# Patient Record
Sex: Female | Born: 1950 | Hispanic: No | Marital: Married | Smoking: Never smoker
Health system: Southern US, Community
[De-identification: ages and names within clinical notes are randomized; demographics above are authoritative.]

## PROBLEM LIST (undated history)

## (undated) DIAGNOSIS — M1990 Unspecified osteoarthritis, unspecified site: Secondary | ICD-10-CM

## (undated) DIAGNOSIS — K219 Gastro-esophageal reflux disease without esophagitis: Secondary | ICD-10-CM

## (undated) DIAGNOSIS — G8929 Other chronic pain: Secondary | ICD-10-CM

## (undated) DIAGNOSIS — C50911 Malignant neoplasm of unspecified site of right female breast: Secondary | ICD-10-CM

## (undated) DIAGNOSIS — R0602 Shortness of breath: Secondary | ICD-10-CM

## (undated) DIAGNOSIS — C50919 Malignant neoplasm of unspecified site of unspecified female breast: Secondary | ICD-10-CM

## (undated) DIAGNOSIS — R7303 Prediabetes: Secondary | ICD-10-CM

## (undated) DIAGNOSIS — M549 Dorsalgia, unspecified: Secondary | ICD-10-CM

## (undated) DIAGNOSIS — I10 Essential (primary) hypertension: Secondary | ICD-10-CM

## (undated) DIAGNOSIS — R011 Cardiac murmur, unspecified: Secondary | ICD-10-CM

## (undated) DIAGNOSIS — K635 Polyp of colon: Secondary | ICD-10-CM

## (undated) HISTORY — PX: TONSILLECTOMY: SUR1361

## (undated) HISTORY — DX: Unspecified osteoarthritis, unspecified site: M19.90

## (undated) HISTORY — PX: FOOT OSTEOTOMY: SHX957

## (undated) HISTORY — DX: Malignant neoplasm of unspecified site of right female breast: C50.911

## (undated) HISTORY — DX: Cardiac murmur, unspecified: R01.1

## (undated) HISTORY — DX: Essential (primary) hypertension: I10

## (undated) HISTORY — DX: Prediabetes: R73.03

## (undated) HISTORY — DX: Polyp of colon: K63.5

## (undated) HISTORY — PX: CHOLECYSTECTOMY: SHX55

## (undated) HISTORY — PX: COLONOSCOPY: SHX174

## (undated) HISTORY — DX: Shortness of breath: R06.02

---

## 1998-10-25 ENCOUNTER — Encounter: Admission: RE | Admit: 1998-10-25 | Discharge: 1998-11-11 | Payer: Self-pay | Admitting: Podiatry

## 2003-06-16 ENCOUNTER — Encounter: Admission: RE | Admit: 2003-06-16 | Discharge: 2003-06-16 | Payer: Self-pay | Admitting: Gastroenterology

## 2003-06-16 ENCOUNTER — Encounter: Payer: Self-pay | Admitting: Gastroenterology

## 2003-11-01 ENCOUNTER — Encounter: Admission: RE | Admit: 2003-11-01 | Discharge: 2003-11-30 | Payer: Self-pay | Admitting: Neurology

## 2006-04-08 ENCOUNTER — Ambulatory Visit (HOSPITAL_BASED_OUTPATIENT_CLINIC_OR_DEPARTMENT_OTHER): Admission: RE | Admit: 2006-04-08 | Discharge: 2006-04-08 | Payer: Self-pay | Admitting: Ophthalmology

## 2012-04-10 ENCOUNTER — Encounter: Payer: Self-pay | Admitting: Advanced Practice Midwife

## 2012-04-10 ENCOUNTER — Ambulatory Visit (INDEPENDENT_AMBULATORY_CARE_PROVIDER_SITE_OTHER): Payer: BC Managed Care – PPO | Admitting: Advanced Practice Midwife

## 2012-04-10 ENCOUNTER — Encounter: Payer: Self-pay | Admitting: Physician Assistant

## 2012-04-10 VITALS — BP 171/90 | HR 84 | Temp 97.7°F | Ht 60.0 in | Wt 143.2 lb

## 2012-04-10 DIAGNOSIS — M1990 Unspecified osteoarthritis, unspecified site: Secondary | ICD-10-CM

## 2012-04-10 DIAGNOSIS — I10 Essential (primary) hypertension: Secondary | ICD-10-CM

## 2012-04-10 DIAGNOSIS — Z01419 Encounter for gynecological examination (general) (routine) without abnormal findings: Secondary | ICD-10-CM

## 2012-04-10 DIAGNOSIS — M129 Arthropathy, unspecified: Secondary | ICD-10-CM

## 2012-04-10 NOTE — Patient Instructions (Signed)
Pap Test A Pap test is a sampling of cells from a woman's cervix. The cervix is the opening between the vagina (birth canal) and the uterus (the bottom part of the womb). The cells are scraped from the cervix during a pelvic exam. These cells are then looked at under a microscope to see if the cells are normal or to see if a cancer is developing or there are changes that suggest a cancer will develop. Cervical dysplasia is a condition in which a woman has abnormal changes in the top layer of cells of her cervix. These changes are an early sign that cervical cancer may develop. Pap tests also look for the human papilloma virus (HPV) because it has 4 types that are responsible for 70% of cervical cancer. Infections can also be found during a Pap test such as bacteria, fungus, protozoa and viruses.  Cervical cancer is harder to treat and less likely to have a good outcome if left untreated. Catching the disease at an early stage leads to a better outcome. Since the Pap test was introduced 60 years ago, deaths from cervical cancer have decreased by 70%. Every woman should keep up to date with Pap tests. RISK FACTORS FOR CERVICAL CANCER INCLUDE:   Becoming sexually active before age 18.   Being the daughter of a woman who took diethylstilbestrol (DES) during pregnancy.   Having a sexual partner who has or has had cancer of the penis.   Having a sexual partner whose past partner had cervical cancer or cervical dysplasia (early cell changes which suggest a cancer may develop).   Having a weakened immune system. An example would be HIV or other immunodeficiency disorder.   Having had a sexually transmitted infection such as chlamydia, gonorrhea or HPV.   Having had an abnormal Pap or cancer of the vagina or vulva.   Having had more than one sexual partner.   A history of cervical cancer in a woman's sister or mother.   Not using condoms with new sexual partners.   Smoking.  WHO SHOULD HAVE PAP  TESTS  A Pap test is done to screen for cervical cancer.   The first Pap test should be done at age 21.   Between ages 21 and 29, Pap tests are repeated every 2 years.   Beginning at age 30, you are advised to have a Pap test every 3 years as long as your past 3 Pap tests have been normal.   Some women have medical problems that increase the chance of getting cervical cancer. Talk to your caregiver about these problems. It is especially important to talk to your caregiver if a new problem develops soon after your last Pap test. In these cases, your caregiver may recommend more frequent screening and Pap tests.   The above recommendations are the same for women who have or have not gotten the vaccine for HPV (Human Papillomavirus).   If you had a hysterectomy for a problem that was not a cancer or a condition that could lead to cancer, then you no longer need Pap tests. However, even if you no longer need a Pap test, a regular exam is a good idea to make sure no other problems are starting.    If you are between ages 65 and 70, and you have had normal Pap tests going back 10 years, you no longer need Pap tests. However, even if you no longer need a Pap test, a regular exam is a good idea   to make sure no other problems are starting.    If you have had past treatment for cervical cancer or a condition that could lead to cancer, you need Pap tests and screening for cancer for at least 20 years after your treatment.   If Pap tests have been discontinued, risk factors (such as a new sexual partner) need to be re-assessed to determine if screening should be resumed.   Some women may need screenings more often if they are at high risk for cervical cancer.  PREPARATION FOR A PAP TEST A Pap test should be performed during the weeks before the start of menstruation. Women should not douche or have sexual intercourse for 24 hours before the test. No vaginal creams, diaphragms, or tampons should be  used for 24 hours before the test. To minimize discomfort, a woman should empty her bladder just before the exam. TAKING THE PAP TEST The caregiver will perform a pelvic exam. A metal or plastic instrument (speculum) is placed in the vagina. This is done before your caregiver does a bimanual exam of your internal female organs. This instrument allows your caregiver to see the inside of the vagina and look at the cervix. A small, sterile brush is used to take a sample of cells from the internal opening of the cervix. A small wooden spatula is used to scrape the outside of the cervix. Neither of these two methods to collect cells will cause you pain. These two scrapings are placed on a glass slide or in a small bottle filled with a special liquid. The cells are looked at later under a microscope in a lab. A specialist will look at these cells and determine if the cells are normal. RESULTS OF YOUR PAP TEST  A healthy Pap test shows no abnormal cells or evidence of inflammation.   The presence of abnormally growing cells on the surface of the cervix may be reported as an abnormal Pap test. Different categories of findings are used to describe your Pap test. Your caregiver will go over the importance of these findings with you. The caregiver will then determine what follow-up is needed or when you should have your next pap test.   If you have had two or more abnormal Pap tests:   You may be asked to have a colposcopy. This is a test in which the cervix is viewed with a special lighted microscope.   A cervical tissue sample (biopsy) may also be needed. This involves taking a small tissue sample from the cervix. The sample is looked at under a microscope to find the cause of the abnormal cells. Make sure you find out the results of the Pap test. If you have not received the results within two weeks, contact your caregiver's office for the results. Do not assume everything is normal if you have not heard from  your caregiver or medical facility. It is important to follow up on all of your test results.  Document Released: 01/05/2003 Document Revised: 10/04/2011 Document Reviewed: 10/09/2011 [employer] Patient Information 2012 [employer]. 

## 2012-04-11 ENCOUNTER — Encounter: Payer: Self-pay | Admitting: Advanced Practice Midwife

## 2012-04-11 NOTE — Progress Notes (Signed)
  Subjective:    Ashley Pratt is a 61 y.o. female who presents for an annual exam. The patient has no complaints today. The patient is not sexually active. GYN screening history: last pap: patient does not recall results of last pap. The patient wears seatbelts: yes. The patient participates in regular exercise: not asked. Has the patient ever been transfused or tattooed?: not asked. The patient reports that there is not domestic violence in her life.   Patient is followed by Medical doctor for her chronic health problems. She does not want a Mammogram "I had one a few years ago and don't want another".  Menstrual History: OB History    Grav Para Term Preterm Abortions TAB SAB Ect Mult Living   4 2 2  2  2   2        No LMP recorded. Patient is postmenopausal.    The following portions of the patient's history were reviewed and updated as appropriate: allergies, current medications, past family history, past medical history, past social history, past surgical history and problem list.  Review of Systems Pertinent items are noted in HPI.    Objective:    General appearance: alert, cooperative and no distress Throat: lips, mucosa, and tongue normal; teeth and gums normal Neck: no adenopathy, supple, symmetrical, trachea midline and thyroid not enlarged, symmetric, no tenderness/mass/nodules Lungs: clear to auscultation bilaterally Breasts: normal appearance, no masses or tenderness Heart: regular rate and rhythm, S1, S2 normal, no murmur, click, rub or gallop Abdomen: soft, non-tender; bowel sounds normal; no masses,  no organomegaly Pelvic: cervix normal in appearance, external genitalia normal, no adnexal masses or tenderness, no cervical motion tenderness, rectovaginal septum normal, uterus normal size, shape, and consistency and vagina normal without discharge.   Moderate atrophy noted Pap done  Assessment:    Healthy female exam.    Plan:     All questions  answered. Await pap smear results. Breast self exam technique reviewed and patient encouraged to perform self-exam monthly. Follow up in 1 year.  Declines Mammogram.

## 2012-04-21 ENCOUNTER — Encounter: Payer: Self-pay | Admitting: *Deleted

## 2012-10-29 HISTORY — PX: BREAST EXCISIONAL BIOPSY: SUR124

## 2013-01-06 ENCOUNTER — Other Ambulatory Visit: Payer: Self-pay | Admitting: Internal Medicine

## 2013-01-06 DIAGNOSIS — Z1231 Encounter for screening mammogram for malignant neoplasm of breast: Secondary | ICD-10-CM

## 2013-01-27 ENCOUNTER — Ambulatory Visit
Admission: RE | Admit: 2013-01-27 | Discharge: 2013-01-27 | Disposition: A | Discharge status: Home / Self Care | Payer: BC Managed Care – PPO | Source: Ambulatory Visit | Attending: Internal Medicine | Admitting: Internal Medicine

## 2013-01-27 ENCOUNTER — Other Ambulatory Visit: Payer: Self-pay | Admitting: Internal Medicine

## 2013-01-27 DIAGNOSIS — Z1231 Encounter for screening mammogram for malignant neoplasm of breast: Secondary | ICD-10-CM

## 2013-01-30 ENCOUNTER — Other Ambulatory Visit: Payer: Self-pay | Admitting: Internal Medicine

## 2013-01-30 DIAGNOSIS — R928 Other abnormal and inconclusive findings on diagnostic imaging of breast: Secondary | ICD-10-CM

## 2013-02-16 ENCOUNTER — Other Ambulatory Visit: Payer: BC Managed Care – PPO

## 2013-02-20 ENCOUNTER — Other Ambulatory Visit: Payer: Self-pay | Admitting: Internal Medicine

## 2013-02-20 ENCOUNTER — Ambulatory Visit
Admission: RE | Admit: 2013-02-20 | Discharge: 2013-02-20 | Disposition: A | Discharge status: Home / Self Care | Payer: BC Managed Care – PPO | Source: Ambulatory Visit | Attending: Internal Medicine | Admitting: Internal Medicine

## 2013-02-20 DIAGNOSIS — R928 Other abnormal and inconclusive findings on diagnostic imaging of breast: Secondary | ICD-10-CM

## 2013-02-26 ENCOUNTER — Ambulatory Visit
Admission: RE | Admit: 2013-02-26 | Discharge: 2013-02-26 | Disposition: A | Discharge status: Home / Self Care | Payer: BC Managed Care – PPO | Source: Ambulatory Visit | Attending: Internal Medicine | Admitting: Internal Medicine

## 2013-02-26 ENCOUNTER — Other Ambulatory Visit: Payer: Self-pay | Admitting: Internal Medicine

## 2013-02-26 DIAGNOSIS — R928 Other abnormal and inconclusive findings on diagnostic imaging of breast: Secondary | ICD-10-CM

## 2013-02-27 ENCOUNTER — Other Ambulatory Visit: Payer: Self-pay | Admitting: Internal Medicine

## 2013-02-27 DIAGNOSIS — R928 Other abnormal and inconclusive findings on diagnostic imaging of breast: Secondary | ICD-10-CM

## 2013-03-10 ENCOUNTER — Ambulatory Visit
Admission: RE | Admit: 2013-03-10 | Discharge: 2013-03-10 | Disposition: A | Discharge status: Home / Self Care | Payer: BC Managed Care – PPO | Source: Ambulatory Visit | Attending: Internal Medicine | Admitting: Internal Medicine

## 2013-03-10 ENCOUNTER — Other Ambulatory Visit: Payer: Self-pay | Admitting: Internal Medicine

## 2013-03-10 DIAGNOSIS — R928 Other abnormal and inconclusive findings on diagnostic imaging of breast: Secondary | ICD-10-CM

## 2013-03-16 ENCOUNTER — Telehealth (INDEPENDENT_AMBULATORY_CARE_PROVIDER_SITE_OTHER): Payer: Self-pay

## 2013-03-16 ENCOUNTER — Ambulatory Visit (INDEPENDENT_AMBULATORY_CARE_PROVIDER_SITE_OTHER): Payer: Self-pay | Admitting: General Surgery

## 2013-03-16 NOTE — Telephone Encounter (Signed)
LM on husband's VM.  Pt to call back and reschedule 03/16/13 no show.

## 2013-03-30 ENCOUNTER — Ambulatory Visit (INDEPENDENT_AMBULATORY_CARE_PROVIDER_SITE_OTHER): Payer: Self-pay | Admitting: General Surgery

## 2013-04-27 ENCOUNTER — Encounter (INDEPENDENT_AMBULATORY_CARE_PROVIDER_SITE_OTHER): Payer: Self-pay | Admitting: General Surgery

## 2013-04-27 ENCOUNTER — Ambulatory Visit (INDEPENDENT_AMBULATORY_CARE_PROVIDER_SITE_OTHER): Payer: BC Managed Care – PPO | Admitting: General Surgery

## 2013-04-27 VITALS — BP 128/84 | HR 61 | Temp 97.4°F | Resp 16 | Ht 60.0 in | Wt 143.0 lb

## 2013-04-27 DIAGNOSIS — D0500 Lobular carcinoma in situ of unspecified breast: Secondary | ICD-10-CM | POA: Insufficient documentation

## 2013-04-27 DIAGNOSIS — C50919 Malignant neoplasm of unspecified site of unspecified female breast: Secondary | ICD-10-CM

## 2013-04-27 DIAGNOSIS — D0501 Lobular carcinoma in situ of right breast: Secondary | ICD-10-CM

## 2013-04-27 NOTE — Progress Notes (Signed)
Chief complaint:  Right breast LCIS.  HISTORY: Pt is a 62 yo female who had abnormal mammogram.  Core Needle biopsy was performed and demonstrated LCIS with extensive necrosis and calcifications.  She has never needed a breast biopsy before.  She has had 2 children, the first at age 26.  She had menopause around age 82.  She denies family or personal history of cancer.  She denies nipple discharge or retraction.  She has not had breast pain other than with biopsy.  She is not on any hormones.    Past Medical History  Diagnosis Date  . Hypertension   . Heart murmur     Past Surgical History  Procedure Laterality Date  . Cholecystectomy      Current Outpatient Prescriptions  Medication Sig Dispense Refill  . amoxicillin (AMOXIL) 500 MG capsule       . chlorthalidone (HYGROTON) 25 MG tablet Take 25 mg by mouth daily.      Marland Kitchen losartan (COZAAR) 25 MG tablet Take 25 mg by mouth daily.       No current facility-administered medications for this visit.     Allergies  Allergen Reactions  . Aspirin      No family history on file.   History   Social History  . Marital Status: Married    Spouse Name: N/A    Number of Children: N/A  . Years of Education: N/A   Social History Main Topics  . Smoking status: Never Smoker   . Smokeless tobacco: Never Used  . Alcohol Use: No  . Drug Use: No  . Sexually Active: None   REVIEW OF SYSTEMS - PERTINENT POSITIVES ONLY: 12 point review of systems negative other than HPI and PMH  EXAM: Filed Vitals:   04/27/13 1121  BP: 128/84  Pulse: 61  Temp: 97.4 F (36.3 C)  Resp: 16   Filed Weights   04/27/13 1121  Weight: 143 lb (64.864 kg)    Gen:  No acute distress.  Well nourished and well groomed.   Neurological: Alert and oriented to person, place, and time. Coordination normal.  Head: Normocephalic and atraumatic.  Eyes: Conjunctivae are normal. Pupils are equal, round, and reactive to light. No scleral icterus.  Neck: Normal  range of motion. Neck supple. No tracheal deviation or thyromegaly present.  Cardiovascular: Normal rate, regular rhythm, normal heart sounds and intact distal pulses.  Exam reveals no gallop and no friction rub.  No murmur heard. Breasts:  No palpable masses or tenderness.  No nipple discharge or retraction.  Biopsy site seen at 9 oclock.   Respiratory: Effort normal.  No respiratory distress. No chest wall tenderness. Breath sounds normal.  No wheezes, rales or rhonchi.  GI: Soft. Bowel sounds are normal. The abdomen is soft and nontender.  There is no rebound and no guarding.  Musculoskeletal: Normal range of motion. Extremities are nontender.  Lymphadenopathy: No cervical, preauricular, postauricular or axillary adenopathy is present Skin: Skin is warm and dry. No rash noted. No diaphoresis. No erythema. No pallor. No clubbing, cyanosis, or edema.   Psychiatric: Normal mood and affect. Behavior is normal. Judgment and thought content normal.    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  Images and reports are reviewed. Diagnostic mammogram.  Magnification views of the outer portion of the left  breast  demonstrate coarse calcifications felt to be benign dystrophic  calcifications. There are several partially circumscribed  partially obscured round nodules in the left lower outer quadrant.  Mammographic images were processed with CAD.  On physical exam, no mass is palpated in either breast.  Ultrasound is performed, showing a cyst at 1 o'clock 3 cm from the  left nipple measuring 8 x 10 x 5 mm. There are two cysts in the  left lower outer quadrant, one at 4 o'clock 5 cm from the left  nipple measuring 7 x 7 x 5 mm and one at 6 o'clock 6 cm from the  left nipple measuring 6 x 4 x  4 mm.  On the right at 11 o'clock, 4 cm from the right nipple, there is an  ill-defined hypoechoic area in a ductal orientation that may  contain calcifications. This measures 2.1 x 0.7 x 2.2 cm. At the  distal end of this hypoechoic area, there is another hypoechoic  area that may contain calcifications, 6 cm from the right nipple at  11 o'clock measuring 9 x 4 x 6 mm. Findings are concerning for  possible carcinoma.  I would suggest ultrasound-guided core needle biopsy of the  hypoechoic area at 11 o'clock 4 cm from the right nipple. This  could be followed by stereotactic core needle biopsy of the  calcifications. These procedures were discussed with the patient  and her husband. They could not stay today for these procedures.  This has been scheduled for 02/26/2013 at 2:00 p.m.  IMPRESSION:  Suspicious hypoechoic mass at 11 o'clock, 4 cm from the right  nipple with suspicious calcifications in the outer portion of the  right breast.  RECOMMENDATION:  Suggest ultrasound-guided core needle biopsy of the hypoechoic area  and stereotactic core needle biopsy of the calcifications. This  has been scheduled for 02/26/2013 at 2:00 p.m.  I have discussed the findings and recommendations with the patient.  Results were also provided in writing at the conclusion of the  visit. If applicable, a reminder letter will be sent to the  patient regarding the next appointment.  BI-RADS CATEGORY 4: Suspicious abnormality - biopsy should be  considered.    ASSESSMENT AND PLAN: Breast neoplasm, Tis (LCIS) Given extensive necrosis and calcifications associated with LCIS, will plan needle localization.  Reviewed surgery, risks and benefits with patient.    Discussed bleeding, infection, seroma, damage to adjacent structures, possible diagnosis of cancer, possible need for further procedures or surgery.  Advised not to take aspirin or blood thinners 1 week before surgery.       Maudry Diego  MD Surgical Oncology, General and Endocrine Surgery [facility]      Visit Diagnoses: 1. Breast neoplasm, Tis (LCIS), right     Primary Care Physician: Georgann Housekeeper, MD

## 2013-04-27 NOTE — Patient Instructions (Signed)
IF YOU ARE TAKING ASPIRIN, COUMADIN/WARFARIN, PLAVIX, OR OTHER BLOOD THINNER, PLEASE LET US KNOW IMMEDIATELY.  WE WILL NEED TO DISCUSS WITH THE PRESCRIBING PROVIDER IF THESE ARE SAFE TO STOP. IF THESE ARE NOT STOPPED AT THE APPROPRIATE TIME, THIS WILL RESULT IN A DELAY FOR YOUR SURGERY.  DO NOT TAKE THESE MEDICATIONS OR IBUPROFEN/NAPROXEN WITHIN A WEEK BEFORE SURGERY.   The main risks of surgery are bleeding, infection, damage to other structures, and seroma (accumulation of fluid) under the incision site(s).    These complications may lead to additional procedures such as drainage of seroma/infection.  If cancer is found, you may need other surgeries to obtain negative margins or to take more lymph nodes.   Most women do accumulate fluid in the breast cavity where the specimen was removed. We do not always have to drain this fluid.  If your breast is very tense, painful, or red, then we may need to numb the skin and use a needle to aspirate the fluid.  We do provide patients with a Breast Binder.  The purpose of this is to avoid the use of tape on the sensitive tissue of the breast and to provide some compression to minimize the risk of seroma.  If the binder is uncomfortable, you may find that a tank top with a built-in shelf bra or a loose sports bra works better for you.  I recommend wearing this around the clock for the first 1-2 weeks except in the shower.    You may remove your dressings and may shower 48 hours after surgery.    Many patients have some constipation in the week after surgery due to the narcotics and anesthesia.  You may need over the counter stool softeners or laxatives if you experience difficulty having bowel movements.    If the following occur, call our office at [phone]: If you have a fever over 101 or pain that is severe despite narcotics. If you have redness or drainage at the wound. If you develop persistent nausea or vomiting.  I will follow you back up in  1-4 weeks.    Please submit any paperwork about time off work/insurance forms to the front desk.      

## 2013-04-27 NOTE — Assessment & Plan Note (Signed)
Given extensive necrosis and calcifications associated with LCIS, will plan needle localization.  Reviewed surgery, risks and benefits with patient.    Discussed bleeding, infection, seroma, damage to adjacent structures, possible diagnosis of cancer, possible need for further procedures or surgery.  Advised not to take aspirin or blood thinners 1 week before surgery.

## 2013-05-06 ENCOUNTER — Encounter (HOSPITAL_BASED_OUTPATIENT_CLINIC_OR_DEPARTMENT_OTHER): Payer: Self-pay | Admitting: *Deleted

## 2013-05-06 NOTE — Progress Notes (Signed)
Spoke with husband-says wife speaks english fairly well-but will get interpretor-to come in for ekg-bmet-

## 2013-05-07 ENCOUNTER — Encounter (HOSPITAL_BASED_OUTPATIENT_CLINIC_OR_DEPARTMENT_OTHER)
Admission: RE | Admit: 2013-05-07 | Discharge: 2013-05-07 | Disposition: A | Discharge status: Home / Self Care | Payer: BC Managed Care – PPO | Source: Ambulatory Visit | Attending: General Surgery | Admitting: General Surgery

## 2013-05-07 ENCOUNTER — Other Ambulatory Visit: Payer: Self-pay

## 2013-05-07 LAB — BASIC METABOLIC PANEL
BUN: 13 mg/dL (ref 6–23)
CO2: 28 mEq/L (ref 19–32)
Chloride: 98 mEq/L (ref 96–112)
Creatinine, Ser: 0.86 mg/dL (ref 0.50–1.10)
Glucose, Bld: 146 mg/dL — ABNORMAL HIGH (ref 70–99)

## 2013-05-11 ENCOUNTER — Encounter (HOSPITAL_BASED_OUTPATIENT_CLINIC_OR_DEPARTMENT_OTHER): Payer: Self-pay | Admitting: Certified Registered Nurse Anesthetist

## 2013-05-11 ENCOUNTER — Ambulatory Visit (HOSPITAL_BASED_OUTPATIENT_CLINIC_OR_DEPARTMENT_OTHER): Payer: BC Managed Care – PPO | Admitting: Certified Registered Nurse Anesthetist

## 2013-05-11 ENCOUNTER — Ambulatory Visit
Admission: RE | Admit: 2013-05-11 | Discharge: 2013-05-11 | Disposition: A | Discharge status: Home / Self Care | Payer: BC Managed Care – PPO | Source: Ambulatory Visit | Attending: General Surgery | Admitting: General Surgery

## 2013-05-11 ENCOUNTER — Ambulatory Visit (HOSPITAL_BASED_OUTPATIENT_CLINIC_OR_DEPARTMENT_OTHER)
Admission: RE | Admit: 2013-05-11 | Discharge: 2013-05-11 | Disposition: A | Discharge status: Home / Self Care | Payer: BC Managed Care – PPO | Source: Ambulatory Visit | Attending: General Surgery | Admitting: General Surgery

## 2013-05-11 ENCOUNTER — Encounter (HOSPITAL_BASED_OUTPATIENT_CLINIC_OR_DEPARTMENT_OTHER)
Admission: RE | Disposition: A | Discharge status: Home / Self Care | Payer: Self-pay | Source: Ambulatory Visit | Attending: General Surgery

## 2013-05-11 DIAGNOSIS — R011 Cardiac murmur, unspecified: Secondary | ICD-10-CM | POA: Insufficient documentation

## 2013-05-11 DIAGNOSIS — D0501 Lobular carcinoma in situ of right breast: Secondary | ICD-10-CM

## 2013-05-11 DIAGNOSIS — I10 Essential (primary) hypertension: Secondary | ICD-10-CM | POA: Insufficient documentation

## 2013-05-11 DIAGNOSIS — D4860 Neoplasm of uncertain behavior of unspecified breast: Secondary | ICD-10-CM

## 2013-05-11 DIAGNOSIS — M129 Arthropathy, unspecified: Secondary | ICD-10-CM | POA: Insufficient documentation

## 2013-05-11 DIAGNOSIS — Z780 Asymptomatic menopausal state: Secondary | ICD-10-CM | POA: Insufficient documentation

## 2013-05-11 DIAGNOSIS — D0590 Unspecified type of carcinoma in situ of unspecified breast: Secondary | ICD-10-CM | POA: Insufficient documentation

## 2013-05-11 DIAGNOSIS — Z79899 Other long term (current) drug therapy: Secondary | ICD-10-CM | POA: Insufficient documentation

## 2013-05-11 DIAGNOSIS — K219 Gastro-esophageal reflux disease without esophagitis: Secondary | ICD-10-CM | POA: Insufficient documentation

## 2013-05-11 HISTORY — PX: BREAST LUMPECTOMY WITH NEEDLE LOCALIZATION: SHX5759

## 2013-05-11 HISTORY — DX: Unspecified osteoarthritis, unspecified site: M19.90

## 2013-05-11 HISTORY — PX: BREAST LUMPECTOMY: SHX2

## 2013-05-11 HISTORY — DX: Gastro-esophageal reflux disease without esophagitis: K21.9

## 2013-05-11 HISTORY — DX: Other chronic pain: G89.29

## 2013-05-11 HISTORY — DX: Dorsalgia, unspecified: M54.9

## 2013-05-11 LAB — POCT HEMOGLOBIN-HEMACUE: Hemoglobin: 13.9 g/dL (ref 12.0–15.0)

## 2013-05-11 SURGERY — BREAST LUMPECTOMY WITH NEEDLE LOCALIZATION
Anesthesia: General | Site: Breast | Laterality: Right | Wound class: Clean

## 2013-05-11 MED ORDER — SODIUM CHLORIDE 0.9 % IJ SOLN: 3.0000 mL | INTRAMUSCULAR | Status: DC | PRN

## 2013-05-11 MED ORDER — FENTANYL CITRATE 0.05 MG/ML IJ SOLN
INTRAMUSCULAR | Status: DC | PRN
  Administered 2013-05-11: 50 ug via INTRAVENOUS
  Administered 2013-05-11: 25 ug via INTRAVENOUS

## 2013-05-11 MED ORDER — LIDOCAINE HCL (CARDIAC) 20 MG/ML IV SOLN
INTRAVENOUS | Status: DC | PRN
  Administered 2013-05-11: 50 mg via INTRAVENOUS

## 2013-05-11 MED ORDER — ACETAMINOPHEN 650 MG RE SUPP: 650.0000 mg | RECTAL | Status: DC | PRN

## 2013-05-11 MED ORDER — PROPOFOL 10 MG/ML IV BOLUS
INTRAVENOUS | Status: DC | PRN
  Administered 2013-05-11: 150 mg via INTRAVENOUS

## 2013-05-11 MED ORDER — OXYCODONE HCL 5 MG/5ML PO SOLN: 5.0000 mg | Freq: Once | ORAL | Status: AC | PRN

## 2013-05-11 MED ORDER — FENTANYL CITRATE 0.05 MG/ML IJ SOLN: 25.0000 ug | INTRAMUSCULAR | Status: DC | PRN

## 2013-05-11 MED ORDER — OXYCODONE HCL 5 MG PO TABS: 5.0000 mg | ORAL_TABLET | ORAL | Status: DC | PRN

## 2013-05-11 MED ORDER — SCOPOLAMINE 1 MG/3DAYS TD PT72
1.0000 | MEDICATED_PATCH | TRANSDERMAL | Status: DC
  Administered 2013-05-11: 1.5 mg via TRANSDERMAL

## 2013-05-11 MED ORDER — ONDANSETRON HCL 4 MG/2ML IJ SOLN
INTRAMUSCULAR | Status: DC | PRN
  Administered 2013-05-11: 4 mg via INTRAVENOUS

## 2013-05-11 MED ORDER — HYDROCODONE-ACETAMINOPHEN 5-325 MG PO TABS: 1.0000 | ORAL_TABLET | ORAL | Status: DC | PRN

## 2013-05-11 MED ORDER — ACETAMINOPHEN 325 MG PO TABS: 650.0000 mg | ORAL_TABLET | ORAL | Status: DC | PRN

## 2013-05-11 MED ORDER — SODIUM CHLORIDE 0.9 % IJ SOLN: 3.0000 mL | Freq: Two times a day (BID) | INTRAMUSCULAR | Status: DC

## 2013-05-11 MED ORDER — OXYCODONE HCL 5 MG PO TABS
5.0000 mg | ORAL_TABLET | Freq: Once | ORAL | Status: AC | PRN
  Administered 2013-05-11: 5 mg via ORAL

## 2013-05-11 MED ORDER — DEXAMETHASONE SODIUM PHOSPHATE 4 MG/ML IJ SOLN
INTRAMUSCULAR | Status: DC | PRN
  Administered 2013-05-11: 10 mg via INTRAVENOUS

## 2013-05-11 MED ORDER — LIDOCAINE-EPINEPHRINE (PF) 1 %-1:200000 IJ SOLN
INTRAMUSCULAR | Status: DC | PRN
  Administered 2013-05-11: 16:00:00

## 2013-05-11 MED ORDER — SODIUM CHLORIDE 0.9 % IV SOLN: 250.0000 mL | INTRAVENOUS | Status: DC | PRN

## 2013-05-11 MED ORDER — CEFAZOLIN SODIUM-DEXTROSE 2-3 GM-% IV SOLR
2.0000 g | INTRAVENOUS | Status: AC
  Administered 2013-05-11: 2 g via INTRAVENOUS

## 2013-05-11 MED ORDER — LACTATED RINGERS IV SOLN
INTRAVENOUS | Status: DC
  Administered 2013-05-11: 13:00:00 via INTRAVENOUS

## 2013-05-11 MED ORDER — EPHEDRINE SULFATE 50 MG/ML IJ SOLN
INTRAMUSCULAR | Status: DC | PRN
  Administered 2013-05-11: 10 mg via INTRAVENOUS

## 2013-05-11 MED ORDER — ONDANSETRON HCL 4 MG/2ML IJ SOLN: 4.0000 mg | Freq: Four times a day (QID) | INTRAMUSCULAR | Status: DC | PRN

## 2013-05-11 MED ORDER — MIDAZOLAM HCL 5 MG/5ML IJ SOLN
INTRAMUSCULAR | Status: DC | PRN
  Administered 2013-05-11: 1 mg via INTRAVENOUS

## 2013-05-11 MED ORDER — CHLORHEXIDINE GLUCONATE 4 % EX LIQD: 1.0000 "application " | Freq: Once | CUTANEOUS | Status: DC

## 2013-05-11 SURGICAL SUPPLY — 47 items
BLADE HEX COATED 2.75 (ELECTRODE) ×2 IMPLANT
BLADE SURG 15 STRL LF DISP TIS (BLADE) ×1 IMPLANT
BLADE SURG 15 STRL SS (BLADE) ×2
CANISTER SUCTION 1200CC (MISCELLANEOUS) ×2 IMPLANT
CHLORAPREP W/TINT 26ML (MISCELLANEOUS) ×2 IMPLANT
CLIP TI LARGE 6 (CLIP) ×1 IMPLANT
CLOTH BEACON ORANGE TIMEOUT ST (SAFETY) ×2 IMPLANT
COVER MAYO STAND STRL (DRAPES) ×2 IMPLANT
COVER TABLE BACK 60X90 (DRAPES) ×2 IMPLANT
DECANTER SPIKE VIAL GLASS SM (MISCELLANEOUS) IMPLANT
DEVICE DUBIN W/COMP PLATE 8390 (MISCELLANEOUS) ×2 IMPLANT
DRAPE PED LAPAROTOMY (DRAPES) ×2 IMPLANT
DRAPE UTILITY XL STRL (DRAPES) ×2 IMPLANT
ELECT REM PT RETURN 9FT ADLT (ELECTROSURGICAL) ×2
ELECTRODE REM PT RTRN 9FT ADLT (ELECTROSURGICAL) ×1 IMPLANT
GAUZE SPONGE 4X4 12PLY STRL LF (GAUZE/BANDAGES/DRESSINGS) ×2 IMPLANT
GLOVE BIO SURGEON STRL SZ 6 (GLOVE) ×2 IMPLANT
GLOVE BIOGEL PI IND STRL 6.5 (GLOVE) ×1 IMPLANT
GLOVE BIOGEL PI IND STRL 7.0 (GLOVE) IMPLANT
GLOVE BIOGEL PI INDICATOR 6.5 (GLOVE) ×1
GLOVE BIOGEL PI INDICATOR 7.0 (GLOVE) ×1
GLOVE ECLIPSE 6.5 STRL STRAW (GLOVE) ×1 IMPLANT
GOWN PREVENTION PLUS XLARGE (GOWN DISPOSABLE) ×2 IMPLANT
GOWN PREVENTION PLUS XXLARGE (GOWN DISPOSABLE) ×2 IMPLANT
KIT MARKER MARGIN INK (KITS) IMPLANT
NDL HYPO 25X1 1.5 SAFETY (NEEDLE) ×1 IMPLANT
NEEDLE HYPO 25X1 1.5 SAFETY (NEEDLE) ×2 IMPLANT
NS IRRIG 1000ML POUR BTL (IV SOLUTION) ×2 IMPLANT
PACK BASIN DAY SURGERY FS (CUSTOM PROCEDURE TRAY) ×2 IMPLANT
PENCIL BUTTON HOLSTER BLD 10FT (ELECTRODE) ×2 IMPLANT
SLEEVE SCD COMPRESS KNEE MED (MISCELLANEOUS) ×2 IMPLANT
SPONGE GAUZE 4X4 16PLY UNSTER (WOUND CARE) ×4 IMPLANT
SPONGE LAP 18X18 X RAY DECT (DISPOSABLE) ×2 IMPLANT
STAPLER VISISTAT 35W (STAPLE) IMPLANT
STRIP CLOSURE SKIN 1/2X4 (GAUZE/BANDAGES/DRESSINGS) ×2 IMPLANT
SUT MON AB 4-0 PC3 18 (SUTURE) ×2 IMPLANT
SUT SILK 2 0 SH (SUTURE) IMPLANT
SUT VIC AB 3-0 54X BRD REEL (SUTURE) IMPLANT
SUT VIC AB 3-0 BRD 54 (SUTURE)
SUT VIC AB 3-0 SH 27 (SUTURE) ×2
SUT VIC AB 3-0 SH 27X BRD (SUTURE) ×1 IMPLANT
SYR BULB 3OZ (MISCELLANEOUS) ×2 IMPLANT
SYR CONTROL 10ML LL (SYRINGE) ×2 IMPLANT
TOWEL OR 17X24 6PK STRL BLUE (TOWEL DISPOSABLE) ×2 IMPLANT
TOWEL OR NON WOVEN STRL DISP B (DISPOSABLE) ×2 IMPLANT
TUBE CONNECTING 20X1/4 (TUBING) ×2 IMPLANT
YANKAUER SUCT BULB TIP NO VENT (SUCTIONS) ×2 IMPLANT

## 2013-05-11 NOTE — Anesthesia Postprocedure Evaluation (Signed)
Anesthesia Post Note  Patient: Ashley Pratt  Procedure(s) Performed: Procedure(s) (LRB): RIGHT BREAST NEEDLE LOCALIZATION  LUMPECTOMY (Right)  Anesthesia type: General  Patient location: PACU  Post pain: Pain level controlled and Adequate analgesia  Post assessment: Post-op Vital signs reviewed, Patient's Cardiovascular Status Stable, Respiratory Function Stable, Patent Airway and Pain level controlled  Last Vitals:  Filed Vitals:   05/11/13 1615  BP: 155/85  Pulse: 93  Temp:   Resp: 13    Post vital signs: Reviewed and stable  Level of consciousness: awake, alert  and oriented  Complications: No apparent anesthesia complications

## 2013-05-11 NOTE — Transfer of Care (Signed)
Immediate Anesthesia Transfer of Care Note  Patient: Ashley Pratt  Procedure(s) Performed: Procedure(s): RIGHT BREAST NEEDLE LOCALIZATION  LUMPECTOMY (Right)  Patient Location: PACU  Anesthesia Type:General  Level of Consciousness: awake and sedated  Airway & Oxygen Therapy: Patient Spontanous Breathing and Patient connected to face mask oxygen  Post-op Assessment: Report given to PACU RN and Post -op Vital signs reviewed and stable  Post vital signs: Reviewed and stable  Complications: No apparent anesthesia complications

## 2013-05-11 NOTE — Op Note (Signed)
Right breast needle localized lumpectomy  Indications: This patient presents with history of a right breast mass. Given the clinical history and physical exam, along with indicated diagnostic studies, breast biopsy will be performed.  Pre-operative Diagnosis: right breast mass  Post-operative Diagnosis: right breast mass  Surgeon: [email or web address]   Assistants: none  Anesthesia: General LMA anesthesia and Local anesthesia 1% plain lidocaine, 0.25.% bupivacaine, with epinephrine  ASA Class: 2  Procedure Details  The patient was seen in the Holding Room. The risks, benefits, complications, treatment options, and expected outcomes were discussed with the patient. The possibilities of reaction to medication, pulmonary aspiration, bleeding, infection, the need for additional procedures, failure to diagnose a condition, and creating a complication requiring transfusion or operation were discussed with the patient. The patient concurred with the proposed plan, giving informed consent. The site of surgery properly noted/marked. The patient was taken to Operating Room, identified as LAQUINTA HAZELL, and the procedure verified as lumpectomy. A Time Out was held and the above information confirmed.  After induction of anesthesia, the right breast and chest were prepped and draped in standard fashion. The lumpectomy was performed by creating an transverse incision over the lateral aspect of the breast. Thick flaps were created superiorly and anteriorly.  The wire was dissected out and held in place with a hemostat while the wire was pulled back under the skin.  Allis clamps were used to elevate the specimen.  The cautery and the curved mayo scissors were used to dissect out the specimen.  Orientation sutures were placed, long lateral and short superior, and hemostasis was achieved with cautery. The wound was irrigated and closed with a 3-0 Vicryl deep dermal and 4-0 monocryl subcuticular closure in layers.     Sterile dressings were applied. At the end of the operation, all sponge, instrument, and needle counts were correct.  Findings: grossly clear surgical margins  Estimated Blood Loss:  Minimal         Specimens:  Right breast lumpectomy             Complications:  None; patient tolerated the procedure well.         Disposition: PACU - hemodynamically stable.         Condition: Stable

## 2013-05-11 NOTE — Interval H&P Note (Signed)
History and Physical Interval Note:  05/11/2013 2:54 PM  Ashley Pratt  has presented today for surgery, with the diagnosis of right LCIS with necrosis  The various methods of treatment have been discussed with the patient and family. After consideration of risks, benefits and other options for treatment, the patient has consented to  Procedure(s): RIGHT BREAST NEEDLE LOCALIZATION  LUMPECTOMY (Right) as a surgical intervention .  The patient's history has been reviewed, patient examined, no change in status, stable for surgery.  I have reviewed the patient's chart and labs.  Questions were answered to the patient's satisfaction.     [name]

## 2013-05-11 NOTE — Anesthesia Preprocedure Evaluation (Addendum)
Anesthesia Evaluation  Patient identified by MRN, date of birth, ID band Patient awake    Reviewed: Allergy & Precautions, H&P , NPO status , Patient's Chart, lab work & pertinent test results  Airway Mallampati: II  Neck ROM: full    Dental   Pulmonary          Cardiovascular hypertension, + Valvular Problems/Murmurs     Neuro/Psych    GI/Hepatic GERD-  Medicated,  Endo/Other    Renal/GU      Musculoskeletal  (+) Arthritis -,   Abdominal   Peds  Hematology   Anesthesia Other Findings   Reproductive/Obstetrics                          Anesthesia Physical Anesthesia Plan  ASA: II  Anesthesia Plan: General   Post-op Pain Management:    Induction: Intravenous  Airway Management Planned: LMA  Additional Equipment:   Intra-op Plan:   Post-operative Plan:   Informed Consent: I have reviewed the patients History and Physical, chart, labs and discussed the procedure including the risks, benefits and alternatives for the proposed anesthesia with the patient or authorized representative who has indicated his/her understanding and acceptance.     Plan Discussed with: CRNA, Anesthesiologist and Surgeon  Anesthesia Plan Comments:         Anesthesia Quick Evaluation

## 2013-05-11 NOTE — H&P (View-Only) (Signed)
Chief complaint:  Right breast LCIS.  HISTORY: Pt is a 61 yo female who had abnormal mammogram.  Core Needle biopsy was performed and demonstrated LCIS with extensive necrosis and calcifications.  She has never needed a breast biopsy before.  She has had 2 children, the first at age 27.  She had menopause around age 41.  She denies family or personal history of cancer.  She denies nipple discharge or retraction.  She has not had breast pain other than with biopsy.  She is not on any hormones.    Past Medical History  Diagnosis Date  . Hypertension   . Heart murmur     Past Surgical History  Procedure Laterality Date  . Cholecystectomy      Current Outpatient Prescriptions  Medication Sig Dispense Refill  . amoxicillin (AMOXIL) 500 MG capsule       . chlorthalidone (HYGROTON) 25 MG tablet Take 25 mg by mouth daily.      . losartan (COZAAR) 25 MG tablet Take 25 mg by mouth daily.       No current facility-administered medications for this visit.     Allergies  Allergen Reactions  . Aspirin      No family history on file.   History   Social History  . Marital Status: Married    Spouse Name: N/A    Number of Children: N/A  . Years of Education: N/A   Social History Main Topics  . Smoking status: Never Smoker   . Smokeless tobacco: Never Used  . Alcohol Use: No  . Drug Use: No  . Sexually Active: None   REVIEW OF SYSTEMS - PERTINENT POSITIVES ONLY: 12 point review of systems negative other than HPI and PMH  EXAM: Filed Vitals:   04/27/13 1121  BP: 128/84  Pulse: 61  Temp: 97.4 F (36.3 C)  Resp: 16   Filed Weights   04/27/13 1121  Weight: 143 lb (64.864 kg)    Gen:  No acute distress.  Well nourished and well groomed.   Neurological: Alert and oriented to person, place, and time. Coordination normal.  Head: Normocephalic and atraumatic.  Eyes: Conjunctivae are normal. Pupils are equal, round, and reactive to light. No scleral icterus.  Neck: Normal  range of motion. Neck supple. No tracheal deviation or thyromegaly present.  Cardiovascular: Normal rate, regular rhythm, normal heart sounds and intact distal pulses.  Exam reveals no gallop and no friction rub.  No murmur heard. Breasts:  No palpable masses or tenderness.  No nipple discharge or retraction.  Biopsy site seen at 9 oclock.   Respiratory: Effort normal.  No respiratory distress. No chest wall tenderness. Breath sounds normal.  No wheezes, rales or rhonchi.  GI: Soft. Bowel sounds are normal. The abdomen is soft and nontender.  There is no rebound and no guarding.  Musculoskeletal: Normal range of motion. Extremities are nontender.  Lymphadenopathy: No cervical, preauricular, postauricular or axillary adenopathy is present Skin: Skin is warm and dry. No rash noted. No diaphoresis. No erythema. No pallor. No clubbing, cyanosis, or edema.   Psychiatric: Normal mood and affect. Behavior is normal. Judgment and thought content normal.    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  Images and reports are reviewed. Diagnostic mammogram.  Magnification views of the outer portion of the left   breast  demonstrate coarse calcifications felt to be benign dystrophic  calcifications. There are several partially circumscribed  partially obscured round nodules in the left lower outer quadrant.  Mammographic images were processed with CAD.  On physical exam, no mass is palpated in either breast.  Ultrasound is performed, showing a cyst at 1 o'clock 3 cm from the  left nipple measuring 8 x 10 x 5 mm. There are two cysts in the  left lower outer quadrant, one at 4 o'clock 5 cm from the left  nipple measuring 7 x 7 x 5 mm and one at 6 o'clock 6 cm from the  left nipple measuring 6 x 4 x  4 mm.  On the right at 11 o'clock, 4 cm from the right nipple, there is an  ill-defined hypoechoic area in a ductal orientation that may  contain calcifications. This measures 2.1 x 0.7 x 2.2 cm. At the  distal end of this hypoechoic area, there is another hypoechoic  area that may contain calcifications, 6 cm from the right nipple at  11 o'clock measuring 9 x 4 x 6 mm. Findings are concerning for  possible carcinoma.  I would suggest ultrasound-guided core needle biopsy of the  hypoechoic area at 11 o'clock 4 cm from the right nipple. This  could be followed by stereotactic core needle biopsy of the  calcifications. These procedures were discussed with the patient  and her husband. They could not stay today for these procedures.  This has been scheduled for 02/26/2013 at 2:00 p.m.  IMPRESSION:  Suspicious hypoechoic mass at 11 o'clock, 4 cm from the right  nipple with suspicious calcifications in the outer portion of the  right breast.  RECOMMENDATION:  Suggest ultrasound-guided core needle biopsy of the hypoechoic area  and stereotactic core needle biopsy of the calcifications. This  has been scheduled for 02/26/2013 at 2:00 p.m.  I have discussed the findings and recommendations with the patient.  Results were also provided in writing at the conclusion of the  visit. If applicable, a reminder letter will be sent to the  patient regarding the next appointment.  BI-RADS CATEGORY 4: Suspicious abnormality - biopsy should be  considered.    ASSESSMENT AND PLAN: Breast neoplasm, Tis (LCIS) Given extensive necrosis and calcifications associated with LCIS, will plan needle localization.  Reviewed surgery, risks and benefits with patient.    Discussed bleeding, infection, seroma, damage to adjacent structures, possible diagnosis of cancer, possible need for further procedures or surgery.  Advised not to take aspirin or blood thinners 1 week before surgery.       Rosalene Wardrop L Brunella Wileman  MD Surgical Oncology, General and Endocrine Surgery [facility]      Visit Diagnoses: 1. Breast neoplasm, Tis (LCIS), right     Primary Care Physician: HUSAIN,KARRAR, MD    

## 2013-05-11 NOTE — Anesthesia Procedure Notes (Signed)
Procedure Name: LMA Insertion Performed by: Sherida Dobkins W Pre-anesthesia Checklist: Patient identified, Timeout performed, Emergency Drugs available, Suction available and Patient being monitored Patient Re-evaluated:Patient Re-evaluated prior to inductionOxygen Delivery Method: Circle system utilized Preoxygenation: Pre-oxygenation with 100% oxygen Intubation Type: IV induction Ventilation: Mask ventilation without difficulty LMA: LMA inserted LMA Size: 4.0 Number of attempts: 1 Placement Confirmation: breath sounds checked- equal and bilateral and positive ETCO2 Tube secured with: Tape Dental Injury: Teeth and Oropharynx as per pre-operative assessment      

## 2013-05-12 ENCOUNTER — Encounter (HOSPITAL_BASED_OUTPATIENT_CLINIC_OR_DEPARTMENT_OTHER): Payer: Self-pay | Admitting: General Surgery

## 2013-05-14 ENCOUNTER — Telehealth (INDEPENDENT_AMBULATORY_CARE_PROVIDER_SITE_OTHER): Payer: Self-pay

## 2013-05-14 NOTE — Telephone Encounter (Signed)
LMOV pt to call.  Pathology shows no invasive cancer.

## 2013-05-22 ENCOUNTER — Telehealth (INDEPENDENT_AMBULATORY_CARE_PROVIDER_SITE_OTHER): Payer: Self-pay

## 2013-05-22 NOTE — Telephone Encounter (Signed)
Dr. Biagio Quint called asking for me to call patient due to he had called asking for non emergency assistance . Husband States he is  asking for extended time to be off for his wife  I express the office is closed he need to call Monday 8:30 am. He verbalized understanding

## 2013-05-26 ENCOUNTER — Ambulatory Visit (INDEPENDENT_AMBULATORY_CARE_PROVIDER_SITE_OTHER): Payer: BC Managed Care – PPO | Admitting: General Surgery

## 2013-05-26 ENCOUNTER — Encounter (INDEPENDENT_AMBULATORY_CARE_PROVIDER_SITE_OTHER): Payer: Self-pay | Admitting: General Surgery

## 2013-05-26 ENCOUNTER — Other Ambulatory Visit (INDEPENDENT_AMBULATORY_CARE_PROVIDER_SITE_OTHER): Payer: Self-pay | Admitting: General Surgery

## 2013-05-26 VITALS — BP 144/80 | HR 64 | Resp 16 | Ht 60.0 in | Wt 141.6 lb

## 2013-05-26 DIAGNOSIS — D0502 Lobular carcinoma in situ of left breast: Secondary | ICD-10-CM

## 2013-05-26 DIAGNOSIS — C50919 Malignant neoplasm of unspecified site of unspecified female breast: Secondary | ICD-10-CM

## 2013-05-26 DIAGNOSIS — D0501 Lobular carcinoma in situ of right breast: Secondary | ICD-10-CM

## 2013-05-26 NOTE — Patient Instructions (Signed)
Follow up in 12 months.    Will make referral to Dr. Welton Flakes.  Get 1 year mammogram.

## 2013-05-26 NOTE — Assessment & Plan Note (Signed)
Will make referral to Dr. Welton Flakes given LCIS diagnosis.   Follow up with mammogram and exam in 1 year.

## 2013-05-26 NOTE — Progress Notes (Signed)
HISTORY: Patient is doing well approximately 3 weeks status post right needle localized excisional biopsy. She did have LCIS present on the biopsy with no evidence of invasive cancer. She is still a little sore but is not taking any analgesics or narcotics.  She denies fevers and chills. Her mobility is back to normal.    EXAM: General:  Alert and oriented Incision:  Healing well.  No erythema or drainage.     PATHOLOGY: LCIS   ASSESSMENT AND PLAN:   Breast neoplasm LCIS (right) Will make referral to Dr. Welton Flakes given LCIS diagnosis.   Follow up with mammogram and exam in 1 year.        Maudry Diego, MD Surgical Oncology, General & Endocrine Surgery [facility]  Georgann Housekeeper, MD Georgann Housekeeper, MD

## 2013-06-03 ENCOUNTER — Telehealth (INDEPENDENT_AMBULATORY_CARE_PROVIDER_SITE_OTHER): Payer: Self-pay

## 2013-06-03 NOTE — Telephone Encounter (Signed)
The pt's husband called to check on the appointment with Dr Welton Flakes.  I see the referral in the system.  I told him I will send a message to the nurse and they will get a call back.  Kendall in referrals was notified.

## 2013-06-11 ENCOUNTER — Telehealth: Payer: Self-pay | Admitting: *Deleted

## 2013-06-11 NOTE — Telephone Encounter (Signed)
Pt's husband called and I confirmed 06/26/13 appt w/ him.  Mailed before appt letter & packet to pt.  Called Lauren at referring to make aware.  Took paperwork to Med [facility] to scan.

## 2013-06-25 ENCOUNTER — Other Ambulatory Visit: Payer: Self-pay | Admitting: *Deleted

## 2013-06-25 DIAGNOSIS — D0501 Lobular carcinoma in situ of right breast: Secondary | ICD-10-CM

## 2013-06-25 DIAGNOSIS — C50411 Malignant neoplasm of upper-outer quadrant of right female breast: Secondary | ICD-10-CM

## 2013-06-26 ENCOUNTER — Ambulatory Visit (HOSPITAL_BASED_OUTPATIENT_CLINIC_OR_DEPARTMENT_OTHER): Payer: BC Managed Care – PPO

## 2013-06-26 ENCOUNTER — Encounter: Payer: Self-pay | Admitting: Oncology

## 2013-06-26 ENCOUNTER — Ambulatory Visit (HOSPITAL_BASED_OUTPATIENT_CLINIC_OR_DEPARTMENT_OTHER): Payer: BC Managed Care – PPO | Admitting: Oncology

## 2013-06-26 ENCOUNTER — Other Ambulatory Visit (HOSPITAL_BASED_OUTPATIENT_CLINIC_OR_DEPARTMENT_OTHER): Payer: BC Managed Care – PPO | Admitting: Lab

## 2013-06-26 VITALS — BP 165/90 | HR 75 | Temp 98.3°F | Resp 20 | Ht 60.0 in | Wt 145.6 lb

## 2013-06-26 DIAGNOSIS — D0501 Lobular carcinoma in situ of right breast: Secondary | ICD-10-CM

## 2013-06-26 DIAGNOSIS — C50419 Malignant neoplasm of upper-outer quadrant of unspecified female breast: Secondary | ICD-10-CM

## 2013-06-26 DIAGNOSIS — C50411 Malignant neoplasm of upper-outer quadrant of right female breast: Secondary | ICD-10-CM

## 2013-06-26 DIAGNOSIS — D0590 Unspecified type of carcinoma in situ of unspecified breast: Secondary | ICD-10-CM

## 2013-06-26 LAB — COMPREHENSIVE METABOLIC PANEL (CC13)
ALT: 18 U/L (ref 0–55)
AST: 18 U/L (ref 5–34)
BUN: 11.2 mg/dL (ref 7.0–26.0)
CO2: 27 mEq/L (ref 22–29)
Calcium: 9.2 mg/dL (ref 8.4–10.4)
Chloride: 103 mEq/L (ref 98–109)
Creatinine: 0.8 mg/dL (ref 0.6–1.1)
Total Bilirubin: 0.38 mg/dL (ref 0.20–1.20)

## 2013-06-26 LAB — CBC WITH DIFFERENTIAL/PLATELET
BASO%: 0.3 % (ref 0.0–2.0)
Basophils Absolute: 0 10*3/uL (ref 0.0–0.1)
EOS%: 3.9 % (ref 0.0–7.0)
HCT: 36.7 % (ref 34.8–46.6)
HGB: 12 g/dL (ref 11.6–15.9)
LYMPH%: 29.9 % (ref 14.0–49.7)
MCH: 25.5 pg (ref 25.1–34.0)
MCHC: 32.7 g/dL (ref 31.5–36.0)
NEUT%: 59.6 % (ref 38.4–76.8)
Platelets: 211 10*3/uL (ref 145–400)
lymph#: 2.5 10*3/uL (ref 0.9–3.3)

## 2013-06-26 MED ORDER — EXEMESTANE 25 MG PO TABS: 25.0000 mg | ORAL_TABLET | Freq: Every day | ORAL | Status: DC

## 2013-06-26 NOTE — Progress Notes (Signed)
Checked in new patient with no financial issues. Didn't ask if poa/living will. Mail and phone only for communication.

## 2013-06-26 NOTE — Patient Instructions (Addendum)
We discussed your diagnosis, pathology  We discussed  Treatment options for prevention of future breast cancer risk  We use aromasin 25 mg daily for 5 years  We discussed the side effects  Exemestane tablets What is this medicine? EXEMESTANE (ex e MES tane) blocks the production of the hormone estrogen. Some types of breast cancer depend on estrogen to grow, and this medicine can stop tumor growth by blocking estrogen production. This medicine is for the treatment of breast cancer in postmenopausal women only. This medicine may be used for other purposes; ask your health care provider or pharmacist if you have questions. What should I tell my health care provider before I take this medicine? They need to know if you have any of these conditions: -an unusual or allergic reaction to exemestane, other medicines, foods, dyes, or preservatives -pregnant or trying to get pregnant -breast-feeding How should I use this medicine? Take this medicine by mouth with a glass of water. Follow the directions on the prescription label. Take your doses at regular intervals after a meal. Do not take your medicine more often than directed. Do not stop taking except on the advice of your doctor or health care professional. Contact your pediatrician regarding the use of this medicine in children. Special care may be needed. Overdosage: If you think you have taken too much of this medicine contact a poison control center or emergency room at once. NOTE: This medicine is only for you. Do not share this medicine with others. What if I miss a dose? If you miss a dose, take the next dose as usual. Do not try to make up the missed dose. Do not take double or extra doses. What may interact with this medicine? Do not take this medicine with any of the following medications: -female hormones, like estrogens and birth control pills This medicine may also interact with the following  medications: -androstenedione -phenytoin -rifabutin, rifampin, or rifapentine -St. John's Wort This list may not describe all possible interactions. Give your health care provider a list of all the medicines, herbs, non-prescription drugs, or dietary supplements you use. Also tell them if you smoke, drink alcohol, or use illegal drugs. Some items may interact with your medicine. What should I watch for while using this medicine? Visit your doctor or health care professional for regular checks on your progress. If you experience hot flashes or sweating while taking this medicine, avoid alcohol, smoking and drinks with caffeine. This may help to decrease these side effects. What side effects may I notice from receiving this medicine? Side effects that you should report to your doctor or health care professional as soon as possible: -any new or unusual symptoms -changes in vision -fever -leg or arm swelling -pain in bones, joints, or muscles -pain in hips, back, ribs, arms, shoulders, or legs Side effects that usually do not require medical attention (report to your doctor or health care professional if they continue or are bothersome): -difficulty sleeping -headache -hot flashes -sweating -unusually weak or tired This list may not describe all possible side effects. Call your doctor for medical advice about side effects. You may report side effects to FDA at 1-800-FDA-1088. Where should I keep my medicine? Keep out of the reach of children. Store at room temperature between 15 and 30 degrees C (59 and 86 degrees F). Throw away any unused medicine after the expiration date. NOTE: This sheet is a summary. It may not cover all possible information. If you have questions about this  medicine, talk to your doctor, pharmacist, or health care provider.  2013, Elsevier/Gold Standard. (02/17/2008 11:48:29 AM)

## 2013-07-06 ENCOUNTER — Telehealth: Payer: Self-pay | Admitting: Oncology

## 2013-07-12 NOTE — Progress Notes (Signed)
Ashley Pratt [id number] 30-Mar-1951 61 y.o. 07/12/2013 12:50 AM  CC  Ashley Housekeeper, MD [address] [place] [address] Dr. Everardo Beals REASON FOR CONSULTATION:  62 year old female with LCIS of the right breast. Being seen in medical oncology for discussion of future breast cancer risk reduction   STAGE:   LCIS  REFERRING PHYSICIAN: Dr. Everardo Beals  HISTORY OF PRESENT ILLNESS:  Ashley Pratt is a 62 y.o. female.  Medical history significant for hypertension and reflux.who presented with an abnormal mammogram in right breast. Core needle biopsy was performed that demonstrated LCIS with extensive necrosis and calcifications. Because of this she  Underwent right needle localized excisional biopsy that showed LCIS without any evidence of invasive cancer. Postoperatively she has done well without any problems. She was referred to me by Dr. Donell Beers for further adjuvant treatment for future breast cancer risk reduction.   Past Medical History: Past Medical History  Diagnosis Date  . Hypertension   . Heart murmur   . GERD (gastroesophageal reflux disease)   . Arthritis   . Chronic back pain     Past Surgical History: Past Surgical History  Procedure Laterality Date  . Cholecystectomy    . Foot osteotomy      both  feet  . Tonsillectomy    . Breast lumpectomy with needle localization Right 05/11/2013    Procedure: RIGHT BREAST NEEDLE LOCALIZATION  LUMPECTOMY;  Surgeon: Almond Lint, MD;  Location: [facility];  Service: General;  Laterality: Right;    Family History: No family history on file.  Social History History  Substance Use Topics  . Smoking status: Never Smoker   . Smokeless tobacco: Never Used  . Alcohol Use: No    Allergies: Allergies  Allergen Reactions  . Aspirin     Gi upset  . Penicillins     Not sure    Current Medications: Current Outpatient Prescriptions  Medication Sig Dispense Refill  . losartan  (COZAAR) 25 MG tablet Take 25 mg by mouth daily.      . ranitidine (ZANTAC) 150 MG capsule Take 150 mg by mouth 2 (two) times daily.      Marland Kitchen acetaminophen (TYLENOL) 325 MG tablet Take 650 mg by mouth every 6 (six) hours as needed for pain.      Marland Kitchen exemestane (AROMASIN) 25 MG tablet Take 1 tablet (25 mg total) by mouth daily after breakfast.  30 tablet  12   No current facility-administered medications for this visit.    OB/GYN History:patient had menarche at 83 or 47 first live birth was at 55 she underwent menopause around 28  Fertility Discussion: not applicable Prior History of Cancer: no  Health Maintenance:  Colonoscopy unknown Bone Density unknown Last PAP smear unknown  ECOG PERFORMANCE STATUS: 0 - Asymptomatic  Genetic Counseling/testing: no  REVIEW OF SYSTEMS:  A comprehensive review of systems was negative.  PHYSICAL EXAMINATION: Blood pressure 165/90, pulse 75, temperature 98.3 F (36.8 C), temperature source Oral, resp. rate 20, height 5' (1.524 m), weight 145 lb 9.6 oz (66.044 kg).  NFA:OZHYQ, healthy, no distress, well nourished and well developed SKIN: skin color, texture, turgor are normal HEAD: Normocephalic EYES: PERRLA, EOMI, Conjunctiva are pink and non-injected EARS: External ears normal OROPHARYNX:no exudate, no erythema and lips, buccal mucosa, and tongue normal  NECK: supple, no adenopathy LYMPH:  no palpable lymphadenopathy BREAST:right breast normal without mass, skin or nipple changes or axillary nodes, left breast normal without mass, skin or nipple  changes or axillary nodes LUNGS: clear to auscultation and percussion HEART: regular rate & rhythm ABDOMEN:abdomen soft, non-tender, normal bowel sounds and no masses or organomegaly BACK: Back symmetric, no curvature., No CVA tenderness EXTREMITIES:no edema, no clubbing, no cyanosis  NEURO: alert & oriented x 3 with fluent speech, no focal motor/sensory deficits, gait  normal     STUDIES/RESULTS: No results found.   LABS:    Chemistry      Component Value Date/Time   NA 138 06/26/2013 1456   NA 135 05/07/2013 1240   K 3.9 06/26/2013 1456   K 5.1 05/07/2013 1240   CL 98 05/07/2013 1240   CO2 27 06/26/2013 1456   CO2 28 05/07/2013 1240   BUN 11.2 06/26/2013 1456   BUN 13 05/07/2013 1240   CREATININE 0.8 06/26/2013 1456   CREATININE 0.86 05/07/2013 1240      Component Value Date/Time   CALCIUM 9.2 06/26/2013 1456   CALCIUM 9.6 05/07/2013 1240   ALKPHOS 60 06/26/2013 1456   AST 18 06/26/2013 1456   ALT 18 06/26/2013 1456   BILITOT 0.38 06/26/2013 1456      Lab Results  Component Value Date   WBC 8.5 06/26/2013   HGB 12.0 06/26/2013   HCT 36.7 06/26/2013   MCV 78.0* 06/26/2013   PLT 211 06/26/2013   PATHOLOGY: Diagnosis Breast, lumpectomy, Right - LOBULAR CARCINOMA IN SITU WITH CALCIFICATIONS, PARTIALLY INVOLVING AN INTRADUCTAL PAPILLOMA. - FIBROCYSTIC CHANGES WITH CALCIFICATIONS. - HEALING BIOPSY SITE. - SEE COMMENT. Microscopic Comment Immunohistochemical stains for smooth muscle myosin, calponin, p63 and cytokeratin AE1/AE3 fail to highlight the presence of invasive carcinoma. A cytokeratin 5/6 stain is negative for the presence of atypical ductal hyperplasia. The surgical resection margin(s) of the specimen were inked and microscopically evaluated. (JBK:caf 05/14/13)  ASSESSMENT    62 year old female with  #1 new diagnosis of LCIS on a biopsy done subsequently patient is status post right needle localized excisional biopsy that only revealed LCIS no evidence of invasive cancer. Postoperatively she is doing well.  #2 patient and I discussed lobular carcinoma in situ and the significance of it as well as for future breast cancer risk. We discussed prevention strategies including use of tamoxifen 20 mg daily. We discussed risks benefits and side effects of it. She understands that she will be taking this for 5 years.  #3 we discussed the need  for ongoing annual mammograms as well as physical examinations and clinical examinations. We discussed exercise eating healthy.  Clinical Trial Eligibility: no Multidisciplinary conference discussion no     PLAN:    #1 proceed with tamoxifen 20 mg daily for a total of 5 years.  #2 she'll have an annual mammogram in one years time as well as an examination.  #3 I will see her back in 3 months time for followup.        Discussion: Patient is being treated per NCCN breast cancer care guidelines appropriate for stage.n/a   Thank you so much for allowing me to participate in the care of Ashley Pratt. I will continue to follow up the patient with you and assist in her care.  All questions were answered. The patient knows to call the clinic with any problems, questions or concerns. We can certainly see the patient much sooner if necessary.  I spent 40 minutes counseling the patient face to face. The total time spent in the appointment was 45 minutes.  Drue Second, MD Medical/Oncology [facility] [phone] (beeper) [phone] (Office)  07/12/2013, 12:50 AM

## 2013-07-28 ENCOUNTER — Ambulatory Visit
Admission: RE | Admit: 2013-07-28 | Discharge: 2013-07-28 | Disposition: A | Discharge status: Home / Self Care | Payer: BC Managed Care – PPO | Source: Ambulatory Visit | Attending: Oncology | Admitting: Oncology

## 2013-07-28 DIAGNOSIS — D0501 Lobular carcinoma in situ of right breast: Secondary | ICD-10-CM

## 2013-07-29 ENCOUNTER — Telehealth: Payer: Self-pay | Admitting: *Deleted

## 2013-07-29 NOTE — Telephone Encounter (Signed)
Called patient with instructions to stop the aromasin.  Spouse answered phone and took the information.  Asked for a return call in a week with an update on the pain.

## 2013-07-29 NOTE — Telephone Encounter (Signed)
Please let her know that it is likely the surgery causing this but she can hold the pill for a week and see what happens

## 2013-07-29 NOTE — Telephone Encounter (Signed)
Spouse and patient called requesting a call from Dr. Welton Flakes.  Are awaiting return call due to "pain in right breast where surgery was performed".  Asked that she also notify the surgeon.  "No pain in this area until aromasin was started a month ago so we think something is wrong with the prescription."  Unable to pinpoint a specific description of the pain sensation.  Says she does take one pill daily for pain.  No name of pill given only asked to speak directly with provider.  Will notify MD.

## 2013-08-21 ENCOUNTER — Encounter: Payer: Self-pay | Admitting: *Deleted

## 2013-08-21 NOTE — Progress Notes (Signed)
Mailed after appt letter to pt. 

## 2013-09-08 ENCOUNTER — Telehealth (INDEPENDENT_AMBULATORY_CARE_PROVIDER_SITE_OTHER): Payer: Self-pay

## 2013-09-08 NOTE — Telephone Encounter (Signed)
Pts husband called requesting appt with Dr Donell Beers due to pain at surgical site. No redness. No swelling. No fever. I could not find open appt time for 6 weeks. Pt advised I will send request for appt to Dr Arita Miss assistant and have her call him back at [phone].

## 2013-09-08 NOTE — Telephone Encounter (Signed)
Spoke with pt's husband.  He states that his wife started having breast pain after taking a medication prescribed by Dr. Welton Flakes.  I suggested he call her office.  If Dr. Park Breed feels we need to see the patient, he will call back to schedule an appointment with Dr. Donell Beers.

## 2013-09-15 ENCOUNTER — Telehealth: Payer: Self-pay | Admitting: *Deleted

## 2013-09-15 NOTE — Telephone Encounter (Signed)
Per Corrie Dandy.per orders pof pt is aware of her appt...td

## 2013-09-15 NOTE — Telephone Encounter (Signed)
Pt having right breast pain "like a needle in in her" per pt's husband. No fever, no shortness of breath, no swelling.  Pain mostly at night, pt stopped tamoxifen about 3weeks after given rx due to pain.  Reviewed with MD, pt to be seen at 2:30 wed 11/19. Notified Mr. Teale who advised he will tell pt about appt and he will bring her in to see MD. No further concerns.

## 2013-09-16 ENCOUNTER — Telehealth: Payer: Self-pay | Admitting: Oncology

## 2013-09-16 ENCOUNTER — Encounter: Payer: Self-pay | Admitting: Oncology

## 2013-09-16 ENCOUNTER — Ambulatory Visit (HOSPITAL_BASED_OUTPATIENT_CLINIC_OR_DEPARTMENT_OTHER): Payer: BC Managed Care – PPO | Admitting: Oncology

## 2013-09-16 DIAGNOSIS — D0501 Lobular carcinoma in situ of right breast: Secondary | ICD-10-CM

## 2013-09-16 DIAGNOSIS — D0590 Unspecified type of carcinoma in situ of unspecified breast: Secondary | ICD-10-CM

## 2013-09-16 MED ORDER — EXEMESTANE 25 MG PO TABS: 25.0000 mg | ORAL_TABLET | Freq: Every day | ORAL | Status: DC

## 2013-09-16 NOTE — Telephone Encounter (Signed)
, °

## 2013-09-16 NOTE — Progress Notes (Signed)
Ashley Pratt [id number] 12-29-1950 61 y.o. 09/16/2013 2:55 PM  CC  Ashley Housekeeper, MD [address], [address] [place] [address] Dr. 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 Being seen in medical oncology for discussion of future breast cancer risk reduction   STAGE:   LCIS  REFERRING PHYSICIAN: Dr. Everardo Beals  Prior oncologic history:  Ashley Pratt is a 62 y.o. female.  Medical history significant for hypertension and reflux.who presented with an abnormal mammogram in right breast. Core needle biopsy was performed that demonstrated LCIS with extensive necrosis and calcifications. Because of this she  Underwent right needle localized excisional biopsy that showed LCIS without any evidence of invasive cancer. Patient was begun on Aromasin 25 mg daily.  Current therapy Aromasin 25 mg daily.  Interval history: Patient is seen in followup. Patient wanted to be seen earlier than her scheduled appointment because she was having some pain in her right breast at the surgical site. She was very concerned that her cancer might be coming back. I have examined her and I do not feel any lumps or bumps. She has no swelling in this area. Patient discontinued taking the Aromasin but she will not restart it.  Past Medical History: Past Medical History  Diagnosis Date  . Hypertension   . Heart murmur   . GERD (gastroesophageal reflux disease)   . Arthritis   . Chronic back pain     Past Surgical History: Past Surgical History  Procedure Laterality Date  . Cholecystectomy    . Foot osteotomy      both  feet  . Tonsillectomy    . Breast lumpectomy with needle localization Right 05/11/2013    Procedure: RIGHT BREAST NEEDLE LOCALIZATION  LUMPECTOMY;  Surgeon: Almond Lint, MD;  Location: [facility];  Service: General;  Laterality: Right;    Family History: History reviewed. No pertinent family history.  Social  History History  Substance Use Topics  . Smoking status: Never Smoker   . Smokeless tobacco: Never Used  . Alcohol Use: No    Allergies: Allergies  Allergen Reactions  . Aspirin     Gi upset  . Penicillins     Not sure    Current Medications: Current Outpatient Prescriptions  Medication Sig Dispense Refill  . losartan (COZAAR) 25 MG tablet Take 25 mg by mouth daily.      . ranitidine (ZANTAC) 150 MG capsule Take 150 mg by mouth 2 (two) times daily.       No current facility-administered medications for this visit.    OB/GYN History:patient had menarche at 63 or 68 first live birth was at 69 she underwent menopause around 24  Fertility Discussion: not applicable Prior History of Cancer: no  Health Maintenance:  Colonoscopy unknown Bone Density unknown Last PAP smear unknown  ECOG PERFORMANCE STATUS: 0 - Asymptomatic  Genetic Counseling/testing: no  REVIEW OF SYSTEMS:  A comprehensive review of systems was negative.  PHYSICAL EXAMINATION: There were no vitals taken for this visit.  NFA:OZHYQ, healthy, no distress, well nourished and well developed SKIN: skin color, texture, turgor are normal HEAD: Normocephalic EYES: PERRLA, EOMI, Conjunctiva are pink and non-injected EARS: External ears normal OROPHARYNX:no exudate, no erythema and lips, buccal mucosa, and tongue normal  NECK: supple, no adenopathy LYMPH:  no palpable lymphadenopathy BREAST:right breast normal without mass, skin or nipple changes or axillary nodes, left breast normal without mass, skin or nipple changes or axillary nodes LUNGS:  clear to auscultation and percussion HEART: regular rate & rhythm ABDOMEN:abdomen soft, non-tender, normal bowel sounds and no masses or organomegaly BACK: Back symmetric, no curvature., No CVA tenderness EXTREMITIES:no edema, no clubbing, no cyanosis  NEURO: alert & oriented x 3 with fluent speech, no focal motor/sensory deficits, gait  normal     STUDIES/RESULTS: No results found.   LABS:    Chemistry      Component Value Date/Time   NA 138 06/26/2013 1456   NA 135 05/07/2013 1240   K 3.9 06/26/2013 1456   K 5.1 05/07/2013 1240   CL 98 05/07/2013 1240   CO2 27 06/26/2013 1456   CO2 28 05/07/2013 1240   BUN 11.2 06/26/2013 1456   BUN 13 05/07/2013 1240   CREATININE 0.8 06/26/2013 1456   CREATININE 0.86 05/07/2013 1240      Component Value Date/Time   CALCIUM 9.2 06/26/2013 1456   CALCIUM 9.6 05/07/2013 1240   ALKPHOS 60 06/26/2013 1456   AST 18 06/26/2013 1456   ALT 18 06/26/2013 1456   BILITOT 0.38 06/26/2013 1456      Lab Results  Component Value Date   WBC 8.5 06/26/2013   HGB 12.0 06/26/2013   HCT 36.7 06/26/2013   MCV 78.0* 06/26/2013   PLT 211 06/26/2013   PATHOLOGY: Diagnosis Breast, lumpectomy, Right - LOBULAR CARCINOMA IN SITU WITH CALCIFICATIONS, PARTIALLY INVOLVING AN INTRADUCTAL PAPILLOMA. - FIBROCYSTIC CHANGES WITH CALCIFICATIONS. - HEALING BIOPSY SITE. - SEE COMMENT. Microscopic Comment Immunohistochemical stains for smooth muscle myosin, calponin, p63 and cytokeratin AE1/AE3 fail to highlight the presence of invasive carcinoma. A cytokeratin 5/6 stain is negative for the presence of atypical ductal hyperplasia. The surgical resection margin(s) of the specimen were inked and microscopically evaluated. (JBK:caf 05/14/13)  ASSESSMENT    62 year old female with  #1 new diagnosis of LCIS on a biopsy done subsequently patient is status post right needle localized excisional biopsy that only revealed LCIS no evidence of invasive cancer. Patient was begun on Aromasin 25 mg daily. She's tolerating it well.  #2 patient's been having some pains in her breasts. She is reassured that this is from the surgical site itself and she does not have a recurrence of her LCIS or another cancer. She is encouraged to continue taking the Aromasin on a regular basis.   #3 we discussed the need for ongoing annual  mammograms as well as physical examinations and clinical examinations. We discussed exercise eating healthy.  PLAN: #1 patient will continue Aromasin 25 mg Prescription was sent to her pharmacy.  #2 patient will be seen back in 6 months time or sooner if need arises  Thank you so much for allowing me to participate in the care of Ashley Pratt. I will continue to follow up the patient with you and assist in her care.  All questions were answered. The patient knows to call the clinic with any problems, questions or concerns. We can certainly see the patient much sooner if necessary.  I spent 15 minutes counseling the patient face to face. The total time spent in the appointment was 25 minutes.  Drue Second, MD Medical/Oncology [facility] [phone] (beeper) [phone] (Office)  09/16/2013, 2:55 PM

## 2013-09-30 ENCOUNTER — Ambulatory Visit: Payer: BC Managed Care – PPO | Admitting: Oncology

## 2013-09-30 ENCOUNTER — Other Ambulatory Visit: Payer: BC Managed Care – PPO | Admitting: Lab

## 2013-10-15 ENCOUNTER — Ambulatory Visit: Payer: BC Managed Care – PPO | Admitting: Oncology

## 2013-10-15 ENCOUNTER — Other Ambulatory Visit: Payer: BC Managed Care – PPO | Admitting: Lab

## 2013-12-18 ENCOUNTER — Ambulatory Visit (HOSPITAL_BASED_OUTPATIENT_CLINIC_OR_DEPARTMENT_OTHER): Payer: BC Managed Care – PPO | Admitting: Oncology

## 2013-12-18 ENCOUNTER — Encounter (INDEPENDENT_AMBULATORY_CARE_PROVIDER_SITE_OTHER): Payer: Self-pay

## 2013-12-18 ENCOUNTER — Encounter: Payer: Self-pay | Admitting: Oncology

## 2013-12-18 ENCOUNTER — Other Ambulatory Visit (HOSPITAL_BASED_OUTPATIENT_CLINIC_OR_DEPARTMENT_OTHER): Payer: BC Managed Care – PPO

## 2013-12-18 ENCOUNTER — Telehealth: Payer: Self-pay | Admitting: Oncology

## 2013-12-18 VITALS — BP 182/89 | HR 72 | Temp 98.2°F | Resp 20 | Ht 60.0 in | Wt 147.4 lb

## 2013-12-18 DIAGNOSIS — D0590 Unspecified type of carcinoma in situ of unspecified breast: Secondary | ICD-10-CM

## 2013-12-18 DIAGNOSIS — D0500 Lobular carcinoma in situ of unspecified breast: Secondary | ICD-10-CM

## 2013-12-18 DIAGNOSIS — D0501 Lobular carcinoma in situ of right breast: Secondary | ICD-10-CM

## 2013-12-18 DIAGNOSIS — Z170 Estrogen receptor positive status [ER+]: Secondary | ICD-10-CM

## 2013-12-18 LAB — CBC WITH DIFFERENTIAL/PLATELET
BASO%: 0.4 % (ref 0.0–2.0)
Basophils Absolute: 0 10*3/uL (ref 0.0–0.1)
EOS%: 4.6 % (ref 0.0–7.0)
Eosinophils Absolute: 0.3 10*3/uL (ref 0.0–0.5)
HCT: 39.6 % (ref 34.8–46.6)
HGB: 12.6 g/dL (ref 11.6–15.9)
LYMPH%: 44.6 % (ref 14.0–49.7)
MCH: 25.2 pg (ref 25.1–34.0)
MCHC: 31.8 g/dL (ref 31.5–36.0)
MCV: 79.3 fL — AB (ref 79.5–101.0)
MONO#: 0.4 10*3/uL (ref 0.1–0.9)
MONO%: 6 % (ref 0.0–14.0)
NEUT%: 44.4 % (ref 38.4–76.8)
NEUTROS ABS: 3.3 10*3/uL (ref 1.5–6.5)
Platelets: 209 10*3/uL (ref 145–400)
RBC: 5 10*6/uL (ref 3.70–5.45)
RDW: 14 % (ref 11.2–14.5)
WBC: 7.4 10*3/uL (ref 3.9–10.3)
lymph#: 3.3 10*3/uL (ref 0.9–3.3)

## 2013-12-18 LAB — COMPREHENSIVE METABOLIC PANEL (CC13)
ALBUMIN: 4.3 g/dL (ref 3.5–5.0)
ALK PHOS: 65 U/L (ref 40–150)
ALT: 22 U/L (ref 0–55)
AST: 21 U/L (ref 5–34)
Anion Gap: 11 mEq/L (ref 3–11)
BUN: 18 mg/dL (ref 7.0–26.0)
CO2: 26 mEq/L (ref 22–29)
Calcium: 9.9 mg/dL (ref 8.4–10.4)
Chloride: 105 mEq/L (ref 98–109)
Creatinine: 0.8 mg/dL (ref 0.6–1.1)
Glucose: 110 mg/dl (ref 70–140)
POTASSIUM: 3.7 meq/L (ref 3.5–5.1)
SODIUM: 142 meq/L (ref 136–145)
TOTAL PROTEIN: 7.3 g/dL (ref 6.4–8.3)
Total Bilirubin: 0.38 mg/dL (ref 0.20–1.20)

## 2013-12-18 NOTE — Telephone Encounter (Signed)
m, °

## 2013-12-18 NOTE — Progress Notes (Signed)
Ashley Pratt [id number] [birth year] 64 y.o. 12/18/2013 3:43 PM  CC  Wenda Low, MD [address], [address] [place] [address] Dr. Stark Klein  DIAGNOSIS:  63 year old female with LCIS of the right breast. Being seen in medical oncology for discussion of future breast cancer risk reduction   STAGE:   LCIS  REFERRING PHYSICIAN: Dr. Theda Sers  PRIOR ONCOLOGIC HISTORY:  Ashley Pratt is a 63 y.o. female.  Medical history significant for hypertension and reflux.who presented with an abnormal mammogram in right breast. Core needle biopsy was performed that demonstrated LCIS with extensive necrosis and calcifications. Because of this she  Underwent right needle localized excisional biopsy that showed LCIS without any evidence of invasive cancer. Patient was begun on Aromasin 25 mg daily.  Current therapy Aromasin 25 mg daily.  Interval history: Patient is seen in followup.nt because she was having some pain in her right breast at the surgical site. She was very concerned that her cancer might be coming back. However she is not taking the aromasin as prescribed. We discussed the rational for taking the recommended dose as well as the schedule. Pain in the breast continues. This is due to the surgery. No evidence of local recurrence, no evidence of erythema. Past Medical History: Past Medical History  Diagnosis Date  . Hypertension   . Heart murmur   . GERD (gastroesophageal reflux disease)   . Arthritis   . Chronic back pain     Past Surgical History: Past Surgical History  Procedure Laterality Date  . Cholecystectomy    . Foot osteotomy      both  feet  . Tonsillectomy    . Breast lumpectomy with needle localization Right 05/11/2013    Procedure: RIGHT BREAST NEEDLE LOCALIZATION  LUMPECTOMY;  Surgeon: Stark Klein, MD;  Location: [facility];  Service: General;  Laterality: Right;    Family History: History reviewed. No pertinent family  history.  Social History History  Substance Use Topics  . Smoking status: Never Smoker   . Smokeless tobacco: Never Used  . Alcohol Use: No    Allergies: Allergies  Allergen Reactions  . Aspirin     Gi upset  . Penicillins     Not sure    Current Medications: Current Outpatient Prescriptions  Medication Sig Dispense Refill  . exemestane (AROMASIN) 25 MG tablet Take 1 tablet (25 mg total) by mouth daily after breakfast.  90 tablet  6  . losartan (COZAAR) 25 MG tablet Take 25 mg by mouth daily.      . ranitidine (ZANTAC) 150 MG capsule Take 150 mg by mouth daily.        No current facility-administered medications for this visit.    OB/GYN History:patient had menarche at 71 or 21 first live birth was at 8 she underwent menopause around 63  Fertility Discussion: not applicable Prior History of Cancer: no  Health Maintenance:  Colonoscopy unknown Bone Density unknown Last PAP smear unknown  ECOG PERFORMANCE STATUS: 0 - Asymptomatic  Genetic Counseling/testing: no  REVIEW OF SYSTEMS:  A comprehensive review of systems was negative.  PHYSICAL EXAMINATION: Blood pressure 182/89, pulse 72, temperature 98.2 F (36.8 C), temperature source Oral, resp. rate 20, height 5' (1.524 m), weight 147 lb 6.4 oz (66.86 kg).  NGE:XBMWU, healthy, no distress, well nourished and well developed SKIN: skin color, texture, turgor are normal HEAD: Normocephalic EYES: PERRLA, EOMI, Conjunctiva are pink and non-injected EARS: External ears normal OROPHARYNX:no exudate, no erythema and  lips, buccal mucosa, and tongue normal  NECK: supple, no adenopathy LYMPH:  no palpable lymphadenopathy BREAST:right breast normal without mass, skin or nipple changes or axillary nodes, left breast normal without mass, skin or nipple changes or axillary nodes LUNGS: clear to auscultation and percussion HEART: regular rate & rhythm ABDOMEN:abdomen soft, non-tender, normal bowel sounds and no masses or  organomegaly BACK: Back symmetric, no curvature., No CVA tenderness EXTREMITIES:no edema, no clubbing, no cyanosis  NEURO: alert & oriented x 3 with fluent speech, no focal motor/sensory deficits, gait normal     STUDIES/RESULTS: No results found.   LABS:    Chemistry      Component Value Date/Time   NA 138 06/26/2013 1456   NA 135 05/07/2013 1240   K 3.9 06/26/2013 1456   K 5.1 05/07/2013 1240   CL 98 05/07/2013 1240   CO2 27 06/26/2013 1456   CO2 28 05/07/2013 1240   BUN 11.2 06/26/2013 1456   BUN 13 05/07/2013 1240   CREATININE 0.8 06/26/2013 1456   CREATININE 0.86 05/07/2013 1240      Component Value Date/Time   CALCIUM 9.2 06/26/2013 1456   CALCIUM 9.6 05/07/2013 1240   ALKPHOS 60 06/26/2013 1456   AST 18 06/26/2013 1456   ALT 18 06/26/2013 1456   BILITOT 0.38 06/26/2013 1456      Lab Results  Component Value Date   WBC 7.4 12/18/2013   HGB 12.6 12/18/2013   HCT 39.6 12/18/2013   MCV 79.3* 12/18/2013   PLT 209 12/18/2013   PATHOLOGY: Diagnosis Breast, lumpectomy, Right - LOBULAR CARCINOMA IN SITU WITH CALCIFICATIONS, PARTIALLY INVOLVING AN INTRADUCTAL PAPILLOMA. - FIBROCYSTIC CHANGES WITH CALCIFICATIONS. - HEALING BIOPSY SITE. - SEE COMMENT. Microscopic Comment Immunohistochemical stains for smooth muscle myosin, calponin, p63 and cytokeratin AE1/AE3 fail to highlight the presence of invasive carcinoma. A cytokeratin 5/6 stain is negative for the presence of atypical ductal hyperplasia. The surgical resection margin(s) of the specimen were inked and microscopically evaluated. (JBK:caf 05/14/13)  ASSESSMENT/PLAN:    63 year old female with  #1  LCIS on a biopsy done subsequently patient is status post right needle localized excisional biopsy that only revealed LCIS no evidence of invasive cancer.  #2 patient was begun on aromasin 25 mg daily, however she is only taking it three times a week. We discussed that in order for the drug to be effective we need to stay on the  schedule as recommended. She however is very concerned about the side effects she experiences. She gets very hot and has a lot sweating. After an extensive she does want to take the medication but only 1/2 a pill daily for 1 month and then she will take it whole starting in March.  3. Breast pain: this is at the surgical site. I assured the patient that this likely due to her surgery and that over time it should improve.  #4 we discussed the need for ongoing annual mammograms as well as physical examinations and clinical examinations. We discussed exercise eating healthy.  Thank you so much for allowing me to participate in the care of Ashley Pratt. I will continue to follow up the patient with you and assist in her care.  All questions were answered. The patient knows to call the clinic with any problems, questions or concerns. We can certainly see the patient much sooner if necessary.  I spent 15 minutes counseling the patient face to face. The total time spent in the appointment was 25 minutes.  Ashyah Quizon  Humphrey Rolls, MD Medical/Oncology [facility] [phone] (beeper) [phone] (Office)  12/18/2013, 3:43 PM

## 2014-02-16 ENCOUNTER — Other Ambulatory Visit: Payer: Self-pay | Admitting: Internal Medicine

## 2014-02-16 DIAGNOSIS — Z853 Personal history of malignant neoplasm of breast: Secondary | ICD-10-CM

## 2014-02-25 ENCOUNTER — Ambulatory Visit
Admission: RE | Admit: 2014-02-25 | Discharge: 2014-02-25 | Disposition: A | Discharge status: Home / Self Care | Payer: BC Managed Care – PPO | Source: Ambulatory Visit | Attending: Internal Medicine | Admitting: Internal Medicine

## 2014-02-25 DIAGNOSIS — Z853 Personal history of malignant neoplasm of breast: Secondary | ICD-10-CM

## 2014-02-26 ENCOUNTER — Ambulatory Visit (HOSPITAL_COMMUNITY): Payer: BC Managed Care – PPO | Attending: Internal Medicine | Admitting: Radiology

## 2014-02-26 ENCOUNTER — Other Ambulatory Visit (HOSPITAL_COMMUNITY): Payer: Self-pay | Admitting: Internal Medicine

## 2014-02-26 DIAGNOSIS — I519 Heart disease, unspecified: Secondary | ICD-10-CM | POA: Insufficient documentation

## 2014-02-26 DIAGNOSIS — I059 Rheumatic mitral valve disease, unspecified: Secondary | ICD-10-CM | POA: Insufficient documentation

## 2014-02-26 DIAGNOSIS — R011 Cardiac murmur, unspecified: Secondary | ICD-10-CM

## 2014-02-26 DIAGNOSIS — R0602 Shortness of breath: Secondary | ICD-10-CM

## 2014-02-26 NOTE — Progress Notes (Signed)
Echocardiogram performed.  

## 2014-05-31 ENCOUNTER — Ambulatory Visit (INDEPENDENT_AMBULATORY_CARE_PROVIDER_SITE_OTHER): Payer: BC Managed Care – PPO | Admitting: General Surgery

## 2014-06-12 ENCOUNTER — Telehealth: Payer: Self-pay | Admitting: Oncology

## 2014-06-12 NOTE — Telephone Encounter (Signed)
, °

## 2014-07-02 ENCOUNTER — Other Ambulatory Visit: Payer: BC Managed Care – PPO

## 2014-07-02 ENCOUNTER — Ambulatory Visit: Payer: BC Managed Care – PPO | Admitting: Oncology

## 2014-07-06 ENCOUNTER — Other Ambulatory Visit: Payer: Self-pay | Admitting: Oncology

## 2014-07-08 ENCOUNTER — Other Ambulatory Visit: Payer: Self-pay | Admitting: Oncology

## 2014-07-08 ENCOUNTER — Telehealth: Payer: Self-pay

## 2014-07-08 NOTE — Telephone Encounter (Signed)
Received vitamin D report from Dr. Glenna Durand office.  Level was 25.6 on 02-16-13.   Results given to Dr. Marko Plume for review.

## 2014-07-08 NOTE — Telephone Encounter (Signed)
Message copied by Baruch Merl on Thu Jul 08, 2014 10:50 AM ------      Message from: Gordy Levan      Created: Tue Jul 06, 2014  8:36 PM      Regarding: lab for visit 9-14       To see Lennis on 9-14.            Please find out if PCP Dr Wenda Low has checked vit D level - if so I would like result, if not please add to lab on 9-14            thanks ------

## 2014-07-09 ENCOUNTER — Other Ambulatory Visit: Payer: Self-pay

## 2014-07-09 DIAGNOSIS — D0501 Lobular carcinoma in situ of right breast: Secondary | ICD-10-CM

## 2014-07-12 ENCOUNTER — Ambulatory Visit (HOSPITAL_BASED_OUTPATIENT_CLINIC_OR_DEPARTMENT_OTHER): Payer: BC Managed Care – PPO | Admitting: Oncology

## 2014-07-12 ENCOUNTER — Encounter: Payer: Self-pay | Admitting: Oncology

## 2014-07-12 ENCOUNTER — Other Ambulatory Visit (HOSPITAL_BASED_OUTPATIENT_CLINIC_OR_DEPARTMENT_OTHER): Payer: BC Managed Care – PPO

## 2014-07-12 VITALS — BP 167/78 | HR 62 | Temp 98.3°F | Resp 18 | Ht 60.0 in | Wt 144.0 lb

## 2014-07-12 DIAGNOSIS — Z170 Estrogen receptor positive status [ER+]: Secondary | ICD-10-CM

## 2014-07-12 DIAGNOSIS — D0590 Unspecified type of carcinoma in situ of unspecified breast: Secondary | ICD-10-CM

## 2014-07-12 DIAGNOSIS — D0501 Lobular carcinoma in situ of right breast: Secondary | ICD-10-CM

## 2014-07-12 DIAGNOSIS — M949 Disorder of cartilage, unspecified: Secondary | ICD-10-CM

## 2014-07-12 DIAGNOSIS — M899 Disorder of bone, unspecified: Secondary | ICD-10-CM

## 2014-07-12 LAB — COMPREHENSIVE METABOLIC PANEL (CC13)
ALBUMIN: 4 g/dL (ref 3.5–5.0)
ALK PHOS: 63 U/L (ref 40–150)
ALT: 23 U/L (ref 0–55)
AST: 22 U/L (ref 5–34)
Anion Gap: 9 mEq/L (ref 3–11)
BUN: 13.9 mg/dL (ref 7.0–26.0)
CO2: 28 meq/L (ref 22–29)
Calcium: 9.8 mg/dL (ref 8.4–10.4)
Chloride: 104 mEq/L (ref 98–109)
Creatinine: 1 mg/dL (ref 0.6–1.1)
Glucose: 99 mg/dl (ref 70–140)
POTASSIUM: 4.2 meq/L (ref 3.5–5.1)
SODIUM: 141 meq/L (ref 136–145)
TOTAL PROTEIN: 7.1 g/dL (ref 6.4–8.3)
Total Bilirubin: 0.59 mg/dL (ref 0.20–1.20)

## 2014-07-12 LAB — CBC WITH DIFFERENTIAL/PLATELET
BASO%: 0.4 % (ref 0.0–2.0)
Basophils Absolute: 0 10*3/uL (ref 0.0–0.1)
EOS%: 3.4 % (ref 0.0–7.0)
Eosinophils Absolute: 0.3 10*3/uL (ref 0.0–0.5)
HCT: 40 % (ref 34.8–46.6)
HGB: 12.8 g/dL (ref 11.6–15.9)
LYMPH#: 3 10*3/uL (ref 0.9–3.3)
LYMPH%: 33.1 % (ref 14.0–49.7)
MCH: 25.2 pg (ref 25.1–34.0)
MCHC: 31.9 g/dL (ref 31.5–36.0)
MCV: 79 fL — ABNORMAL LOW (ref 79.5–101.0)
MONO#: 0.6 10*3/uL (ref 0.1–0.9)
MONO%: 7.1 % (ref 0.0–14.0)
NEUT%: 56 % (ref 38.4–76.8)
NEUTROS ABS: 5 10*3/uL (ref 1.5–6.5)
Platelets: 227 10*3/uL (ref 145–400)
RBC: 5.07 10*6/uL (ref 3.70–5.45)
RDW: 14.6 % — AB (ref 11.2–14.5)
WBC: 9 10*3/uL (ref 3.9–10.3)

## 2014-07-12 NOTE — Progress Notes (Signed)
OFFICE PROGRESS NOTE   07/12/2014   Physicians:(Kalsoom Humphrey Rolls), K.Husain, F.Byerly  INTERVAL HISTORY:  Patient is seen, alone for visit and for the first time by this MD, in follow up of ongoing preventative treatment with aromatase inhibitor for LCIS right breast, this begun by Dr Humphrey Rolls [phone]. Information is from EMR and history reviewed with patient at visit. Most recent bilateral diagnostic mammograms were at [facility] 02-25-2014, with heterogeneously dense breast tissue but no mammographic findings of concern.Last DEXA scan was at [facility] 07-28-2013, with osteopenia (T score LS -1.7 and femur -1.6); she is on calcium and D, with last Vitamin D level by Dr Lysle Rubens 02-16-13 of 25.6. She has not had hysterectomy or oophorectomy, has gyn exams done by Dr Lysle Rubens; she has scheduled visit with him next month. Last visit to Dr Barry Dienes was 04-2013, with year follow up mentioned in that note.  Patient is not aware of any changes on breast self exam. She feels that she is tolerating the Aromasin well. She denies increase in occasional arthritis-type joint discomfort, variously hands, hips, back. She feels more hot with the aromatase inhibitor in hot weather, so takes only half tablet instead of full dose several days weekly during summer. It is not clear from history that she is really describing hot flashes, and I have encouraged her to take full tablet if possible. She has had no symptoms of blood clots. I do not have lipid information, tho I believe Dr Lysle Rubens follows this.  She does not have central catheter. She does not request flu vaccine here today.     ONCOLOGIC HISTORY Patient was found to have possible abnormalities in both breasts on screening mammograms at [facility] 01-2013, with diagnostic mammograms and US showing concern on right and benign findings on left. She had core needle biopsy on right 03-10-13 [facility] [phone]) with LCIS with necrosis and calcifications, then  excisional biopsy by Dr Barry Dienes 05-11-13 [phone]) without invasive tumor. She began tamoxifen in preventative attempt in 06-2013, but apparently stopped this due to pain in right breast in ~ 08-2013, and was subsequently changed to Aromasin.  Review of systems as above, also: Good energy, good appetite, no recent infectious illness. No new or different pain. No GI problems. No LE swelling. No bleeding. She is active at home. Remainder of 10 point Review of Systems negative.  Past Medical/Surgical History Cholecystectomy Bunion surgery both feet Para 2 Menopause ~ age 74 No hysterectomy  HTN GERD  Social History: married, never smoker  Family History No cancer known  Objective:  Vital signs in last 24 hours:  BP 167/78  Pulse 62  Temp(Src) 98.3 F (36.8 C) (Oral)  Resp 18  Ht 5' (1.524 m)  Wt 144 lb (65.318 kg)  BMI 28.12 kg/m2 Weight down 3 lbs from 11-2013 Alert, oriented and appropriate. Ambulatory without difficulty.  No alopecia  HEENT:PERRL, sclerae not icteric. Oral mucosa moist without lesions, posterior pharynx clear.  Neck supple. No JVD.  Lymphatics:no cervical,suraclavicular, axillary adenopathy Resp: clear to auscultation bilaterally and normal percussion bilaterally Cardio: regular rate and rhythm. No gallop. GI: soft, nontender, not distended, no mass or organomegaly. Normally active bowel sounds. Laparoscopic cholecystectomy incisions not remarkable. Musculoskeletal/ Extremities: without pitting edema, cords, tenderness Neuro: no peripheral neuropathy. Otherwise nonfocal. PSYCH appropriate mood and affect Skin without rash, ecchymosis, petechiae Breasts: Right lateral lumpectomy scar well healed and not remarkable. Some minimal irregularities bilaterally but without dominant mass, skin or nipple findings. Axillae benign.   Lab Results:  Results for orders placed in visit on 07/12/14  CBC WITH DIFFERENTIAL      Result Value Ref Range   WBC 9.0  3.9 -  10.3 10e3/uL   NEUT# 5.0  1.5 - 6.5 10e3/uL   HGB 12.8  11.6 - 15.9 g/dL   HCT 40.0  34.8 - 46.6 %   Platelets 227  145 - 400 10e3/uL   MCV 79.0 (*) 79.5 - 101.0 fL   MCH 25.2  25.1 - 34.0 pg   MCHC 31.9  31.5 - 36.0 g/dL   RBC 5.07  3.70 - 5.45 10e6/uL   RDW 14.6 (*) 11.2 - 14.5 %   lymph# 3.0  0.9 - 3.3 10e3/uL   MONO# 0.6  0.1 - 0.9 10e3/uL   Eosinophils Absolute 0.3  0.0 - 0.5 10e3/uL   Basophils Absolute 0.0  0.0 - 0.1 10e3/uL   NEUT% 56.0  38.4 - 76.8 %   LYMPH% 33.1  14.0 - 49.7 %   MONO% 7.1  0.0 - 14.0 %   EOS% 3.4  0.0 - 7.0 %   BASO% 0.4  0.0 - 2.0 %  COMPREHENSIVE METABOLIC PANEL (IR48)      Result Value Ref Range   Sodium 141  136 - 145 mEq/L   Potassium 4.2  3.5 - 5.1 mEq/L   Chloride 104  98 - 109 mEq/L   CO2 28  22 - 29 mEq/L   Glucose 99  70 - 140 mg/dl   BUN 13.9  7.0 - 26.0 mg/dL   Creatinine 1.0  0.6 - 1.1 mg/dL   Total Bilirubin 0.59  0.20 - 1.20 mg/dL   Alkaline Phosphatase 63  40 - 150 U/L   AST 22  5 - 34 U/L   ALT 23  0 - 55 U/L   Total Protein 7.1  6.4 - 8.3 g/dL   Albumin 4.0  3.5 - 5.0 g/dL   Calcium 9.8  8.4 - 10.4 mg/dL   Anion Gap 9  3 - 11 mEq/L   Copies of labs given to patient to take to Dr Glenna Durand upcoming visit.  Studies/Results: DIGITAL DIAGNOSTIC BILATERAL MAMMOGRAM WITH CAD   02-25-2014 COMPARISON: [facility]  ACR Breast Density Category c: The breast tissue is heterogeneously  dense, which may obscure small masses.  FINDINGS:  Postsurgical change upper outer right breast. No concerning masses,  calcifications or nonsurgical architectural distortion identified  within either breast. Biopsy marking clip located anteriorly from  previous benign ultrasound-guided biopsy.  Mammographic images were processed with CAD.  IMPRESSION:  No mammographic evidence for malignancy  RECOMMENDATION:  Screening mammogram in one year.(Code:SM-B-01Y)  I have discussed the findings and recommendations with the patient.  Results were also provided in  writing at the conclusion of the  visit. If applicable, a reminder letter will be sent to the patient  regarding the next appointment.  BI-RADS CATEGORY 1: Negative.   Medications: I have reviewed the patient's current medications. I have confirmed that she takes calcium one tablet twice daily, and D.  DISCUSSION: Patient is clinically doing well, without problems related to the aromatase inhibitor. She is in agreement with continuing that medication, planned x 5 years, thru ~ 06-2018 (as initial tamoxifen began 06-2013). I will see her back in 6 months, or sooner if needed.She will need mammograms in spring 2015 and bone density scan at least fall 2016. Continue good calcium/ D and weight bearing exercise.  Assessment/Plan:  1.LCIS right breast: post excisional biopsy 04-2013, on  hormonal blockade in preventative attempt since 06-2013, presently aromasin. Will continue hormonal blocker for total 5 years unless problems. I will see her back in 6 mo. Yearly mammograms. 2.osteopenia by DEXA 07-28-2013. On good Ca++ and D, active. On the high risk aromatase inhibitor she needs repeat bone density at least fall 2016; if bone density decreasing then, would go to yearly scans due to high risk medication 3.gyn exams by PCP 4.HTN 5.post cholecystectomy and foot surgery    All questions answered. She knows to call prior to next schedued visit if needed. Cc this note to Dr Lysle Rubens and Dr Barry Dienes. Time spent 30 min including >50% counseling and coordination of care.   Cayson Kalb P, MD   07/12/2014, 3:51 PM

## 2014-07-14 ENCOUNTER — Telehealth: Payer: Self-pay | Admitting: Oncology

## 2014-07-14 NOTE — Telephone Encounter (Signed)
per pof to sch pt appt for March 2016-[facility] sch not opened yet-Printed & gave to Kathrine Cords

## 2014-08-14 ENCOUNTER — Telehealth: Payer: Self-pay | Admitting: Oncology

## 2014-08-14 NOTE — Telephone Encounter (Signed)
, °

## 2014-08-30 ENCOUNTER — Encounter: Payer: Self-pay | Admitting: Oncology

## 2014-09-20 ENCOUNTER — Other Ambulatory Visit: Payer: Self-pay | Admitting: *Deleted

## 2014-09-20 DIAGNOSIS — D0500 Lobular carcinoma in situ of unspecified breast: Secondary | ICD-10-CM

## 2014-09-20 MED ORDER — EXEMESTANE 25 MG PO TABS: 25.0000 mg | ORAL_TABLET | Freq: Every day | ORAL | Status: DC

## 2014-10-04 ENCOUNTER — Ambulatory Visit
Admission: RE | Admit: 2014-10-04 | Discharge: 2014-10-04 | Disposition: A | Discharge status: Home / Self Care | Payer: BC Managed Care – PPO | Source: Ambulatory Visit | Attending: Internal Medicine | Admitting: Internal Medicine

## 2014-10-04 ENCOUNTER — Other Ambulatory Visit: Payer: Self-pay | Admitting: Internal Medicine

## 2014-10-04 DIAGNOSIS — M549 Dorsalgia, unspecified: Secondary | ICD-10-CM

## 2014-10-04 DIAGNOSIS — M25512 Pain in left shoulder: Secondary | ICD-10-CM

## 2015-01-04 ENCOUNTER — Other Ambulatory Visit: Payer: Self-pay | Admitting: Oncology

## 2015-01-04 DIAGNOSIS — D0501 Lobular carcinoma in situ of right breast: Secondary | ICD-10-CM

## 2015-01-10 ENCOUNTER — Other Ambulatory Visit: Payer: Self-pay | Admitting: Oncology

## 2015-01-10 ENCOUNTER — Ambulatory Visit: Payer: BC Managed Care – PPO | Admitting: Oncology

## 2015-01-10 ENCOUNTER — Other Ambulatory Visit: Payer: BC Managed Care – PPO

## 2015-01-10 DIAGNOSIS — D0501 Lobular carcinoma in situ of right breast: Secondary | ICD-10-CM

## 2015-01-10 DIAGNOSIS — R922 Inconclusive mammogram: Secondary | ICD-10-CM

## 2015-01-10 NOTE — Progress Notes (Signed)
Medical Oncology  [facility] MD today. Due mammograms ~ 02-28-15. Orders placed for mammograms then to reschedule to this MD after mammograms in May.  Godfrey Pick, MD

## 2015-01-11 ENCOUNTER — Telehealth: Payer: Self-pay | Admitting: Oncology

## 2015-01-11 NOTE — Telephone Encounter (Signed)
S.w pts husband and a calendar was mailed as well  anne

## 2015-02-14 ENCOUNTER — Ambulatory Visit
Admission: RE | Admit: 2015-02-14 | Discharge: 2015-02-14 | Disposition: A | Discharge status: Home / Self Care | Payer: 59 | Source: Ambulatory Visit | Attending: Internal Medicine | Admitting: Internal Medicine

## 2015-02-14 ENCOUNTER — Other Ambulatory Visit: Payer: Self-pay | Admitting: Internal Medicine

## 2015-02-14 DIAGNOSIS — Z0000 Encounter for general adult medical examination without abnormal findings: Secondary | ICD-10-CM

## 2015-02-14 DIAGNOSIS — M5489 Other dorsalgia: Secondary | ICD-10-CM

## 2015-02-28 ENCOUNTER — Ambulatory Visit
Admission: RE | Admit: 2015-02-28 | Discharge: 2015-02-28 | Disposition: A | Discharge status: Home / Self Care | Payer: 59 | Source: Ambulatory Visit | Attending: Oncology | Admitting: Oncology

## 2015-02-28 DIAGNOSIS — R922 Inconclusive mammogram: Secondary | ICD-10-CM

## 2015-02-28 DIAGNOSIS — D0501 Lobular carcinoma in situ of right breast: Secondary | ICD-10-CM

## 2015-03-06 ENCOUNTER — Other Ambulatory Visit: Payer: Self-pay | Admitting: Oncology

## 2015-03-07 ENCOUNTER — Telehealth: Payer: Self-pay | Admitting: Oncology

## 2015-03-07 ENCOUNTER — Other Ambulatory Visit (HOSPITAL_BASED_OUTPATIENT_CLINIC_OR_DEPARTMENT_OTHER): Payer: 59

## 2015-03-07 ENCOUNTER — Ambulatory Visit (HOSPITAL_BASED_OUTPATIENT_CLINIC_OR_DEPARTMENT_OTHER): Payer: 59 | Admitting: Oncology

## 2015-03-07 ENCOUNTER — Encounter: Payer: Self-pay | Admitting: Oncology

## 2015-03-07 VITALS — BP 147/61 | HR 66 | Temp 98.0°F | Resp 18 | Ht 60.0 in | Wt 143.6 lb

## 2015-03-07 DIAGNOSIS — D0510 Intraductal carcinoma in situ of unspecified breast: Secondary | ICD-10-CM

## 2015-03-07 DIAGNOSIS — E2839 Other primary ovarian failure: Secondary | ICD-10-CM

## 2015-03-07 DIAGNOSIS — Z79899 Other long term (current) drug therapy: Secondary | ICD-10-CM

## 2015-03-07 DIAGNOSIS — E119 Type 2 diabetes mellitus without complications: Secondary | ICD-10-CM | POA: Diagnosis not present

## 2015-03-07 DIAGNOSIS — M8580 Other specified disorders of bone density and structure, unspecified site: Secondary | ICD-10-CM | POA: Diagnosis not present

## 2015-03-07 DIAGNOSIS — D0501 Lobular carcinoma in situ of right breast: Secondary | ICD-10-CM

## 2015-03-07 DIAGNOSIS — I10 Essential (primary) hypertension: Secondary | ICD-10-CM | POA: Diagnosis not present

## 2015-03-07 LAB — CBC WITH DIFFERENTIAL/PLATELET
BASO%: 0.2 % (ref 0.0–2.0)
Basophils Absolute: 0 10*3/uL (ref 0.0–0.1)
EOS%: 3.6 % (ref 0.0–7.0)
Eosinophils Absolute: 0.3 10*3/uL (ref 0.0–0.5)
HEMATOCRIT: 38.2 % (ref 34.8–46.6)
HGB: 12.5 g/dL (ref 11.6–15.9)
LYMPH%: 36.8 % (ref 14.0–49.7)
MCH: 25.8 pg (ref 25.1–34.0)
MCHC: 32.7 g/dL (ref 31.5–36.0)
MCV: 78.9 fL — AB (ref 79.5–101.0)
MONO#: 0.8 10*3/uL (ref 0.1–0.9)
MONO%: 9.4 % (ref 0.0–14.0)
NEUT#: 4.2 10*3/uL (ref 1.5–6.5)
NEUT%: 50 % (ref 38.4–76.8)
Platelets: 200 10*3/uL (ref 145–400)
RBC: 4.84 10*6/uL (ref 3.70–5.45)
RDW: 13.9 % (ref 11.2–14.5)
WBC: 8.4 10*3/uL (ref 3.9–10.3)
lymph#: 3.1 10*3/uL (ref 0.9–3.3)

## 2015-03-07 LAB — COMPREHENSIVE METABOLIC PANEL (CC13)
ALBUMIN: 3.9 g/dL (ref 3.5–5.0)
ALT: 20 U/L (ref 0–55)
AST: 20 U/L (ref 5–34)
Alkaline Phosphatase: 56 U/L (ref 40–150)
Anion Gap: 12 mEq/L — ABNORMAL HIGH (ref 3–11)
BUN: 14.7 mg/dL (ref 7.0–26.0)
CO2: 25 mEq/L (ref 22–29)
Calcium: 9.8 mg/dL (ref 8.4–10.4)
Chloride: 101 mEq/L (ref 98–109)
Creatinine: 0.9 mg/dL (ref 0.6–1.1)
EGFR: 69 mL/min/{1.73_m2} — ABNORMAL LOW (ref >90)
Glucose: 100 mg/dl (ref 70–140)
POTASSIUM: 4.3 meq/L (ref 3.5–5.1)
SODIUM: 138 meq/L (ref 136–145)
TOTAL PROTEIN: 6.7 g/dL (ref 6.4–8.3)
Total Bilirubin: 0.56 mg/dL (ref 0.20–1.20)

## 2015-03-07 NOTE — Progress Notes (Signed)
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 Most recent bilateral tomo mammograms were done at [facility] 02-28-2015, with heterogeneously dense breast tissue but otherwise no mammographic findings of concern. She had bone density scan at [facility] 07-28-13 with osteopenia; she should have repeat bone density scan in late Sept 2016 particularly as she is on the high risk aromatase inhibitor.  Patient is followed regularly by Dr Lysle Rubens as primary; she does not have regular visits now with Dr Barry Dienes.  Patient has been generally well since she was here last, with no complaints that seem referable to the LCIS diagnosis or the aromatase inhibitor. She has had symptoms of viral URI for past 2 days, with clear rhinorrhea and NP cough, husband just over similar illness. She has had no fever or SOB. She has not been aware of any changes in breasts.   No genetics testing   ONCOLOGIC HISTORY Patient was found to have possible abnormalities in both breasts on screening mammograms at [facility] 01-2013, with diagnostic mammograms and US showing concern on right and benign findings on left. She had core needle biopsy on right 03-10-13 [facility] [phone]) with LCIS with necrosis and calcifications, then excisional biopsy by Dr Barry Dienes 05-11-13 [phone]) without invasive tumor. She began tamoxifen in preventative attempt in 06-2013, but apparently stopped this due to pain in right breast in ~ 08-2013, and was subsequently changed to Aromasin.    Review of systems as above, also: PAP not done this year (by PCP). Borderline DM, following diet closely now. Walks 2+ miles daily on weekends, but commutes to [place] weekdays for work so unable to get regular exercise then. No arthralgia  symptoms. "Cholesterol is good" followed by PCP Remainder of 10 point Review of Systems negative.  Objective:  Vital signs in last 24 hours:  BP 147/61 mmHg  Pulse 66  Temp(Src) 98 F (36.7 C) (Oral)  Resp 18  Ht 5' (1.524 m)  Wt 143 lb 9.6 oz (65.137 kg)  BMI 28.05 kg/m2 Weight down 1 lb Alert, oriented and appropriate. Ambulatory without difficulty. Does not appear ill.  HEENT:PERRL, sclerae not icteric. Oral mucosa moist without lesions, posterior pharynx clear. No significant rhinorrhea Neck supple. No JVD.  Lymphatics:no cervical,supraclavicular, axillary or inguinal adenopathy Resp: clear to auscultation bilaterally and normal percussion bilaterally Cardio: regular rate and rhythm. No gallop. GI: soft, nontender, not distended, no mass or organomegaly. Normally active bowel sounds. Musculoskeletal/ Extremities: without pitting edema, cords, tenderness Neuro: no peripheral neuropathy. Otherwise nonfocal Skin without rash, ecchymosis, petechiae Breasts: right lateral lumpectomy scar well healed, otherwise bilaterally without dominant mass, skin or nipple findings. Axillae benign.   Lab Results:  Results for orders placed or performed in visit on 03/07/15  CBC with Differential  Result Value Ref Range   WBC 8.4 3.9 - 10.3 10e3/uL   NEUT# 4.2 1.5 - 6.5 10e3/uL   HGB 12.5 11.6 - 15.9 g/dL   HCT 38.2 34.8 - 46.6 %   Platelets 200 145 - 400 10e3/uL   MCV 78.9 (L) 79.5 - 101.0 fL   MCH 25.8 25.1 - 34.0 pg   MCHC 32.7 31.5 - 36.0 g/dL   RBC 4.84 3.70 - 5.45 10e6/uL   RDW 13.9 11.2 - 14.5 %   lymph# 3.1 0.9 - 3.3  10e3/uL   MONO# 0.8 0.1 - 0.9 10e3/uL   Eosinophils Absolute 0.3 0.0 - 0.5 10e3/uL   Basophils Absolute 0.0 0.0 - 0.1 10e3/uL   NEUT% 50.0 38.4 - 76.8 %   LYMPH% 36.8 14.0 - 49.7 %   MONO% 9.4 0.0 - 14.0 %   EOS% 3.6 0.0 - 7.0 %   BASO% 0.2 0.0 - 2.0 %  Comprehensive metabolic panel (Cmet) - [facility]  Result Value Ref Range   Sodium 138 136 - 145 mEq/L    Potassium 4.3 3.5 - 5.1 mEq/L   Chloride 101 98 - 109 mEq/L   CO2 25 22 - 29 mEq/L   Glucose 100 70 - 140 mg/dl   BUN 14.7 7.0 - 26.0 mg/dL   Creatinine 0.9 0.6 - 1.1 mg/dL   Total Bilirubin 0.56 0.20 - 1.20 mg/dL   Alkaline Phosphatase 56 40 - 150 U/L   AST 20 5 - 34 U/L   ALT 20 0 - 55 U/L   Total Protein 6.7 6.4 - 8.3 g/dL   Albumin 3.9 3.5 - 5.0 g/dL   Calcium 9.8 8.4 - 10.4 mg/dL   Anion Gap 12 (H) 3 - 11 mEq/L   EGFR 69 (L) >90 ml/min/1.73 m2     Studies/Results:  No results found.  Medications: I have reviewed the patient's current medications. She tells me now that she takes only 1/2 of aromasin tablet daily "the whole tablet is too strong"  DISCUSSION: I have explained that lower dose of AI may not be as useful in breast cancer prevention, and have encouraged her to try full dose at least at intervals to see if able to tolerate now that she has been on the medication for a while.  Assessment/Plan:  1.LCIS right breast: post excisional biopsy 04-2013, on hormonal blockade in preventative attempt since 06-2013, presently aromasin. Will continue hormonal blocker for total 5 years unless problems. I will see her back in 6 mo. Yearly mammograms. 2.osteopenia by DEXA 07-28-2013. On good Ca++ and D, active. On the high risk aromatase inhibitor she needs repeat bone density at least fall 2016; if bone density decreasing then, would go to yearly scans due to high risk medication 3.gyn exams by PCP 4.HTN 5.post cholecystectomy and foot surgery  6.minimal symptoms of viral URI: encouraged to push fluids and rest 7.diet controlled DM  All questions answered. Patient knows to call prior to next scheduled visit if concerns. Cc Dr Lysle Rubens Time spent 15 min including >50% counseling and coordination of care.  LIVESAY,LENNIS P, MD   03/07/2015, 10:05 PM

## 2015-03-07 NOTE — Telephone Encounter (Signed)
pof not sent yet but patient states its a 53month appointment,appointment made and avs printed for patient

## 2015-03-09 DIAGNOSIS — E2839 Other primary ovarian failure: Secondary | ICD-10-CM | POA: Insufficient documentation

## 2015-03-09 DIAGNOSIS — Z79899 Other long term (current) drug therapy: Secondary | ICD-10-CM | POA: Insufficient documentation

## 2015-03-09 DIAGNOSIS — M8580 Other specified disorders of bone density and structure, unspecified site: Secondary | ICD-10-CM | POA: Insufficient documentation

## 2015-03-09 DIAGNOSIS — D0501 Lobular carcinoma in situ of right breast: Secondary | ICD-10-CM | POA: Insufficient documentation

## 2015-03-10 ENCOUNTER — Telehealth: Payer: Self-pay | Admitting: Oncology

## 2015-03-10 NOTE — Telephone Encounter (Signed)
dexa appointment made and calendar/letter mailed to patient

## 2015-08-01 ENCOUNTER — Other Ambulatory Visit: Payer: 59

## 2015-08-26 ENCOUNTER — Telehealth: Payer: Self-pay | Admitting: Oncology

## 2015-08-26 NOTE — Telephone Encounter (Signed)
Called to reschedule appointments [place]

## 2015-09-05 ENCOUNTER — Ambulatory Visit: Payer: 59 | Admitting: Oncology

## 2015-09-05 ENCOUNTER — Other Ambulatory Visit: Payer: 59

## 2015-10-16 ENCOUNTER — Other Ambulatory Visit: Payer: Self-pay | Admitting: Oncology

## 2015-10-16 DIAGNOSIS — D0501 Lobular carcinoma in situ of right breast: Secondary | ICD-10-CM

## 2015-10-17 ENCOUNTER — Telehealth: Payer: Self-pay | Admitting: Oncology

## 2015-10-17 ENCOUNTER — Other Ambulatory Visit (HOSPITAL_BASED_OUTPATIENT_CLINIC_OR_DEPARTMENT_OTHER): Payer: 59

## 2015-10-17 ENCOUNTER — Encounter: Payer: Self-pay | Admitting: Oncology

## 2015-10-17 ENCOUNTER — Ambulatory Visit (HOSPITAL_BASED_OUTPATIENT_CLINIC_OR_DEPARTMENT_OTHER): Payer: 59 | Admitting: Oncology

## 2015-10-17 VITALS — BP 160/68 | HR 75 | Temp 98.4°F | Resp 18 | Ht 60.0 in | Wt 140.0 lb

## 2015-10-17 DIAGNOSIS — E119 Type 2 diabetes mellitus without complications: Secondary | ICD-10-CM | POA: Diagnosis not present

## 2015-10-17 DIAGNOSIS — Z1239 Encounter for other screening for malignant neoplasm of breast: Secondary | ICD-10-CM

## 2015-10-17 DIAGNOSIS — E2839 Other primary ovarian failure: Secondary | ICD-10-CM

## 2015-10-17 DIAGNOSIS — J069 Acute upper respiratory infection, unspecified: Secondary | ICD-10-CM

## 2015-10-17 DIAGNOSIS — D0501 Lobular carcinoma in situ of right breast: Secondary | ICD-10-CM

## 2015-10-17 DIAGNOSIS — M8580 Other specified disorders of bone density and structure, unspecified site: Secondary | ICD-10-CM

## 2015-10-17 DIAGNOSIS — Z79899 Other long term (current) drug therapy: Secondary | ICD-10-CM

## 2015-10-17 LAB — CBC WITH DIFFERENTIAL/PLATELET
BASO%: 0.5 % (ref 0.0–2.0)
Basophils Absolute: 0 10*3/uL (ref 0.0–0.1)
EOS ABS: 0.3 10*3/uL (ref 0.0–0.5)
EOS%: 3.5 % (ref 0.0–7.0)
HCT: 40.8 % (ref 34.8–46.6)
HGB: 12.8 g/dL (ref 11.6–15.9)
LYMPH%: 29.1 % (ref 14.0–49.7)
MCH: 24.8 pg — ABNORMAL LOW (ref 25.1–34.0)
MCHC: 31.4 g/dL — AB (ref 31.5–36.0)
MCV: 79.1 fL — ABNORMAL LOW (ref 79.5–101.0)
MONO#: 0.6 10*3/uL (ref 0.1–0.9)
MONO%: 6.7 % (ref 0.0–14.0)
NEUT%: 60.2 % (ref 38.4–76.8)
NEUTROS ABS: 5.4 10*3/uL (ref 1.5–6.5)
Platelets: 212 10*3/uL (ref 145–400)
RBC: 5.16 10*6/uL (ref 3.70–5.45)
RDW: 14.6 % — ABNORMAL HIGH (ref 11.2–14.5)
WBC: 9 10*3/uL (ref 3.9–10.3)
lymph#: 2.6 10*3/uL (ref 0.9–3.3)

## 2015-10-17 LAB — COMPREHENSIVE METABOLIC PANEL
ALT: 32 U/L (ref 0–55)
AST: 24 U/L (ref 5–34)
Albumin: 3.9 g/dL (ref 3.5–5.0)
Alkaline Phosphatase: 103 U/L (ref 40–150)
Anion Gap: 11 mEq/L (ref 3–11)
BUN: 14.5 mg/dL (ref 7.0–26.0)
CHLORIDE: 101 meq/L (ref 98–109)
CO2: 28 meq/L (ref 22–29)
CREATININE: 0.9 mg/dL (ref 0.6–1.1)
Calcium: 10.1 mg/dL (ref 8.4–10.4)
EGFR: 67 mL/min/{1.73_m2} — ABNORMAL LOW (ref >90)
Glucose: 120 mg/dl (ref 70–140)
Potassium: 4.1 mEq/L (ref 3.5–5.1)
Sodium: 139 mEq/L (ref 136–145)
Total Bilirubin: 0.33 mg/dL (ref 0.20–1.20)
Total Protein: 7.5 g/dL (ref 6.4–8.3)

## 2015-10-17 NOTE — Progress Notes (Signed)
OFFICE PROGRESS NOTE   October 17, 2015   Physicians:  INTERVAL HISTORY:  Marcy Panning), [place], [facility]  Patient is seen, alone for visit, in scheduled 6 month follow up of ongoing hormonal blockade being used in preventative fashion for LCIS right breast. She began tamoxifen by Dr Humphrey Rolls in 06-2013, subsequently changed to aromasin ~ 08-2013 due to poor tolerance to the tamoxifen. She had bilateral tomo mammograms at [facility] 02-28-2015. She did not have the DEXA as planned 06-2015, which I have again encouraged her to do. The last DEXA was 06-2013 at [facility] with osteopenia.  Patient has felt well until viral URI x 1 week, with head congestion, nonproductive slight cough, no fever and no SOB. She has had no noted changes in breasts bilaterally. She is tolerating the aromasin without apparent difficulty, tho she mentions that she may not want to continue this for full five years (thru 06-2018). She is walking 2 miles daily with husband in neighborhood. Appetite and energy have been good, no pain, no GI symptoms, no respiratory symptoms prior to URI. No bleeding. Remainder of 10 point Review of Systems negative.  No genetics testing Flu vaccine Oct 2016 No central catheter  ONCOLOGIC HISTORY Patient was found to have possible abnormalities in both breasts on screening mammograms at [facility] 01-2013, with diagnostic mammograms and US showing concern on right and benign findings on left. She had core needle biopsy on right 03-10-13 [facility] [phone]) with LCIS with necrosis and calcifications, then excisional biopsy by Dr Barry Dienes 05-11-13 [phone]) without invasive tumor. She began tamoxifen in preventative attempt in 06-2013, but apparently stopped this due to pain in right breast in ~ 08-2013, and was subsequently changed to Aromasin.    Objective:  Vital signs in last 24 hours:  BP 160/68 mmHg  Pulse 75  Temp(Src) 98.4 F (36.9 C) (Oral)  Resp 18  Ht 5'  (1.524 m)  Wt 140 lb (63.504 kg)  BMI 27.34 kg/m2  SpO2 100% Weight down 3 lbs Alert, oriented and appropriate. Ambulatory without difficulty. Nasal congestion, no cough, respirations not labored.  HEENT:PERRL, sclerae not icteric. Oral mucosa moist without lesions, posterior pharynx clear. Nasal turbinates boggy without purulent drainage Neck supple. No JVD.  Lymphatics:no cervical,supraclavicular, axillary adenopathy Resp: clear to auscultation bilaterally and normal percussion bilaterally Cardio: regular rate and rhythm. No gallop. GI: soft, nontender, not distended, no mass or organomegaly. Normally active bowel sounds. Musculoskeletal/ Extremities: back not tender, extremities without pitting edema, cords, tenderness Neuro:  nonfocal Skin without rash, ecchymosis, petechiae Breasts:Right lumpectomy scar without changes of concern,, otherwise bilaterally without dominant mass, skin or nipple findings. Axillae benign. Portacath-without erythema or tenderness  Lab Results:  Results for orders placed or performed in visit on 10/17/15  CBC with Differential  Result Value Ref Range   WBC 9.0 3.9 - 10.3 10e3/uL   NEUT# 5.4 1.5 - 6.5 10e3/uL   HGB 12.8 11.6 - 15.9 g/dL   HCT 40.8 34.8 - 46.6 %   Platelets 212 145 - 400 10e3/uL   MCV 79.1 (L) 79.5 - 101.0 fL   MCH 24.8 (L) 25.1 - 34.0 pg   MCHC 31.4 (L) 31.5 - 36.0 g/dL   RBC 5.16 3.70 - 5.45 10e6/uL   RDW 14.6 (H) 11.2 - 14.5 %   lymph# 2.6 0.9 - 3.3 10e3/uL   MONO# 0.6 0.1 - 0.9 10e3/uL   Eosinophils Absolute 0.3 0.0 - 0.5 10e3/uL   Basophils Absolute 0.0 0.0 - 0.1 10e3/uL  NEUT% 60.2 38.4 - 76.8 %   LYMPH% 29.1 14.0 - 49.7 %   MONO% 6.7 0.0 - 14.0 %   EOS% 3.5 0.0 - 7.0 %   BASO% 0.5 0.0 - 2.0 %  Comprehensive metabolic panel  Result Value Ref Range   Sodium 139 136 - 145 mEq/L   Potassium 4.1 3.5 - 5.1 mEq/L   Chloride 101 98 - 109 mEq/L   CO2 28 22 - 29 mEq/L   Glucose 120 70 - 140 mg/dl   BUN 14.5 7.0 - 26.0 mg/dL    Creatinine 0.9 0.6 - 1.1 mg/dL   Total Bilirubin 0.33 0.20 - 1.20 mg/dL   Alkaline Phosphatase 103 40 - 150 U/L   AST 24 5 - 34 U/L   ALT 32 0 - 55 U/L   Total Protein 7.5 6.4 - 8.3 g/dL   Albumin 3.9 3.5 - 5.0 g/dL   Calcium 10.1 8.4 - 10.4 mg/dL   Anion Gap 11 3 - 11 mEq/L   EGFR 67 (L) >90 ml/min/1.73 m2     Studies/Results:  No results found.  Mammograms ordered for early 02-2016 DEXA ordered for same date as mammograms, also at [facility], as patient did not keep separate appointment for this previously  Medications: I have reviewed the patient's current medications.   DISCUSSION As above, patient seems surprised at the recommended length of treatment of the AI and may prefer to stop sooner than 5 years (06-2018). She agrees to continue AI for now but understands that we can discuss length of treatment when I see her back after DEXA. I have explained need for DEXA due to osteopenia in 2014 and possible side effect of the aromatae inhibitor being decrease in bone density. I have encouraged her to continue the good weight bearing exercise.   She tells me "flu shot did not work" due to present URI, discussed.  Assessment/Plan:  1.LCIS right breast: post excisional biopsy 04-2013, on hormonal blockade in preventative attempt since 06-2013, presently aromasin. Will continue hormonal blocker for total 5 years unless problems or if patient prefers to DC. I will see her back in 6 mo. Yearly mammograms due 02-2016. 2.osteopenia by DEXA 07-28-2013. On good Ca++ and D, active. On the high risk aromatase inhibitor she needs repeat bone density, tho she did not have this done in fall as planned. She seems more willing to have the DEXA together with mammograms, which I have requested. 3.gyn exams by PCP 4.HTN 5.post cholecystectomy and foot surgery  6. viral URI: improving, No indication for antibiotics.  7.diet controlled DM    All questions answered and patient seems to understand  discussion and to be in agreement with recommendations and plans. Time spent 25 min including >50% counseling and coordination of care. Cc Dr Izetta Dakin, MD   10/17/2015, 2:28 PM

## 2015-10-17 NOTE — Telephone Encounter (Signed)
Appointments made and avs printed for patient °

## 2015-10-19 ENCOUNTER — Other Ambulatory Visit: Payer: Self-pay

## 2015-10-19 DIAGNOSIS — D0500 Lobular carcinoma in situ of unspecified breast: Secondary | ICD-10-CM

## 2015-10-19 MED ORDER — EXEMESTANE 25 MG PO TABS: 25.0000 mg | ORAL_TABLET | Freq: Every day | ORAL | Status: DC

## 2015-11-17 ENCOUNTER — Other Ambulatory Visit: Payer: 59

## 2015-11-17 ENCOUNTER — Inpatient Hospital Stay: Admission: RE | Admit: 2015-11-17 | Payer: 59 | Source: Ambulatory Visit

## 2015-12-29 ENCOUNTER — Telehealth: Payer: Self-pay | Admitting: *Deleted

## 2015-12-29 NOTE — Telephone Encounter (Signed)
Re PCP decreased exemestane to 1/2 tablet daily  If she is tolerating 1/2 tablet, that is ok for now as she is on this in preventative attempt (high risk but no diagnosis of invasive breast cancer)  Remind her that she is to have mammogram and bone density scan in May before I see her again in June. The bone density information will be helpful as we decide if she should continue exemestane.  thanks

## 2015-12-29 NOTE — Telephone Encounter (Signed)
"  Ashley Pratt is a patient of Dr. Marko Plume who was instructed to take a whole exemestane pill.  Her B/P increased.  She saw her doctor and was told to take half the pill.  She now takes half.  Please call me at [phone]."  Voicemail forwarded to collaborative.

## 2015-12-29 NOTE — Telephone Encounter (Signed)
Message below provided to patient's husband. [facility]

## 2016-03-05 ENCOUNTER — Encounter: Payer: Self-pay | Admitting: Cardiovascular Disease

## 2016-03-05 ENCOUNTER — Ambulatory Visit (INDEPENDENT_AMBULATORY_CARE_PROVIDER_SITE_OTHER): Payer: BLUE CROSS/BLUE SHIELD | Admitting: Cardiovascular Disease

## 2016-03-05 VITALS — BP 180/70 | HR 65 | Ht 62.0 in | Wt 140.8 lb

## 2016-03-05 DIAGNOSIS — R0989 Other specified symptoms and signs involving the circulatory and respiratory systems: Secondary | ICD-10-CM

## 2016-03-05 DIAGNOSIS — R0789 Other chest pain: Secondary | ICD-10-CM | POA: Diagnosis not present

## 2016-03-05 DIAGNOSIS — Z7189 Other specified counseling: Secondary | ICD-10-CM

## 2016-03-05 DIAGNOSIS — I350 Nonrheumatic aortic (valve) stenosis: Secondary | ICD-10-CM

## 2016-03-05 DIAGNOSIS — Z7689 Persons encountering health services in other specified circumstances: Secondary | ICD-10-CM

## 2016-03-05 MED ORDER — AMLODIPINE BESYLATE 5 MG PO TABS: 5.0000 mg | ORAL_TABLET | Freq: Every day | ORAL | Status: DC

## 2016-03-05 MED ORDER — HYDROCHLOROTHIAZIDE 12.5 MG PO CAPS: 12.5000 mg | ORAL_CAPSULE | Freq: Every day | ORAL | Status: DC

## 2016-03-05 NOTE — Patient Instructions (Addendum)
Medication Instructions:  Your physician has recommended you make the following change in your medication:  1-START Norvasc 5 mg by mouth at noon time 2-START Hydrochlorothiazide 12.5 mg by mouth at night time 3-losartan should be taken in the morning.   Labwork: Your physician recommends that you return for lab work in: 4 weeks when having exercise tolerance test. BMET  Testing/Procedures: Your physician has requested that you have an echocardiogram. Echocardiography is a painless test that uses sound waves to create images of your heart. It provides your doctor with information about the size and shape of your heart and how well your heart's chambers and valves are working. This procedure takes approximately one hour. There are no restrictions for this procedure.  Your physician has requested that you have an exercise tolerance test in 4 weeks. For further information please visit [email or web address]. Please also follow instruction sheet, as given.  Your physician has requested that you have a carotid duplex. This test is an ultrasound of the carotid arteries in your neck. It looks at blood flow through these arteries that supply the brain with blood. Allow one hour for this exam. There are no restrictions or special instructions.   Follow-Up: Your physician wants you to follow-up next available with Dr. Johnsie Cancel.  If you need a refill on your cardiac medications before your next appointment, please call your pharmacy.

## 2016-03-05 NOTE — Progress Notes (Signed)
Patient ID: Ashley Pratt, female   DOB: [birth year], 65 y.o.   MRN: [id number]     Cardiology Office Note   Date:  03/05/2016   ID:  Ashley Pratt, DOB 06/19/1951, MRN [id number]  PCP:  Wenda Low, MD  Cardiologist:   Jenkins Rouge, MD   Chief Complaint  Patient presents with  . Establish Care    dystolic disfunction, htn      History of Present Illness: Ashley Pratt is a 65 y.o. female who presents for evaluation of dyspnea.  Primary indicated echo 2 years ago showed grade 2 diastolic dysfunction CRF;s HTN And glucose intolerance.  Reviewed echo from 02/2014  Seen by primary for physical on 02/15/16 complained of worsening exertional dyspnea. Also had previous chest Pain with acid indigestion.    Study Conclusions  - Left ventricle: The cavity size was normal. Wall thickness was normal. Systolic function was normal. The estimated ejection fraction was in the range of 60% to 65%. Doppler parameters are consistent with abnormal left ventricular relaxation (grade 1 diastolic dysfunction). - Aortic valve: AV is thickened with minimally restricted motion. Peak nad mean gradients through the valve are 23 and 14 mm Hg respectively. - Mitral valve: Mild regurgitation. - Pulmonary arteries: PA peak pressure: 63mm Hg (S).  She seems to worry about her children a lot. Daughter is in [place] teaching English and son lives with her She would not let me know what it is that concerns her She has a low sodium diet and walks 3 miles / day. Occasional tightness in chest when BP is up.  She has history of murmur and echo 2015 with mild AS She also has family history of cerebral aneurysm in 3 members. Indicates CT over 20 years ago and no recent imaging  Complains of dysphagia since March Chronic GERD on aciphex but still bad indigestion    Past Medical History  Diagnosis Date  . Hypertension   . Heart murmur   . GERD (gastroesophageal reflux disease)   . Arthritis   .  Chronic back pain   . Shortness of breath   . Colon polyps   . Osteoarthritis     of the knee left worse than right  . Prediabetes   . Malignant neoplasm of right female breast [facility])     unspecified site of breast    Past Surgical History  Procedure Laterality Date  . Cholecystectomy    . Foot osteotomy      both  feet  . Tonsillectomy    . Breast lumpectomy with needle localization Right 05/11/2013    Procedure: RIGHT BREAST NEEDLE LOCALIZATION  LUMPECTOMY;  Surgeon: Stark Klein, MD;  Location: [place];  Service: General;  Laterality: Right;  . Colonoscopy       Current Outpatient Prescriptions  Medication Sig Dispense Refill  . CALCIUM PO Take 1 tablet by mouth daily.    . Ergocalciferol (VITAMIN D2) 2000 units TABS Take 2,000 Units by mouth daily.    Marland Kitchen exemestane (AROMASIN) 25 MG tablet Take 1 tablet (25 mg total) by mouth daily after breakfast. 90 tablet 3  . losartan (COZAAR) 50 MG tablet Take 50 mg by mouth daily.    . RABEprazole (ACIPHEX) 20 MG tablet Take 20 mg by mouth daily.     No current facility-administered medications for this visit.    Allergies:   Aspirin and Penicillins    Social History:  The patient  reports that she has never smoked. She  has never used smokeless tobacco. She reports that she does not drink alcohol or use illicit drugs.   Family History:  The patient's family history is not on file.    ROS:  Please see the history of present illness.   Otherwise, review of systems are positive for none.   All other systems are reviewed and negative.    PHYSICAL EXAM: VS:  BP 180/70 mmHg  Pulse 65  Ht 5\' 2"  (1.575 m)  Wt 63.866 kg (140 lb 12.8 oz)  BMI 25.75 kg/m2  SpO2 98% , BMI Body mass index is 25.75 kg/(m^2). Affect appropriate Healthy:  appears stated age 77: normal Neck supple with no adenopathy JVP normal bilateral bruits no thyromegaly Lungs clear with no wheezing and good diaphragmatic motion Heart:  S1/S2 AS  murmur, no rub, gallop or click PMI normal Abdomen: benighn, BS positve, no tenderness, no AAA no bruit.  No HSM or HJR Distal pulses intact with no bruits No edema Neuro non-focal Skin warm and dry No muscular weakness    EKG:  05/07/13  SR rate 66 normal  03/05/16  SR rate 65 normal no LVH    Recent Labs: 10/17/2015: ALT 32; BUN 14.5; Creatinine 0.9; HGB 12.8; Platelets 212; Potassium 4.1; Sodium 139    Lipid Panel No results found for: CHOL, TRIG, HDL, CHOLHDL, VLDL, LDLCALC, LDLDIRECT    Wt Readings from Last 3 Encounters:  03/05/16 63.866 kg (140 lb 12.8 oz)  10/17/15 63.504 kg (140 lb)  03/07/15 65.137 kg (143 lb 9.6 oz)      Other studies Reviewed: Additional studies/ records that were reviewed today include: Primary care notes. Echo 2015  ECG;s see HPI .    ASSESSMENT AND PLAN:  1. Dyspnea etiology not clear.  Needs f/u echo given AS murmur Also related to poorly controlled BP 2. AS:  Exam sounds moderate S2 preserved f/u echo  3. HTN: poorly controlled add diuretic and norvasc and spread meds out. BMET  In 4 weeks  4. Chest Pain atypical may be related to poorly controlled BP f/u ETT after she has been on new BP meds  5. GERD should have referral to GI Dr Watt Climes or Buccini for EGD with associated dysphagia despite RX with aciphex 6. Breast Cancer  F/u oncology continue suppressive hormone Rx 7. Pre Diabetes  Discussed low carb diet 8. Anxiety / Depression: worries a lot about children f/u primary consider SSRI  Likely related to lability on BP 9. Bruit:  Bilateral vs referred AS murmur f/u carotid duplex     Current medicines are reviewed at length with the patient today.  The patient does not have concerns regarding medicines.  The following changes have been made:  HCTZ 12.5 mg Norvasc 5 mg  Labs/ tests ordered today include: ETT, BMET Carotid duplex and echo   No orders of the defined types were placed in this encounter.     Disposition:   FU with me  next available      Signed, Jenkins Rouge, MD  03/05/2016 3:22 PM    [facility] [address], [place], [place]  [address] Phone: [phone]; Fax: [phone]

## 2016-03-06 NOTE — Addendum Note (Signed)
Addended by: Freada Bergeron on: 03/06/2016 05:35 PM   Modules accepted: Orders

## 2016-03-07 ENCOUNTER — Other Ambulatory Visit: Payer: 59

## 2016-03-07 ENCOUNTER — Ambulatory Visit
Admission: RE | Admit: 2016-03-07 | Discharge: 2016-03-07 | Disposition: A | Discharge status: Home / Self Care | Payer: BLUE CROSS/BLUE SHIELD | Source: Ambulatory Visit | Attending: Oncology | Admitting: Oncology

## 2016-03-07 ENCOUNTER — Other Ambulatory Visit: Payer: Self-pay | Admitting: Oncology

## 2016-03-07 DIAGNOSIS — M8580 Other specified disorders of bone density and structure, unspecified site: Secondary | ICD-10-CM

## 2016-03-07 DIAGNOSIS — D0501 Lobular carcinoma in situ of right breast: Secondary | ICD-10-CM

## 2016-03-07 DIAGNOSIS — Z1239 Encounter for other screening for malignant neoplasm of breast: Secondary | ICD-10-CM

## 2016-03-13 ENCOUNTER — Other Ambulatory Visit: Payer: Self-pay | Admitting: Cardiovascular Disease

## 2016-03-13 ENCOUNTER — Ambulatory Visit (HOSPITAL_COMMUNITY)
Admission: RE | Admit: 2016-03-13 | Discharge: 2016-03-13 | Disposition: A | Discharge status: Home / Self Care | Payer: BLUE CROSS/BLUE SHIELD | Source: Ambulatory Visit | Attending: Cardiovascular Disease | Admitting: Cardiovascular Disease

## 2016-03-13 DIAGNOSIS — I6523 Occlusion and stenosis of bilateral carotid arteries: Secondary | ICD-10-CM

## 2016-03-13 DIAGNOSIS — R7303 Prediabetes: Secondary | ICD-10-CM | POA: Diagnosis not present

## 2016-03-13 DIAGNOSIS — K219 Gastro-esophageal reflux disease without esophagitis: Secondary | ICD-10-CM | POA: Insufficient documentation

## 2016-03-13 DIAGNOSIS — R4702 Dysphasia: Secondary | ICD-10-CM

## 2016-03-13 DIAGNOSIS — R0989 Other specified symptoms and signs involving the circulatory and respiratory systems: Secondary | ICD-10-CM | POA: Diagnosis not present

## 2016-03-13 DIAGNOSIS — I10 Essential (primary) hypertension: Secondary | ICD-10-CM | POA: Insufficient documentation

## 2016-03-14 ENCOUNTER — Telehealth: Payer: Self-pay

## 2016-03-14 ENCOUNTER — Other Ambulatory Visit: Payer: Self-pay | Admitting: Oncology

## 2016-03-14 NOTE — Telephone Encounter (Signed)
-----   Message from Gordy Levan, MD sent at 03/14/2016  7:58 AM EDT ----- Labs seen and need follow up: please let her know bone density is just a little low, in osteopenic range. Bone density is a little better in her low back and the same in hip as 2014. Weight bearing exercise such as walking is good. We will discuss further at next MD visit

## 2016-03-14 NOTE — Telephone Encounter (Signed)
Gave results as noted below to Husband as Ms Pflanz was on another phone conversation. Husband will inform wife of results.

## 2016-04-03 ENCOUNTER — Other Ambulatory Visit (HOSPITAL_COMMUNITY): Payer: BLUE CROSS/BLUE SHIELD

## 2016-04-03 ENCOUNTER — Other Ambulatory Visit: Payer: BLUE CROSS/BLUE SHIELD

## 2016-04-06 ENCOUNTER — Ambulatory Visit: Payer: BLUE CROSS/BLUE SHIELD | Admitting: Cardiovascular Disease

## 2016-04-10 ENCOUNTER — Other Ambulatory Visit (INDEPENDENT_AMBULATORY_CARE_PROVIDER_SITE_OTHER): Payer: BLUE CROSS/BLUE SHIELD | Admitting: *Deleted

## 2016-04-10 ENCOUNTER — Ambulatory Visit (INDEPENDENT_AMBULATORY_CARE_PROVIDER_SITE_OTHER): Payer: BLUE CROSS/BLUE SHIELD

## 2016-04-10 DIAGNOSIS — Z7689 Persons encountering health services in other specified circumstances: Secondary | ICD-10-CM

## 2016-04-10 DIAGNOSIS — R0789 Other chest pain: Secondary | ICD-10-CM | POA: Diagnosis not present

## 2016-04-10 DIAGNOSIS — Z7189 Other specified counseling: Secondary | ICD-10-CM

## 2016-04-10 DIAGNOSIS — R0989 Other specified symptoms and signs involving the circulatory and respiratory systems: Secondary | ICD-10-CM

## 2016-04-10 DIAGNOSIS — I350 Nonrheumatic aortic (valve) stenosis: Secondary | ICD-10-CM

## 2016-04-10 LAB — EXERCISE TOLERANCE TEST
CSEPED: 2 min
CSEPEW: 4.6 METS
CSEPPHR: 130 {beats}/min
Exercise duration (sec): 40 s
MPHR: 157 {beats}/min
Percent HR: 82 %
RPE: 17
Rest HR: 71 {beats}/min

## 2016-04-10 LAB — BASIC METABOLIC PANEL
BUN: 13 mg/dL (ref 7–25)
CALCIUM: 9.5 mg/dL (ref 8.6–10.4)
CO2: 26 mmol/L (ref 20–31)
CREATININE: 0.87 mg/dL (ref 0.50–0.99)
Chloride: 98 mmol/L (ref 98–110)
GLUCOSE: 88 mg/dL (ref 65–99)
Potassium: 4.3 mmol/L (ref 3.5–5.3)
SODIUM: 136 mmol/L (ref 135–146)

## 2016-04-12 ENCOUNTER — Telehealth: Payer: Self-pay | Admitting: Oncology

## 2016-04-12 NOTE — Telephone Encounter (Signed)
S/w pt's husband, advised appt chgd from 6/19 to 7/6 due to md pal. Pt's husband says they will be out of town. Gave appt for 7/17 @ 2pm.

## 2016-04-16 ENCOUNTER — Ambulatory Visit: Payer: 59 | Admitting: Oncology

## 2016-04-24 ENCOUNTER — Other Ambulatory Visit (HOSPITAL_COMMUNITY): Payer: BLUE CROSS/BLUE SHIELD

## 2016-05-03 ENCOUNTER — Ambulatory Visit: Payer: BLUE CROSS/BLUE SHIELD | Admitting: Oncology

## 2016-05-13 ENCOUNTER — Other Ambulatory Visit: Payer: Self-pay | Admitting: Oncology

## 2016-05-14 ENCOUNTER — Ambulatory Visit (HOSPITAL_BASED_OUTPATIENT_CLINIC_OR_DEPARTMENT_OTHER): Payer: BLUE CROSS/BLUE SHIELD | Admitting: Oncology

## 2016-05-14 ENCOUNTER — Encounter: Payer: Self-pay | Admitting: Oncology

## 2016-05-14 VITALS — BP 187/77 | HR 63 | Temp 98.1°F | Resp 18 | Ht 62.0 in | Wt 138.8 lb

## 2016-05-14 DIAGNOSIS — M8580 Other specified disorders of bone density and structure, unspecified site: Secondary | ICD-10-CM | POA: Diagnosis not present

## 2016-05-14 DIAGNOSIS — D0501 Lobular carcinoma in situ of right breast: Secondary | ICD-10-CM

## 2016-05-14 DIAGNOSIS — I10 Essential (primary) hypertension: Secondary | ICD-10-CM | POA: Diagnosis not present

## 2016-05-14 DIAGNOSIS — Z86000 Personal history of in-situ neoplasm of breast: Secondary | ICD-10-CM | POA: Diagnosis not present

## 2016-05-14 DIAGNOSIS — E119 Type 2 diabetes mellitus without complications: Secondary | ICD-10-CM

## 2016-05-14 NOTE — Progress Notes (Signed)
OFFICE PROGRESS NOTE   May 14, 2016   Physicians: Marcy Panning), K.Husain, [facility]. 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 LCIS was diagnosed 02-2013. She began tamoxifen by Dr Humphrey Rolls in 06-2013 but did not tolerate this, changed to aromasin 08-2013.  She had bilateral 3D mammograms at [facility] 03-07-16, heterogeneously dense breast tissue but no other findings of concern.  She had bone density scan also at [facility] 03-13-16, osteopenic, with T score -1.5 at femoral neck and -1.3 at LS. Previous DEXA was at [facility] 06-2013, with femoral neck -1.6 and LS -1.7  She had cardiology evaluation by Dr Johnsie Cancel [phone] for dyspnea, ETT no ischemia, carotid dopplers and echocardiogram negative.  She will see Dr Jordan Likes again in fall. She has not told him about stopping medications (see below)  Patient tells me that she stopped aromasin as well as antihypertensives and aciphex 3 weeks ago. She feels much less stiffness off of the aromasin, and otherwise feels better overall. BP at home 151/84 today. She and rest of family are now eating vegetarian diet. She is walking in neighborhood ~ 0500 daily. She is not aware of any changes in breasts bilaterally. She denies new or different pain, SOB, recent infectious illness, LE swelling, any bleeding.  Remainder of 10 point Review of Systems negative  No genetics testing No central catheter  ONCOLOGIC HISTORY  Patient was found to have possible abnormalities in both breasts on screening mammograms at [facility] 01-2013, with diagnostic mammograms and US showing concern on right and benign findings on left. She had core needle biopsy on right 03-10-13 [facility] [phone]) with LCIS with necrosis and calcifications, then excisional biopsy by Dr Barry Dienes 05-11-13 [phone]) without invasive tumor. She began tamoxifen in preventative attempt  in 06-2013, but apparently stopped this due to pain in right breast in ~ 08-2013, and was subsequently changed to Aromasin.   Objective:  Vital signs in last 24 hours:  BP 187/77 mmHg  Pulse 63  Temp(Src) 98.1 F (36.7 C) (Oral)  Resp 18  Ht 5\' 2"  (1.575 m)  Wt 138 lb 12.8 oz (62.959 kg)  BMI 25.38 kg/m2  SpO2 100%   Alert, oriented and appropriate. Ambulatory without difficulty, easily able to get on and off exam table. Respirations not labored   HEENT:PERRL, sclerae not icteric. Oral mucosa moist without lesions, posterior pharynx clear.  Neck supple. No JVD.  Lymphatics:no cervical,supraclavicular, axillary or inguinal adenopathy Resp: clear to auscultation bilaterally and normal percussion bilaterally Cardio: regular rate and rhythm. No gallop. GI: soft, nontender, not distended, no mass or organomegaly. Normally active bowel sounds. Surgical incision not remarkable. Musculoskeletal/ Extremities: without pitting edema, cords, tenderness Neuro: nonfocal   PSYCH appropriate mood and affect Skin without rash, ecchymosis, petechiae Breasts: right excisional biopsy scar not remarkable and otherwise bilaterally without dominant mass, skin or nipple findings. Axillae benign.  Lab Results:  Results for orders placed or performed in visit on [id number]  Basic Metabolic Panel (BMET)  Result Value Ref Range   Sodium 136 135 - 146 mmol/L   Potassium 4.3 3.5 - 5.3 mmol/L   Chloride 98 98 - 110 mmol/L   CO2 26 20 - 31 mmol/L   Glucose, Bld 88 65 - 99 mg/dL   BUN 13 7 - 25 mg/dL   Creat 0.87 0.50 - 0.99 mg/dL   Calcium 9.5 8.6 - 10.4 mg/dL  Studies/Results: Bone Density 03-07-16 Name: JAKKI, FERRIN Patient ID: [id number] Birth Date: 07/30/1951 Height: 59.0 in. Sex: Female Measured: 03/07/2016 Weight: 140.0 lbs. Indications: Estrogen Deficient, Height Loss (781.91), History of Osteopenia, Postmenopausal Fractures: None Treatments: Calcium (E943.0), Vitamin D  (E933.5)  ASSESSMENT: The BMD measured at Femur Neck Left is 0.828 g/cm2 with a T-score of -1.5. This patient is considered osteopenic according to [facility]) criteria. There has been a statistically significant increase in BMD of Lumbar spine and no statistically significant change in left hip since prior exam dated 07/28/2013.  Site Region Measured Date Measured Age YA BMD Significant CHANGE T-score DualFemur Neck Left 03/07/2016 63.3 -1.5 0.828 g/cm2  AP Spine L1-L4 03/07/2016 63.3 -1.3 1.037 g/cm2 *  World Health Organization [facility]) criteria for post-menopausal, Caucasian Women: Normal T-score at or above -1 SD Osteopenia T-score between -1 and -2.5 SD Osteoporosis T-score at or below -2.5 SD        CLINICAL DATA: Screening. History of right breast LCIS status post lumpectomy in 2014.  EXAM: 2D DIGITAL SCREENING BILATERAL MAMMOGRAM WITH CAD AND ADJUNCT TOMO  COMPARISON: Previous exam(s).  ACR Breast Density Category c: The breast tissue is heterogeneously dense, which may obscure small masses.  FINDINGS: There are stable postsurgical changes within the right breast. There are no findings suspicious for malignancy within either breast. Images were processed with CAD.  IMPRESSION: No mammographic evidence of malignancy. A result letter of this screening mammogram will be mailed directly to the patient.  RECOMMENDATION: Screening mammogram in one year. (Code:SM-B-01Y)  BI-RADS CATEGORY 2: Benign.       Medications: I have reviewed the patient's current medications, presently only multivit.  DISCUSSION Bone density and mammogram findings discussed.  Discussed patient's decision to stop aromasin, which likely was causing arthralgias/ stiffness, those side effects cumulative over time on that medication and resolve entirely within just a couple of weeks of discontinuing. She  understands that the hormonal blocker was in preventative attempt, not treating a diagnosed breast cancer. She does not want to resume any medication for this reason now. Patient will follow blood pressures regularly at home and keep written record of these for Dr Lysle Rubens. Regular weight bearing exercise and good diet are certainly appropriate.  Patient would like to follow with Dr Lysle Rubens, will not have scheduled follow up at this office tho she can be seen back here at any time if she or her other physicians feel that we can be of help. She will continue yearly mammograms, tomo/ 3D best for her due to dense breast tissue.   Assessment/Plan:  1.LCIS right breast: post excisional biopsy 04-2013, on hormonal blockade in preventative attempt from 06-2013 thru 03-2016, DCd then at patient's decision. Intolerant to tamoxifen initially. Cumulative stiffness/ arthralgias from aromasin already resolved. Yearly mammograms due 02-2017, needs tomo 3D due to dense breast tissue. 2.osteopenia by DEXA 02-2016 at [facility], slightly better than in 2014. Encouraged regular weight bearing exercise (daily walking). 3.gyn exams by PCP 4.HTN: she stopped antihypertensives 3 weeks ago, BPs reportedly lower at home than here today.  5.post cholecystectomy and foot surgery  6. diet controlled DM  All questions answered and patient is comfortable with plans above, expressed appreciation for care. Patient knows that I will be in touch with Dr Lysle Rubens by this note, including information re medication DC. TIme spent 20 min including >50% counseling and coordination of care.     Evlyn Clines, MD   05/14/2016, 3:23 PM

## 2016-08-28 ENCOUNTER — Telehealth: Payer: Self-pay

## 2016-08-28 NOTE — Telephone Encounter (Signed)
-----   Message from Verdene Rio sent at 05/28/2016  1:57 PM EDT ----- Regarding: Cancelled Echo Good afternoon Pam,  A few calls have been made to Ms. Kowalke to reschedule her echo that Dr. Johnsie Cancel ordered for her back in early May. The husband voiced that "she is not ready to have this done yet", and he was unable to give a future date as to when she might be ready. I was going to remove her from the workqueue and then a new order could be placed once she felt well enough to come in and have the procedure. Have a great day!  Mikal Plane.

## 2017-01-16 NOTE — Progress Notes (Signed)
Cardiology Office Note   Date:  01/17/2017   ID:  Ashley Pratt, DOB 10-11-1951, MRN [id number]  PCP:  Ashley Low, MD  Cardiologist:   Ashley Breeding, MD  Referring:  Ashley Low, MD  Chief Complaint  Patient presents with  . Chest Pain  . Shortness of Breath      History of Present Illness: Ashley Pratt is a 66 y.o. female who presents for evaluation of chest pain and shortness of breath.  The patient was previously seen by Dr. Johnsie Pratt last year for dyspnea.  She has some evidence of diastolic dysfunction.  Seh had an echo in 2015.  She had some mild AS as well.    She was to have a follow up echo in 2017 but did not have this done.  She also had some chest pain and was to get a POET (Plain Old Exercise Treadmill) but she did not have this done either.  She says that some family issues catheter from having her testing. She gets chest pain. This is a daily heaviness at night. It seems to be constant throughout the night but goes away when she gets up in the morning. She doesn't describe associated symptoms. She does get heartburn but this may or may not be the same thing. She's able to walk 2 miles per day. She doesn't bring on the chest pain with that she does get some shortness of breath. She denies any PND or orthopnea. She doesn't report palpitations, presyncope or syncope.   Past Medical History:  Diagnosis Date  . Arthritis   . Chronic back pain   . Colon polyps   . GERD (gastroesophageal reflux disease)   . Heart murmur   . Hypertension   . Malignant neoplasm of right female breast ([place])    unspecified site of breast  . 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 Prediabetes   . Shortness of breath     Past Surgical History:  Procedure Laterality Date  . BREAST LUMPECTOMY WITH NEEDLE LOCALIZATION Right 05/11/2013   Procedure: RIGHT BREAST NEEDLE LOCALIZATION  LUMPECTOMY;  Surgeon: Ashley Klein, MD;  Location: [place];   Service: General;  Laterality: Right;  . CHOLECYSTECTOMY    . COLONOSCOPY    . FOOT OSTEOTOMY     both  feet  . TONSILLECTOMY       Current Outpatient Prescriptions  Medication Sig Dispense Refill  . losartan (COZAAR) 50 MG tablet Take 1 tablet by mouth daily.  1  . Multiple Vitamin (MULTIVITAMIN) capsule Take 1 capsule by mouth daily.     No current facility-administered medications for this visit.     Allergies:   Aspirin and Penicillins    ROS:  Please see the history of present illness.   Otherwise, review of systems are positive for Depression and decreased sleep and headaches.   All other systems are reviewed and negative.    PHYSICAL EXAM: VS:  BP 138/78   Pulse 72   Ht 5' (1.524 m)   Wt 139 lb 9.6 oz (63.3 kg)   BMI 27.26 kg/m  , BMI Body mass index is 27.26 kg/m. GENERAL:  Well appearing HEENT:  Pupils equal round and reactive, fundi not visualized, oral mucosa unremarkable NECK:  No jugular venous distention, waveform within normal limits, carotid upstroke brisk and symmetric, transmitted murmur, no bruits, no thyromegaly LYMPHATICS:  No cervical, inguinal adenopathy LUNGS:  Clear to auscultation bilaterally BACK:  No  CVA tenderness CHEST:  Unremarkable HEART:  PMI not displaced or sustained,S1 and S2 within normal limits, no S3, no S4, no clicks, no rubs, 3 out of 6 apical systolic murmur radiating out the aortic Tract and mid peaking, no diastolic murmurs ABD:  Flat, positive bowel sounds normal in frequency in pitch, no bruits, no rebound, no guarding, no midline pulsatile mass, no hepatomegaly, no splenomegaly EXT:  2 plus pulses throughout, no edema, no cyanosis no clubbing SKIN:  No rashes no nodules NEURO:  Cranial nerves II through XII grossly intact, motor grossly intact throughout PSYCH:  Cognitively intact, oriented to person place and time    EKG:  EKG is ordered today. The ekg ordered today demonstrates sinus rhythm, rate 72, axis within normal  limits, intervals within normal limits, no acute ST-T wave changes.   Recent Labs: 04/10/2016: BUN 13; Creat 0.87; Potassium 4.3; Sodium 136    Lipid Panel No results found for: CHOL, TRIG, HDL, CHOLHDL, VLDL, LDLCALC, LDLDIRECT    Wt Readings from Last 3 Encounters:  01/17/17 139 lb 9.6 oz (63.3 kg)  05/14/16 138 lb 12.8 oz (63 kg)  03/05/16 140 lb 12.8 oz (63.9 kg)      Other studies Reviewed: Additional studies/ records that were reviewed today include: Office records, old echo. Review of the above records demonstrates:  Please see elsewhere in the note.     ASSESSMENT AND PLAN:  AS:  She will get a follow up echo.  Further evaluation will be based on this  HTN:  Her blood pressure is well controlled. She'll continue the meds as listed.  DYSPNEA:  I'll start the echocardiogram as above. Also check a BNP level. I'll have a Pratt threshold for POET (Plain Old Exercise Treadmill)  HYPERGLYCEMIA:  Her A1C was 6.3.  She will follow per Ashley Low, MD   DYSLIPIDEMIA:  Her LDL was 89.  She will continue with diet.     Current medicines are reviewed at length with the patient today.  The patient does not have concerns regarding medicines.  The following changes have been made:  no change  Labs/ tests ordered today include:   Orders Placed This Encounter  Procedures  . B Nat Peptide  . EKG 12-Lead  . ECHOCARDIOGRAM COMPLETE     Disposition:   FU with me after the above testing.     Signed, Ashley Breeding, MD  01/17/2017 1:13 PM    [name]

## 2017-01-17 ENCOUNTER — Encounter: Payer: Self-pay | Admitting: Cardiology

## 2017-01-17 ENCOUNTER — Ambulatory Visit (INDEPENDENT_AMBULATORY_CARE_PROVIDER_SITE_OTHER): Payer: BLUE CROSS/BLUE SHIELD | Admitting: Cardiology

## 2017-01-17 VITALS — BP 138/78 | HR 72 | Ht 60.0 in | Wt 139.6 lb

## 2017-01-17 DIAGNOSIS — E785 Hyperlipidemia, unspecified: Secondary | ICD-10-CM | POA: Diagnosis not present

## 2017-01-17 DIAGNOSIS — I158 Other secondary hypertension: Secondary | ICD-10-CM | POA: Diagnosis not present

## 2017-01-17 DIAGNOSIS — R0602 Shortness of breath: Secondary | ICD-10-CM

## 2017-01-17 DIAGNOSIS — R739 Hyperglycemia, unspecified: Secondary | ICD-10-CM | POA: Diagnosis not present

## 2017-01-17 DIAGNOSIS — I350 Nonrheumatic aortic (valve) stenosis: Secondary | ICD-10-CM | POA: Diagnosis not present

## 2017-01-17 NOTE — Patient Instructions (Signed)
Medication Instructions:  Continue current medications  Labwork: BNP Today  Testing/Procedures: Your physician has requested that you have an echocardiogram. Echocardiography is a painless test that uses sound waves to create images of your heart. It provides your doctor with information about the size and shape of your heart and how well your heart's chambers and valves are working. This procedure takes approximately one hour. There are no restrictions for this procedure.   Follow-Up: Your physician recommends that you schedule a follow-up appointment in: After Echo   Any Other Special Instructions Will Be Listed Below (If Applicable).   If you need a refill on your cardiac medications before your next appointment, please call your pharmacy.

## 2017-01-18 LAB — BRAIN NATRIURETIC PEPTIDE: BRAIN NATRIURETIC PEPTIDE: 18.7 pg/mL (ref <100)

## 2017-01-23 ENCOUNTER — Telehealth: Payer: Self-pay | Admitting: Cardiology

## 2017-01-23 NOTE — Telephone Encounter (Signed)
Pt has DPR on file to speak to husband regarding care. Returned call to husband (English speaking - denied need for interpreter). Informed results of recent BNP normal. He voiced understanding and thanks.

## 2017-01-23 NOTE — Telephone Encounter (Signed)
New message    Pt husband is returning call to nurse about labs.

## 2017-02-04 ENCOUNTER — Other Ambulatory Visit: Payer: Self-pay

## 2017-02-04 ENCOUNTER — Ambulatory Visit (HOSPITAL_COMMUNITY): Payer: BLUE CROSS/BLUE SHIELD | Attending: Cardiovascular Disease

## 2017-02-04 DIAGNOSIS — I350 Nonrheumatic aortic (valve) stenosis: Secondary | ICD-10-CM | POA: Diagnosis not present

## 2017-02-04 DIAGNOSIS — I340 Nonrheumatic mitral (valve) insufficiency: Secondary | ICD-10-CM | POA: Insufficient documentation

## 2017-02-04 DIAGNOSIS — I10 Essential (primary) hypertension: Secondary | ICD-10-CM | POA: Diagnosis not present

## 2017-02-04 DIAGNOSIS — E785 Hyperlipidemia, unspecified: Secondary | ICD-10-CM | POA: Insufficient documentation

## 2017-02-11 NOTE — Progress Notes (Signed)
Cardiology Office Note   Date:  02/12/2017   ID:  Ashley Pratt, DOB 1951-10-21, MRN [id number]  PCP:  Wenda Low, MD  Cardiologist:   Minus Breeding, MD  Referring:  Wenda Low, MD  Chief Complaint  Patient presents with  . Shortness of Breath      History of Present Illness: Ashley Pratt is a 66 y.o. female who presents for evaluation of chest pain and shortness of breath.  The patient was previously seen by Dr. Johnsie Cancel last year for dyspnea.  She has some evidence of diastolic dysfunction.  Seh had an echo in 2015.  She had some mild AS as well.    She was to have a follow up echo in 2017 but did not have this done.  She also had some chest pain and was to get a POET (Plain Old Exercise Treadmill) but she did not have this done either.  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  She continues to have some dyspnea with exertion but otherwise feels relatively well. I brought her back today to discuss the left-sided superior vena cava. She does walk 2 miles daily with get short of breath going up an incline this seems to be slowly progressive over time. She's not describing any chest pressure, neck or arm discomfort. She's not having any palpitations presyncope or syncope.   Past Medical History:  Diagnosis Date  . Arthritis   . Chronic back pain   . Colon polyps   . GERD (gastroesophageal reflux disease)   . Heart murmur   . Hypertension   . Malignant neoplasm of right female breast ([place])    unspecified site of breast  . 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 Prediabetes   . Shortness of breath     Past Surgical History:  Procedure Laterality Date  . BREAST LUMPECTOMY WITH NEEDLE LOCALIZATION Right 05/11/2013   Procedure: RIGHT BREAST NEEDLE LOCALIZATION  LUMPECTOMY;  Surgeon: Stark Klein, MD;  Location: [facility];  Service: General;  Laterality: Right;  .  CHOLECYSTECTOMY    . COLONOSCOPY    . FOOT OSTEOTOMY     both  feet  . TONSILLECTOMY       Current Outpatient Prescriptions  Medication Sig Dispense Refill  . losartan (COZAAR) 50 MG tablet Take 1 tablet by mouth daily.  1  . Multiple Vitamin (MULTIVITAMIN) capsule Take 1 capsule by mouth daily.     No current facility-administered medications for this visit.     Allergies:   Aspirin and Penicillins    ROS:  Please see the history of present illness.   Otherwise, review of systems are positive for none.   All other systems are reviewed and negative.    PHYSICAL EXAM: VS:  BP (!) 158/80   Pulse 68   Ht 5' (1.524 m)   Wt 139 lb 9.6 oz (63.3 kg)   BMI 27.26 kg/m  , BMI Body mass index is 27.26 kg/m. GENERAL:  Well appearing NECK:  No jugular venous distention, waveform within normal limits, carotid upstroke brisk and symmetric, transmitted murmur, no bruits, no thyromegaly LUNGS:  Clear to auscultation bilaterally BACK:  No CVA tenderness CHEST:  Unremarkable HEART:  PMI not displaced or sustained,S1 and S2 within normal limits, no S3, no S4, no clicks, no rubs, 3 out of 6 apical systolic murmur radiating out the  aortic tract and early peaking,  no diastolic murmurs ABD:  Flat, positive bowel sounds normal in frequency in pitch, no bruits, no rebound, no guarding, no midline pulsatile mass, no hepatomegaly, no splenomegaly EXT:  2 plus pulses throughout, no edema, no cyanosis no clubbing    EKG:  EKG is not ordered today.    Recent Labs: 04/10/2016: BUN 13; Creat 0.87; Potassium 4.3; Sodium 136 01/17/2017: Brain Natriuretic Peptide 18.7    Lipid Panel No results found for: CHOL, TRIG, HDL, CHOLHDL, VLDL, LDLCALC, LDLDIRECT    Wt Readings from Last 3 Encounters:  02/12/17 139 lb 9.6 oz (63.3 kg)  01/17/17 139 lb 9.6 oz (63.3 kg)  05/14/16 138 lb 12.8 oz (63 kg)      Other studies Reviewed: Additional studies/ records that were reviewed today include:  Echo Review of the above records demonstrates:  Please see elsewhere in the note.     ASSESSMENT AND PLAN:  PERSISTENT LEFT SIDED SVC:  I discussed this with the patient. This point I don't think further imaging or evaluation of this is indicated. I called HUSAIN,KARRAR, MD today as he can help with translation of these results which is an issue.  He will see her back in May in his office after the POET (Plain Old Exercise Treadmill)   AS:  Echo did not suggest an AS gradient.  But suggested a gradient that was possibly subvalvular.  She's having no symptoms related to this. It was not significant gradient. I plan on following this up with another echo in the future.  HTN:   Her blood pressure is slightly elevated but this is unusual.  No change in meds is indicated.  She can keep an eye on this.   DYSPNEA:  BNP was normal.  Echo was as above.  Also check a BNP level. I'll have a low threshold for POET (Plain Old Exercise Treadmill).  This is scheduled for 4/20.      Current medicines are reviewed at length with the patient today.  The patient does not have concerns regarding medicines.  The following changes have been made:  None  Labs/ tests ordered today include:     Orders Placed This Encounter  Procedures  . B Nat Peptide  . EXERCISE TOLERANCE TEST  . EKG 12-Lead  . ECHOCARDIOGRAM COMPLETE     Disposition:   FU with me in one year.     Signed, Minus Breeding, MD  02/12/2017 5:37 PM    [place]

## 2017-02-12 ENCOUNTER — Ambulatory Visit (INDEPENDENT_AMBULATORY_CARE_PROVIDER_SITE_OTHER): Payer: BLUE CROSS/BLUE SHIELD | Admitting: Cardiology

## 2017-02-12 ENCOUNTER — Encounter: Payer: Self-pay | Admitting: Cardiology

## 2017-02-12 VITALS — BP 158/80 | HR 68 | Ht 60.0 in | Wt 139.6 lb

## 2017-02-12 DIAGNOSIS — Q269 Congenital malformation of great vein, unspecified: Secondary | ICD-10-CM | POA: Diagnosis not present

## 2017-02-12 DIAGNOSIS — R011 Cardiac murmur, unspecified: Secondary | ICD-10-CM

## 2017-02-12 DIAGNOSIS — R0600 Dyspnea, unspecified: Secondary | ICD-10-CM

## 2017-02-12 DIAGNOSIS — R0609 Other forms of dyspnea: Secondary | ICD-10-CM | POA: Diagnosis not present

## 2017-02-12 NOTE — Patient Instructions (Addendum)
Medication Instructions:  Continue current medications  Labwork: BNP  Testing/Procedures: Your physician has requested that you have an exercise tolerance test. For further information please visit [email or web address]. Please also follow instruction sheet, as given.  Your physician has requested that you have an echocardiogram in 1 Year. Echocardiography is a painless test that uses sound waves to create images of your heart. It provides your doctor with information about the size and shape of your heart and how well your heart's chambers and valves are working. This procedure takes approximately one hour. There are no restrictions for this procedure.   Follow-Up: Your physician wants you to follow-up in: 1 Year. You will receive a reminder letter in the mail two months in advance. If you don't receive a letter, please call our office to schedule the follow-up appointment.   Any Other Special Instructions Will Be Listed Below (If Applicable).   If you need a refill on your cardiac medications before your next appointment, please call your pharmacy.

## 2017-02-13 ENCOUNTER — Telehealth (HOSPITAL_COMMUNITY): Payer: Self-pay

## 2017-02-13 NOTE — Telephone Encounter (Signed)
Encounter complete. 

## 2017-02-14 ENCOUNTER — Ambulatory Visit: Payer: BLUE CROSS/BLUE SHIELD | Admitting: Cardiovascular Disease

## 2017-02-15 ENCOUNTER — Ambulatory Visit (HOSPITAL_COMMUNITY)
Admission: RE | Admit: 2017-02-15 | Discharge: 2017-02-15 | Disposition: A | Discharge status: Home / Self Care | Payer: BLUE CROSS/BLUE SHIELD | Source: Ambulatory Visit | Attending: Cardiovascular Disease | Admitting: Cardiovascular Disease

## 2017-02-15 DIAGNOSIS — R0609 Other forms of dyspnea: Secondary | ICD-10-CM | POA: Diagnosis present

## 2017-02-15 DIAGNOSIS — R0600 Dyspnea, unspecified: Secondary | ICD-10-CM

## 2017-02-15 LAB — EXERCISE TOLERANCE TEST
CSEPED: 3 min
CSEPEW: 5.2 METS
CSEPHR: 87 %
Exercise duration (sec): 35 s
MPHR: 156 {beats}/min
Peak HR: 136 {beats}/min
RPE: 18
Rest HR: 62 {beats}/min

## 2017-02-25 ENCOUNTER — Ambulatory Visit: Payer: BLUE CROSS/BLUE SHIELD | Admitting: Cardiology

## 2017-03-04 ENCOUNTER — Other Ambulatory Visit: Payer: Self-pay | Admitting: Family Medicine

## 2017-03-04 ENCOUNTER — Other Ambulatory Visit: Payer: Self-pay | Admitting: Internal Medicine

## 2017-03-04 DIAGNOSIS — Z1231 Encounter for screening mammogram for malignant neoplasm of breast: Secondary | ICD-10-CM

## 2017-03-19 ENCOUNTER — Ambulatory Visit
Admission: RE | Admit: 2017-03-19 | Discharge: 2017-03-19 | Disposition: A | Discharge status: Home / Self Care | Payer: BLUE CROSS/BLUE SHIELD | Source: Ambulatory Visit | Attending: Family Medicine | Admitting: Family Medicine

## 2017-03-19 DIAGNOSIS — Z1231 Encounter for screening mammogram for malignant neoplasm of breast: Secondary | ICD-10-CM

## 2017-07-12 ENCOUNTER — Other Ambulatory Visit: Payer: Self-pay | Admitting: Internal Medicine

## 2017-07-12 DIAGNOSIS — R1310 Dysphagia, unspecified: Secondary | ICD-10-CM

## 2017-07-18 ENCOUNTER — Ambulatory Visit
Admission: RE | Admit: 2017-07-18 | Discharge: 2017-07-18 | Disposition: A | Discharge status: Home / Self Care | Payer: BLUE CROSS/BLUE SHIELD | Source: Ambulatory Visit | Attending: Internal Medicine | Admitting: Internal Medicine

## 2017-07-18 DIAGNOSIS — R1310 Dysphagia, unspecified: Secondary | ICD-10-CM

## 2017-10-17 ENCOUNTER — Ambulatory Visit
Admission: RE | Admit: 2017-10-17 | Discharge: 2017-10-17 | Disposition: A | Discharge status: Home / Self Care | Payer: BLUE CROSS/BLUE SHIELD | Source: Ambulatory Visit | Attending: Internal Medicine | Admitting: Internal Medicine

## 2017-10-17 ENCOUNTER — Other Ambulatory Visit: Payer: Self-pay | Admitting: Internal Medicine

## 2017-10-17 DIAGNOSIS — R059 Cough, unspecified: Secondary | ICD-10-CM

## 2017-10-17 DIAGNOSIS — R05 Cough: Secondary | ICD-10-CM

## 2017-11-27 DIAGNOSIS — J309 Allergic rhinitis, unspecified: Secondary | ICD-10-CM | POA: Diagnosis not present

## 2017-11-27 DIAGNOSIS — I10 Essential (primary) hypertension: Secondary | ICD-10-CM | POA: Diagnosis not present

## 2018-02-12 ENCOUNTER — Ambulatory Visit (HOSPITAL_COMMUNITY): Payer: Medicare HMO | Attending: Cardiology

## 2018-03-04 ENCOUNTER — Other Ambulatory Visit: Payer: Self-pay | Admitting: Internal Medicine

## 2018-03-04 ENCOUNTER — Ambulatory Visit
Admission: RE | Admit: 2018-03-04 | Discharge: 2018-03-04 | Disposition: A | Discharge status: Home / Self Care | Payer: Medicare HMO | Source: Ambulatory Visit | Attending: Internal Medicine | Admitting: Internal Medicine

## 2018-03-04 DIAGNOSIS — R7309 Other abnormal glucose: Secondary | ICD-10-CM | POA: Diagnosis not present

## 2018-03-04 DIAGNOSIS — M179 Osteoarthritis of knee, unspecified: Secondary | ICD-10-CM

## 2018-03-04 DIAGNOSIS — M5489 Other dorsalgia: Secondary | ICD-10-CM

## 2018-03-04 DIAGNOSIS — M1712 Unilateral primary osteoarthritis, left knee: Secondary | ICD-10-CM | POA: Diagnosis not present

## 2018-03-04 DIAGNOSIS — M549 Dorsalgia, unspecified: Secondary | ICD-10-CM | POA: Diagnosis not present

## 2018-03-04 DIAGNOSIS — M5136 Other intervertebral disc degeneration, lumbar region: Secondary | ICD-10-CM | POA: Diagnosis not present

## 2018-03-04 DIAGNOSIS — Z1239 Encounter for other screening for malignant neoplasm of breast: Secondary | ICD-10-CM | POA: Diagnosis not present

## 2018-03-04 DIAGNOSIS — Z13220 Encounter for screening for lipoid disorders: Secondary | ICD-10-CM | POA: Diagnosis not present

## 2018-03-04 DIAGNOSIS — Z1389 Encounter for screening for other disorder: Secondary | ICD-10-CM | POA: Diagnosis not present

## 2018-03-04 DIAGNOSIS — I10 Essential (primary) hypertension: Secondary | ICD-10-CM | POA: Diagnosis not present

## 2018-03-04 DIAGNOSIS — M1711 Unilateral primary osteoarthritis, right knee: Secondary | ICD-10-CM | POA: Diagnosis not present

## 2018-03-04 DIAGNOSIS — C50911 Malignant neoplasm of unspecified site of right female breast: Secondary | ICD-10-CM | POA: Diagnosis not present

## 2018-03-04 DIAGNOSIS — I519 Heart disease, unspecified: Secondary | ICD-10-CM | POA: Diagnosis not present

## 2018-03-04 DIAGNOSIS — Z1231 Encounter for screening mammogram for malignant neoplasm of breast: Secondary | ICD-10-CM

## 2018-03-04 DIAGNOSIS — Z0000 Encounter for general adult medical examination without abnormal findings: Secondary | ICD-10-CM | POA: Diagnosis not present

## 2018-03-11 DIAGNOSIS — R69 Illness, unspecified: Secondary | ICD-10-CM | POA: Diagnosis not present

## 2018-03-13 DIAGNOSIS — R69 Illness, unspecified: Secondary | ICD-10-CM | POA: Diagnosis not present

## 2018-03-20 ENCOUNTER — Ambulatory Visit
Admission: RE | Admit: 2018-03-20 | Discharge: 2018-03-20 | Disposition: A | Discharge status: Home / Self Care | Payer: Medicare HMO | Source: Ambulatory Visit | Attending: Internal Medicine | Admitting: Internal Medicine

## 2018-03-20 DIAGNOSIS — Z1231 Encounter for screening mammogram for malignant neoplasm of breast: Secondary | ICD-10-CM

## 2018-07-01 DIAGNOSIS — H524 Presbyopia: Secondary | ICD-10-CM | POA: Diagnosis not present

## 2018-07-01 DIAGNOSIS — Z0100 Encounter for examination of eyes and vision without abnormal findings: Secondary | ICD-10-CM | POA: Diagnosis not present

## 2018-07-22 ENCOUNTER — Ambulatory Visit
Admission: RE | Admit: 2018-07-22 | Discharge: 2018-07-22 | Disposition: A | Discharge status: Home / Self Care | Payer: Medicare HMO | Source: Ambulatory Visit | Attending: Internal Medicine | Admitting: Internal Medicine

## 2018-07-22 ENCOUNTER — Other Ambulatory Visit: Payer: Self-pay | Admitting: Internal Medicine

## 2018-07-22 DIAGNOSIS — K219 Gastro-esophageal reflux disease without esophagitis: Secondary | ICD-10-CM | POA: Diagnosis not present

## 2018-07-22 DIAGNOSIS — M79672 Pain in left foot: Secondary | ICD-10-CM

## 2018-07-22 DIAGNOSIS — R1013 Epigastric pain: Secondary | ICD-10-CM

## 2018-07-25 ENCOUNTER — Ambulatory Visit
Admission: RE | Admit: 2018-07-25 | Discharge: 2018-07-25 | Disposition: A | Discharge status: Home / Self Care | Payer: Medicare HMO | Source: Ambulatory Visit | Attending: Internal Medicine | Admitting: Internal Medicine

## 2018-07-25 DIAGNOSIS — K219 Gastro-esophageal reflux disease without esophagitis: Secondary | ICD-10-CM | POA: Diagnosis not present

## 2018-07-25 DIAGNOSIS — R1013 Epigastric pain: Secondary | ICD-10-CM

## 2018-09-01 DIAGNOSIS — Z23 Encounter for immunization: Secondary | ICD-10-CM | POA: Diagnosis not present

## 2018-09-01 DIAGNOSIS — C50911 Malignant neoplasm of unspecified site of right female breast: Secondary | ICD-10-CM | POA: Diagnosis not present

## 2018-09-01 DIAGNOSIS — I10 Essential (primary) hypertension: Secondary | ICD-10-CM | POA: Diagnosis not present

## 2018-09-01 DIAGNOSIS — J309 Allergic rhinitis, unspecified: Secondary | ICD-10-CM | POA: Diagnosis not present

## 2018-09-01 DIAGNOSIS — M79672 Pain in left foot: Secondary | ICD-10-CM | POA: Diagnosis not present

## 2018-09-01 DIAGNOSIS — R7303 Prediabetes: Secondary | ICD-10-CM | POA: Diagnosis not present

## 2018-09-01 DIAGNOSIS — K219 Gastro-esophageal reflux disease without esophagitis: Secondary | ICD-10-CM | POA: Diagnosis not present

## 2018-09-04 ENCOUNTER — Encounter: Payer: Self-pay | Admitting: Podiatry

## 2018-09-04 ENCOUNTER — Ambulatory Visit: Payer: Medicare HMO | Admitting: Podiatry

## 2018-09-04 ENCOUNTER — Ambulatory Visit: Payer: Self-pay

## 2018-09-04 VITALS — BP 168/94 | HR 63

## 2018-09-04 DIAGNOSIS — M778 Other enthesopathies, not elsewhere classified: Secondary | ICD-10-CM

## 2018-09-04 DIAGNOSIS — M779 Enthesopathy, unspecified: Principal | ICD-10-CM

## 2018-09-04 DIAGNOSIS — M25572 Pain in left ankle and joints of left foot: Secondary | ICD-10-CM | POA: Diagnosis not present

## 2018-09-04 DIAGNOSIS — M19072 Primary osteoarthritis, left ankle and foot: Secondary | ICD-10-CM

## 2018-09-04 NOTE — Progress Notes (Signed)
  Subjective:  Patient ID: Ashley Pratt, female    DOB: December 17, 1950,  MRN: [id number]  Chief Complaint  Patient presents with  . Foot Pain    left foot pain - referred by [facility]    67 y.o. female presents with the above complaint. Reports pain in the top of the left foot for 6 months espeically when walking. Had XR by her PCP   Review of Systems: Negative except as noted in the HPI. Denies N/V/F/Ch.  Past Medical History:  Diagnosis Date  . Arthritis   . Chronic back pain   . Colon polyps   . GERD (gastroesophageal reflux disease)   . Heart murmur   . Hypertension   . Malignant neoplasm of right female breast ([place])    unspecified site of breast  . 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 Prediabetes   . Shortness of breath     Current Outpatient Medications:  .  losartan (COZAAR) 50 MG tablet, Take 1 tablet by mouth daily., Disp: , Rfl: 1 .  Multiple Vitamin (MULTIVITAMIN) capsule, Take 1 capsule by mouth daily., Disp: , Rfl:  .  RABEprazole (ACIPHEX) 20 MG tablet, , Disp: , Rfl:   Social History   Tobacco Use  Smoking Status Never Smoker  Smokeless Tobacco Never Used    Allergies  Allergen Reactions  . Aspirin     Gi upset  . Penicillins     Not sure   Objective:   Vitals:   09/04/18 1032  BP: (!) 168/94  Pulse: 63   There is no height or weight on file to calculate BMI. Constitutional Well developed. Well nourished.  Vascular Dorsalis pedis pulses palpable bilaterally. Posterior tibial pulses palpable bilaterally. Capillary refill normal to all digits.  No cyanosis or clubbing noted. Pedal hair growth normal.  Neurologic Normal speech. Oriented to person, place, and time. Epicritic sensation to light touch grossly present bilaterally.  Dermatologic Nails well groomed and normal in appearance. No open wounds. No skin lesions.  Orthopedic: Normal joint ROM without pain or crepitus bilaterally. No visible deformities. POP L STJ,  POP dorsal L Midfoot   Radiographs: Prior XR reviewed midfoot arthrosis no other degenerative changes. Assessment:   1. 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 Pain in joint of left foot    Plan:  Patient was evaluated and treated and all questions answered.  Midfoot arthritis, Sinus Tarsitis -XR reviewed. -Offered injection. Declined. -Offered meloxicam. Declined. -Recommend natural anti-inflammatory use including Turmeric. -Patient to return if pain persists.  No follow-ups on file.

## 2018-09-16 DIAGNOSIS — R69 Illness, unspecified: Secondary | ICD-10-CM | POA: Diagnosis not present

## 2019-01-07 DIAGNOSIS — Z0100 Encounter for examination of eyes and vision without abnormal findings: Secondary | ICD-10-CM | POA: Diagnosis not present

## 2019-02-16 DIAGNOSIS — R195 Other fecal abnormalities: Secondary | ICD-10-CM | POA: Diagnosis not present

## 2019-02-16 DIAGNOSIS — C50911 Malignant neoplasm of unspecified site of right female breast: Secondary | ICD-10-CM | POA: Diagnosis not present

## 2019-02-17 ENCOUNTER — Other Ambulatory Visit: Payer: Self-pay | Admitting: Internal Medicine

## 2019-02-17 DIAGNOSIS — Z1231 Encounter for screening mammogram for malignant neoplasm of breast: Secondary | ICD-10-CM

## 2019-03-05 DIAGNOSIS — K1379 Other lesions of oral mucosa: Secondary | ICD-10-CM | POA: Diagnosis not present

## 2019-03-24 ENCOUNTER — Other Ambulatory Visit: Payer: Self-pay | Admitting: Internal Medicine

## 2019-03-24 ENCOUNTER — Ambulatory Visit
Admission: RE | Admit: 2019-03-24 | Discharge: 2019-03-24 | Disposition: A | Discharge status: Home / Self Care | Payer: Medicare HMO | Source: Ambulatory Visit | Attending: Internal Medicine | Admitting: Internal Medicine

## 2019-03-24 DIAGNOSIS — M545 Low back pain: Secondary | ICD-10-CM | POA: Diagnosis not present

## 2019-03-24 DIAGNOSIS — M549 Dorsalgia, unspecified: Secondary | ICD-10-CM

## 2019-03-24 DIAGNOSIS — W1809XA Striking against other object with subsequent fall, initial encounter: Secondary | ICD-10-CM | POA: Diagnosis not present

## 2019-03-24 DIAGNOSIS — R0781 Pleurodynia: Secondary | ICD-10-CM | POA: Diagnosis not present

## 2019-03-26 ENCOUNTER — Ambulatory Visit
Admission: RE | Admit: 2019-03-26 | Discharge: 2019-03-26 | Disposition: A | Discharge status: Home / Self Care | Payer: Medicare HMO | Source: Ambulatory Visit | Attending: Internal Medicine | Admitting: Internal Medicine

## 2019-03-26 ENCOUNTER — Other Ambulatory Visit: Payer: Self-pay | Admitting: Internal Medicine

## 2019-03-26 DIAGNOSIS — R079 Chest pain, unspecified: Secondary | ICD-10-CM | POA: Diagnosis not present

## 2019-03-26 DIAGNOSIS — R0781 Pleurodynia: Secondary | ICD-10-CM

## 2019-03-30 DIAGNOSIS — K121 Other forms of stomatitis: Secondary | ICD-10-CM | POA: Diagnosis not present

## 2019-03-30 DIAGNOSIS — L438 Other lichen planus: Secondary | ICD-10-CM | POA: Diagnosis not present

## 2019-04-10 DIAGNOSIS — K0510 Chronic gingivitis, plaque induced: Secondary | ICD-10-CM | POA: Diagnosis not present

## 2019-04-16 ENCOUNTER — Other Ambulatory Visit: Payer: Self-pay

## 2019-04-16 ENCOUNTER — Ambulatory Visit
Admission: RE | Admit: 2019-04-16 | Discharge: 2019-04-16 | Disposition: A | Discharge status: Home / Self Care | Payer: Medicare HMO | Source: Ambulatory Visit | Attending: Internal Medicine | Admitting: Internal Medicine

## 2019-04-16 DIAGNOSIS — Z1231 Encounter for screening mammogram for malignant neoplasm of breast: Secondary | ICD-10-CM

## 2019-04-20 DIAGNOSIS — L438 Other lichen planus: Secondary | ICD-10-CM | POA: Diagnosis not present

## 2019-04-20 DIAGNOSIS — K121 Other forms of stomatitis: Secondary | ICD-10-CM | POA: Diagnosis not present

## 2019-04-29 ENCOUNTER — Telehealth: Payer: Self-pay | Admitting: *Deleted

## 2019-04-29 NOTE — Telephone Encounter (Signed)
A message was left, re: follow up visit. 

## 2019-06-11 DIAGNOSIS — Z23 Encounter for immunization: Secondary | ICD-10-CM | POA: Diagnosis not present

## 2019-06-11 DIAGNOSIS — K219 Gastro-esophageal reflux disease without esophagitis: Secondary | ICD-10-CM | POA: Diagnosis not present

## 2019-06-11 DIAGNOSIS — M79672 Pain in left foot: Secondary | ICD-10-CM | POA: Diagnosis not present

## 2019-06-11 DIAGNOSIS — R7303 Prediabetes: Secondary | ICD-10-CM | POA: Diagnosis not present

## 2019-06-11 DIAGNOSIS — J309 Allergic rhinitis, unspecified: Secondary | ICD-10-CM | POA: Diagnosis not present

## 2019-06-11 DIAGNOSIS — C50911 Malignant neoplasm of unspecified site of right female breast: Secondary | ICD-10-CM | POA: Diagnosis not present

## 2019-06-11 DIAGNOSIS — Z1389 Encounter for screening for other disorder: Secondary | ICD-10-CM | POA: Diagnosis not present

## 2019-06-11 DIAGNOSIS — Z0000 Encounter for general adult medical examination without abnormal findings: Secondary | ICD-10-CM | POA: Diagnosis not present

## 2019-06-11 DIAGNOSIS — I10 Essential (primary) hypertension: Secondary | ICD-10-CM | POA: Diagnosis not present

## 2019-07-01 DIAGNOSIS — R69 Illness, unspecified: Secondary | ICD-10-CM | POA: Diagnosis not present

## 2019-08-08 DIAGNOSIS — R69 Illness, unspecified: Secondary | ICD-10-CM | POA: Diagnosis not present

## 2019-09-01 DIAGNOSIS — Z0100 Encounter for examination of eyes and vision without abnormal findings: Secondary | ICD-10-CM | POA: Diagnosis not present

## 2019-10-30 HISTORY — PX: BREAST LUMPECTOMY: SHX2

## 2019-12-30 ENCOUNTER — Ambulatory Visit
Admission: RE | Admit: 2019-12-30 | Discharge: 2019-12-30 | Disposition: A | Discharge status: Home / Self Care | Payer: Medicare Other | Source: Ambulatory Visit | Attending: Internal Medicine | Admitting: Internal Medicine

## 2019-12-30 ENCOUNTER — Other Ambulatory Visit: Payer: Self-pay | Admitting: Internal Medicine

## 2019-12-30 DIAGNOSIS — M179 Osteoarthritis of knee, unspecified: Secondary | ICD-10-CM

## 2020-03-11 ENCOUNTER — Other Ambulatory Visit: Payer: Self-pay | Admitting: Internal Medicine

## 2020-03-11 DIAGNOSIS — Z1231 Encounter for screening mammogram for malignant neoplasm of breast: Secondary | ICD-10-CM

## 2020-04-19 ENCOUNTER — Ambulatory Visit
Admission: RE | Admit: 2020-04-19 | Discharge: 2020-04-19 | Disposition: A | Discharge status: Home / Self Care | Payer: Medicare Other | Source: Ambulatory Visit | Attending: Internal Medicine | Admitting: Internal Medicine

## 2020-04-19 ENCOUNTER — Other Ambulatory Visit: Payer: Self-pay

## 2020-04-19 DIAGNOSIS — Z1231 Encounter for screening mammogram for malignant neoplasm of breast: Secondary | ICD-10-CM

## 2020-04-22 ENCOUNTER — Other Ambulatory Visit: Payer: Self-pay | Admitting: Internal Medicine

## 2020-04-22 DIAGNOSIS — R928 Other abnormal and inconclusive findings on diagnostic imaging of breast: Secondary | ICD-10-CM

## 2020-05-09 ENCOUNTER — Ambulatory Visit
Admission: RE | Admit: 2020-05-09 | Discharge: 2020-05-09 | Disposition: A | Discharge status: Home / Self Care | Payer: Medicare Other | Source: Ambulatory Visit | Attending: Internal Medicine | Admitting: Internal Medicine

## 2020-05-09 ENCOUNTER — Other Ambulatory Visit: Payer: Self-pay

## 2020-05-09 ENCOUNTER — Other Ambulatory Visit: Payer: Self-pay | Admitting: Internal Medicine

## 2020-05-09 DIAGNOSIS — R928 Other abnormal and inconclusive findings on diagnostic imaging of breast: Secondary | ICD-10-CM

## 2020-05-09 DIAGNOSIS — R921 Mammographic calcification found on diagnostic imaging of breast: Secondary | ICD-10-CM

## 2020-05-10 ENCOUNTER — Ambulatory Visit
Admission: RE | Admit: 2020-05-10 | Discharge: 2020-05-10 | Disposition: A | Discharge status: Home / Self Care | Payer: Medicare Other | Source: Ambulatory Visit | Attending: Internal Medicine | Admitting: Internal Medicine

## 2020-05-10 ENCOUNTER — Other Ambulatory Visit: Payer: Self-pay

## 2020-05-10 DIAGNOSIS — R921 Mammographic calcification found on diagnostic imaging of breast: Secondary | ICD-10-CM

## 2020-05-13 ENCOUNTER — Ambulatory Visit
Admission: RE | Admit: 2020-05-13 | Discharge: 2020-05-13 | Disposition: A | Discharge status: Home / Self Care | Payer: Medicare Other | Source: Ambulatory Visit | Attending: Internal Medicine | Admitting: Internal Medicine

## 2020-05-13 ENCOUNTER — Other Ambulatory Visit: Payer: Self-pay

## 2020-06-17 ENCOUNTER — Telehealth: Payer: Self-pay | Admitting: Hematology

## 2020-06-17 NOTE — Progress Notes (Signed)
[place]   [phone] Fax:([phone] [place] Note   Patient Care Team: Wenda Low, MD as PCP - General (Internal Medicine)  Date of Service:  06/20/2020   CHIEF COMPLAINTS/PURPOSE OF CONSULTATION:  Recurrent right breast LCIS   REFERRING PHYSICIAN:  Dr Deforest Hoyles, PCP  Oncology History Overview Note  Cancer Staging Breast neoplasm LCIS (right) Staging form: Breast, AJCC 7th Edition - Clinical: Stage Unknown (Tis (LCIS), NX, cM0) - Signed by Gordy Levan, MD on 07/12/2014 - Pathologic: No stage assigned - Unsigned    Neoplasm of right breast, 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 This measures 2.1 x 0.7 x 2.2 cm.  At the  distal end of this hypoechoic area, there is another hypoechoic  area that may contain calcifications, 6 cm from the right nipple at  11 o'clock measuring 9 x 4 x 6 mm.  Findings are concerning for  possible carcinoma.    03/10/2013 Initial Biopsy   Diagnosis Breast, right, needle core biopsy, 9:30, 8cm/nipple - LOBULAR CARCINOMA IN SITU WITH NECROSIS AND CALCIFICATION, SEE COMMENT. Microscopic Comment There is extensive lobular carcinoma in situ, pleomorphic type, present with associated necrosis and calcification. The presence of the myoepithelial layer was confirmed with p63, smooth muscle mycin heavy chain, and calponin immunostains. The case was reviewed with Dr. Gari Crown who concurs. (CRR:caf 03/12/13)   05/11/2013 Surgery   RIGHT BREAST NEEDLE LOCALIZATION  LUMPECTOMY by Dr Barry Dienes    05/11/2013 Pathology Results   Diagnosis Breast, lumpectomy, Right - LOBULAR CARCINOMA IN SITU WITH CALCIFICATIONS, PARTIALLY INVOLVING AN INTRADUCTAL PAPILLOMA. - FIBROCYSTIC CHANGES WITH CALCIFICATIONS. -  HEALING BIOPSY SITE. - SEE COMMENT. Microscopic Comment Immunohistochemical stains for smooth muscle myosin, calponin, p63 and cytokeratin AE1/AE3 fail to highlight the presence of invasive carcinoma. A cytokeratin 5/6 stain is negative for the presence of atypical ductal hyperplasia. The surgical resection margin(s) of the specimen were inked and microscopically evaluated. (JBK:caf 05/14/13)   07/18/2013 Imaging   DEXA  Osteopenia with Lowest T-score -1.7 at [facility]    06/2013 - 03/2016 Anti-estrogen oral therapy   She tried Tamoxifen and Exemestane. She was Intolerant to tamoxifen initially. Cumulative stiffness/ arthralgias from aromasin already resolved. She stopped on her own.    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 This patient is considered osteopenic according to [facility]) criteria. There has been a statistically significant increase in BMD of Lumbar spine and no statistically significant change in left hip since prior exam dated 07/28/2013.   05/09/2020 Mammogram   There are developing grouped calcifications in the anterior third of the upper inner quadrant of the right breast spanning an area of 5 mm. They are indeterminate.    05/13/2020 Relapse/Recurrence   Diagnosis Breast, right, needle core biopsy, UIQ - MAMMARY CARCINOMA IN-SITU WITH NECROSIS AND CALCIFICATIONS - SEE COMMENT Microscopic Comment There is a focus concerning but not definitive for microinvasion. Based on the biopsy, the carcinoma in situ has a pleomorphic pattern, high nuclear grade and measures 0.4 cm in greatest linear extent. E-cadherin  is pending and will be reported in an addendum. Dr. Jeannie Done reviewed the case and agrees with the above diagnosis. These results  were called to [facility] on May 16, 2020.      HISTORY OF PRESENTING ILLNESS:  Ashley Pratt 69 y.o. female is a here because of recurrent right breast LCIS. The patient was referred by her PCP Dr Deforest Hoyles. The patient presents to the clinic today accompanied by her husband.  She wanted to be seen by Medical Oncologist before consulting with a surgeon. Her first LCIS diagnosed in 2014 was found by screening mammogram and she felt the mass mildly herself. She had Lumpectomy with Dr Barry Dienes. She took Tamoxifen and Exemestane over 3 years. She did not tolerate well due to hypertension which requested more medication to control, but she did not want to take so she stopped AI. She did have arthritis so that was the likely cause of her joint pain. Due to calcifications on her 04/19/20 mammogram she had biopsy which showed recurrence with LCIS of right breast. She only notes occasional pain at right breast from her right lumpectomy. She notes her cousin is a retired Futures trader and they would like me to talk to her.   Socially she is married. She does not drink alcohol and non smoker. She is retired and has 2 adult children. She has a PMHx of HTN, on Losartan. She has heartburn, but does not require medication. She has cholecystectomy and prior right lumpectomy, foot surgery and tonsillectomy. She denies family history of cancer, but has family history of HTN and Stroke in her immediate family.    GYN HISTORY  Menarchal: age [age] LMP: age 45 HRT: No G4P2 with 2 miscarriages, first at age 57, she breastfed    REVIEW OF SYSTEMS:    Constitutional: Denies fevers, chills or abnormal night sweats Eyes: Denies blurriness of vision, double vision or watery eyes Ears, nose, mouth, throat, and face: Denies mucositis or sore throat Respiratory: Denies cough, dyspnea or wheezes Cardiovascular: Denies palpitation, chest discomfort or lower extremity  swelling Gastrointestinal:  Denies nausea, heartburn or change in bowel habits Skin: Denies abnormal skin rashes Lymphatics: Denies new lymphadenopathy or easy bruising Neurological:Denies numbness, tingling or new weaknesses Behavioral/Psych: Mood is stable, no new changes  All other systems were reviewed with the patient and are negative.   MEDICAL HISTORY:  Past Medical History:  Diagnosis Date  . Arthritis   . Chronic back pain   . Colon polyps   . GERD (gastroesophageal reflux disease)   . Heart murmur   . Hypertension   . Malignant neoplasm of right female breast ([place])    unspecified site of breast  . 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 Prediabetes   . Shortness of breath     SURGICAL HISTORY: Past Surgical History:  Procedure Laterality Date  . BREAST EXCISIONAL BIOPSY Right 2014  . BREAST LUMPECTOMY Right 05/11/2013   high risk lumpectomy  . BREAST LUMPECTOMY WITH NEEDLE LOCALIZATION Right 05/11/2013   Procedure: RIGHT BREAST NEEDLE LOCALIZATION  LUMPECTOMY;  Surgeon: Stark Klein, MD;  Location: [place];  Service: General;  Laterality: Right;  . CHOLECYSTECTOMY    . COLONOSCOPY    . FOOT OSTEOTOMY     both  feet  . TONSILLECTOMY      SOCIAL HISTORY: Social History   Socioeconomic History  . Marital status: Married    Spouse name: Not on  file  . Number of children: 2  . Years of education: Not on file  . Highest education level: Not on file  Occupational History  . Not on file  Tobacco Use  . Smoking status: Never Smoker  . Smokeless tobacco: Never Used  Substance and Sexual Activity  . Alcohol use: No  . Drug use: No  . Sexual activity: Yes  Other Topics Concern  . Not on file  Social History Narrative  . Not on file   Social Determinants of Health   Financial Resource Strain:   . Difficulty of Paying Living Expenses: Not on file  Food Insecurity:   . Worried About Charity fundraiser in the Last Year:  Not on file  . Ran Out of Food in the Last Year: Not on file  Transportation Needs:   . Lack of Transportation (Medical): Not on file  . Lack of Transportation (Non-Medical): Not on file  Physical Activity:   . Days of Exercise per Week: Not on file  . Minutes of Exercise per Session: Not on file  Stress:   . Feeling of Stress : Not on file  Social Connections:   . Frequency of Communication with Friends and Family: Not on file  . Frequency of Social Gatherings with Friends and Family: Not on file  . Attends Religious Services: Not on file  . Active Member of Clubs or Organizations: Not on file  . Attends Archivist Meetings: Not on file  . Marital Status: Not on file  Intimate Partner Violence:   . Fear of Current or Ex-Partner: Not on file  . Emotionally Abused: Not on file  . Physically Abused: Not on file  . Sexually Abused: Not on file    FAMILY HISTORY: Family History  Problem Relation Age of Onset  . Hypertension Mother   . Hypertension Sister   . Hypertension Brother     ALLERGIES:  is allergic to aspirin and penicillins.  MEDICATIONS:  Current Outpatient Medications  Medication Sig Dispense Refill  . losartan (COZAAR) 50 MG tablet Take 1 tablet by mouth daily.  1  . Multiple Vitamin (MULTIVITAMIN) capsule Take 1 capsule by mouth daily.    . RABEprazole (ACIPHEX) 20 MG tablet      No current facility-administered medications for this visit.    PHYSICAL EXAMINATION: ECOG PERFORMANCE STATUS: 0 - Asymptomatic  Vitals:   06/20/20 1516 06/20/20 1519  BP: (!) 188/83 (!) 175/86  Pulse: 70   Resp: 18   Temp: 98.3 F (36.8 C)   SpO2: 97%    Filed Weights   06/20/20 1516  Weight: 141 lb 4.8 oz (64.1 kg)    GENERAL:alert, no distress and comfortable SKIN: skin color, texture, turgor are normal, no rashes or significant lesions EYES: normal, Conjunctiva are pink and non-injected, sclera clear  NECK: supple, thyroid normal size, non-tender, without  nodularity LYMPH:  no palpable lymphadenopathy in the cervical, axillary  LUNGS: clear to auscultation and percussion with normal breathing effort HEART: regular rate & rhythm and no lower extremity edema (+) Strong heart murmur  ABDOMEN:abdomen soft, non-tender and normal bowel sounds Musculoskeletal:no cyanosis of digits and no clubbing  NEURO: alert & oriented x 3 with fluent speech, no focal motor/sensory deficits BREAST: S/p right lumpectomy: Surgical incision at 9:00 position healed well (+) Light skin color around her right nipple s/p surgery. (+) 1x2cm Mild lump under right breast at 2:00 position. Left Breast exam benign.  LABORATORY DATA:  I have reviewed  the data as listed CBC Latest Ref Rng & Units 10/17/2015 03/07/2015 07/12/2014  WBC 3.9 - 10.3 10e3/uL 9.0 8.4 9.0  Hemoglobin 11.6 - 15.9 g/dL 12.8 12.5 12.8  Hematocrit 34 - 46 % 40.8 38.2 40.0  Platelets 145 - 400 10e3/uL 212 200 227    CMP Latest Ref Rng & Units 04/10/2016 10/17/2015 03/07/2015  Glucose 65 - 99 mg/dL 88 120 100  BUN 7 - 25 mg/dL 13 14.5 14.7  Creatinine 0.50 - 0.99 mg/dL 0.87 0.9 0.9  Sodium 135 - 146 mmol/L 136 139 138  Potassium 3.5 - 5.3 mmol/L 4.3 4.1 4.3  Chloride 98 - 110 mmol/L 98 - -  CO2 20 - 31 mmol/L $RemoveB'26 28 25  'bKttMXHN$ Calcium 8.6 - 10.4 mg/dL 9.5 10.1 9.8  Total Protein 6.4 - 8.3 g/dL - 7.5 6.7  Total Bilirubin 0.20 - 1.20 mg/dL - 0.33 0.56  Alkaline Phos 40 - 150 U/L - 103 56  AST 5 - 34 U/L - 24 20  ALT 0 - 55 U/L - 32 20     RADIOGRAPHIC STUDIES: I have personally reviewed the radiological images as listed and agreed with the findings in the report. No results found.  ASSESSMENT & PLAN:  Indica Marcott is a 69 y.o. female with a history of Arthritis, GERD, HTN.   1. Right breast LCIS, High grade with necrosis, pleomorphic, recurrence in 04/2020  -She was initially diagnosed with LCIS in 02/2013. She was treated with right lumpectomy with Dr Barry Dienes and about 3 years of antiestrogen  therapy, which she stopped on her own due to poor tolerance.  -On 04/19/20 screening mammogram she was found to have calcification in her right breast. Further workup in 04/2020 showed 55mm of LCIS in her UIQ of right breast. I discussed her biopsy results which showed high-grade LCIS with necrosis and calcification, with multifocal foci concerning for invasive cancer but not definitive. I discussed that biopsy may have sampling limitation, and invasive cancer if not ruled out.  -I recommend MRI breast to further evaluate her breast and axilla. She does have C density breast tissue. She may need further biopsy based on MRI.  -She is a candidate for breast conservation surgery. I will refer her back to surgeon Dr Barry Dienes to discuss surgery, which I encourage her to proceed with. Given concern for invasive disease, I also recommend Sentinel LN biopsy with surgery for further evaluation.  -Her LCIS will be cured by complete surgical resection. Any form of adjuvant therapy is preventive.  -Given her strongly positive ER and PR and high risk for future breast cancer, I do recommend antiestrogen therapy, which decrease her risk of future breast cancer by ~40%.  -If she has invasive carcinoma on her surgical sample, she would also benefit from adjuvant radiation. -We also discussed the breast cancer surveillance after her surgery. She will continue annual screening mammogram, self exams, and a routine office visit with lab and exam with [place]. Given her dense breast tissue, I discussed the option of additional screening with annual breast MRIs. I also discussed Abbreviated MRIs which have $400 out-of-pocket cost if insurance does not cover this. She is interested. -Her initial breast exam today shows mild hematoma at biopsy site. No other true mass.  -F/u after surgery  -She has received both her COVID19 vaccines.    2. HTN, GERD, Arthritis, Herat murmur  -Only on Losartan and controlled.  -She notes BP is usually  normal but elevated today at 175/86 (06/20/20).  -  She will continue to f/u with PCP and cardiologist    PLAN:  -Breast MRI in 1-2 weeks  -Consult with Dr Barry Dienes on 8/30. I will copy my note to her. I encouraged her to have surgery  -I will call her cousin Dr. Lowry Ram who is a retired Futures trader.  -f/u after surgery   Orders Placed This Encounter  Procedures  . MR BREAST BILATERAL W WO CONTRAST INC CAD    Epic ORDER PF: 04/19/2020 @ [facility] - BRCA RIGHT BREAST NO CYCLE    Standing Status:   Future    Standing Expiration Date:   06/20/2021    Order Specific Question:   If indicated for the ordered procedure, I authorize the administration of contrast media per Radiology protocol    Answer:   Yes    Order Specific Question:   What is the patient's sedation requirement?    Answer:   No Sedation    Order Specific Question:   Does the patient have a pacemaker or implanted devices?    Answer:   No    Order Specific Question:   Radiology Contrast Protocol - do NOT remove file path    Answer:   [email or web address]    Order Specific Question:   Preferred imaging location?    Answer:   [facility] (table limit-550lbs)    All questions were answered. The patient knows to call the clinic with any problems, questions or concerns. The total time spent in the appointment was 40 minutes.     Truitt Merle, MD 06/20/2020 4:02 PM  I, Joslyn Devon, am acting as scribe for Truitt Merle, MD.   I have reviewed the above documentation for accuracy and completeness, and I agree with the above.

## 2020-06-17 NOTE — Telephone Encounter (Signed)
Received a new pt referral from Dr. Lysle Rubens for recurrent breast cancer. Ashley Pratt wanted to see an oncologist before seeing a Psychologist, sport and exercise. Pt has been scheduled to see Dr. Burr Medico on 8/23 at 3pm. Appt date and time has been given to the pt's spouse. Aware to arrive 20 minutes early.

## 2020-06-20 ENCOUNTER — Inpatient Hospital Stay: Payer: Medicare Other | Attending: Hematology | Admitting: Hematology

## 2020-06-20 ENCOUNTER — Encounter: Payer: Self-pay | Admitting: Hematology

## 2020-06-20 ENCOUNTER — Other Ambulatory Visit: Payer: Self-pay

## 2020-06-20 DIAGNOSIS — I10 Essential (primary) hypertension: Secondary | ICD-10-CM | POA: Diagnosis not present

## 2020-06-20 DIAGNOSIS — K219 Gastro-esophageal reflux disease without esophagitis: Secondary | ICD-10-CM | POA: Diagnosis not present

## 2020-06-20 DIAGNOSIS — D0501 Lobular carcinoma in situ of right breast: Secondary | ICD-10-CM | POA: Diagnosis present

## 2020-06-20 DIAGNOSIS — Z170 Estrogen receptor positive status [ER+]: Secondary | ICD-10-CM | POA: Diagnosis not present

## 2020-06-20 DIAGNOSIS — R011 Cardiac murmur, unspecified: Secondary | ICD-10-CM | POA: Insufficient documentation

## 2020-06-21 ENCOUNTER — Encounter: Payer: Self-pay | Admitting: Hematology

## 2020-06-22 ENCOUNTER — Telehealth: Payer: Self-pay | Admitting: Hematology

## 2020-06-22 NOTE — Telephone Encounter (Signed)
No 8/23 los

## 2020-06-23 ENCOUNTER — Telehealth: Payer: Self-pay | Admitting: Hematology

## 2020-06-23 NOTE — Telephone Encounter (Signed)
Per pt's request, I called her cousin Dr. Skeet Simmer at [phone], and left her a message for her to call me back.   Truitt Merle  06/23/2020

## 2020-07-06 ENCOUNTER — Ambulatory Visit
Admission: RE | Admit: 2020-07-06 | Discharge: 2020-07-06 | Disposition: A | Discharge status: Home / Self Care | Payer: Medicare Other | Source: Ambulatory Visit | Attending: Hematology | Admitting: Hematology

## 2020-07-06 ENCOUNTER — Other Ambulatory Visit: Payer: Self-pay

## 2020-07-06 DIAGNOSIS — D0501 Lobular carcinoma in situ of right breast: Secondary | ICD-10-CM

## 2020-07-06 MED ORDER — GADOBUTROL 1 MMOL/ML IV SOLN
6.0000 mL | Freq: Once | INTRAVENOUS | Status: AC | PRN
  Administered 2020-07-06: 6 mL via INTRAVENOUS

## 2020-07-08 ENCOUNTER — Other Ambulatory Visit: Payer: Self-pay | Admitting: General Surgery

## 2020-07-08 ENCOUNTER — Telehealth: Payer: Self-pay

## 2020-07-08 DIAGNOSIS — R928 Other abnormal and inconclusive findings on diagnostic imaging of breast: Secondary | ICD-10-CM

## 2020-07-12 ENCOUNTER — Telehealth: Payer: Self-pay

## 2020-07-12 NOTE — Telephone Encounter (Signed)
Mr Wyrick called wanting to know if Dr Ernestina Penna plan has changed secondary to Mrs Rydberg's MRI results. Forwarded to Dr. Burr Medico.

## 2020-07-12 NOTE — Telephone Encounter (Signed)
error 

## 2020-07-13 NOTE — Telephone Encounter (Signed)
I spoke with Ashley Pratt, per Dr. Burr Medico she has reviewed her recent MRI results and there is no change in her treatment plan.

## 2020-07-14 ENCOUNTER — Other Ambulatory Visit: Payer: Self-pay | Admitting: General Surgery

## 2020-07-14 DIAGNOSIS — R928 Other abnormal and inconclusive findings on diagnostic imaging of breast: Secondary | ICD-10-CM

## 2020-07-16 IMAGING — CR DG FOOT 2V*L*
2 series · 2 of 2 positions shown · non-contrast
Comparison: None.

CLINICAL DATA: Left foot pain for several months without known
injury.

EXAM:
LEFT FOOT - 2 VIEW

[t foot ap left]
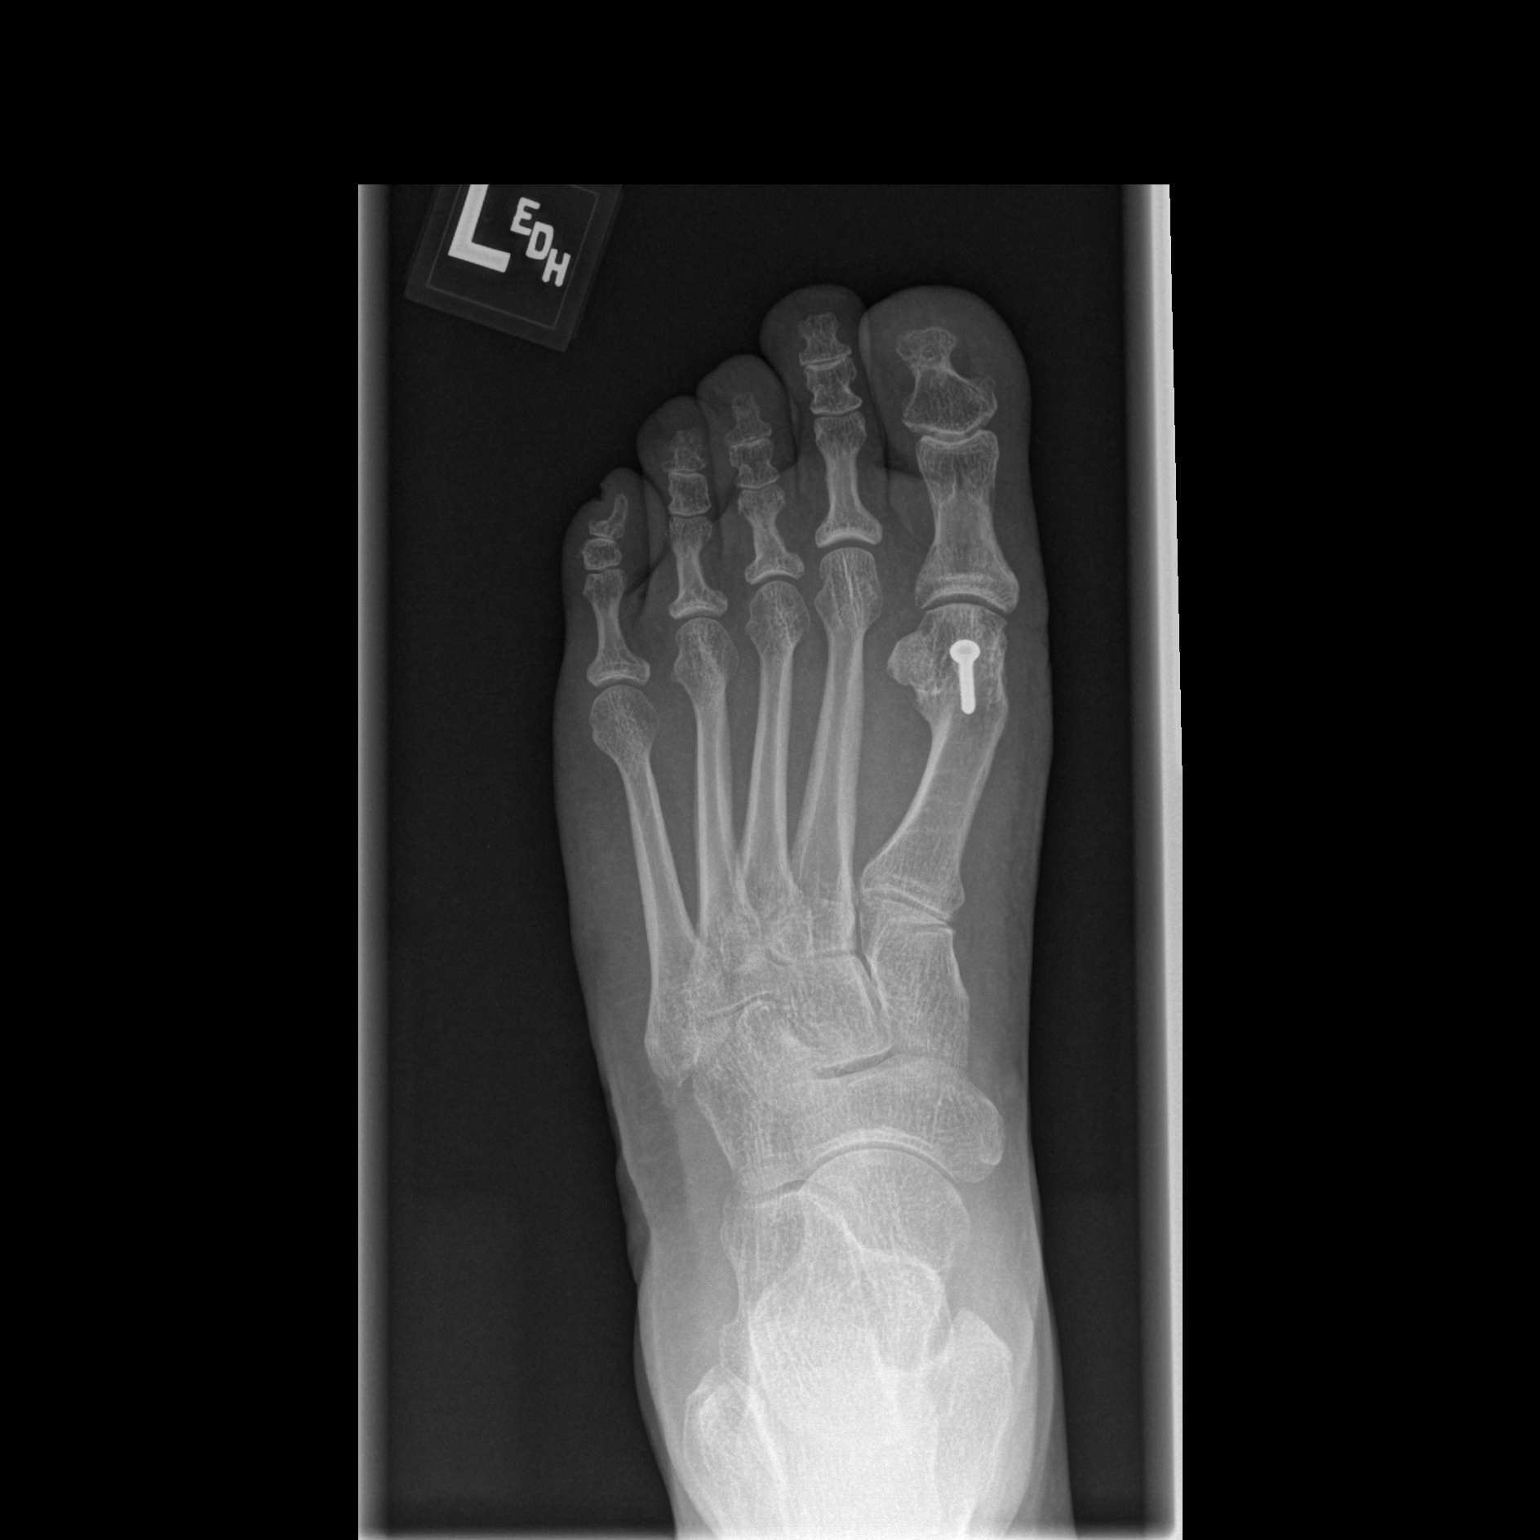

[t foot lat left]
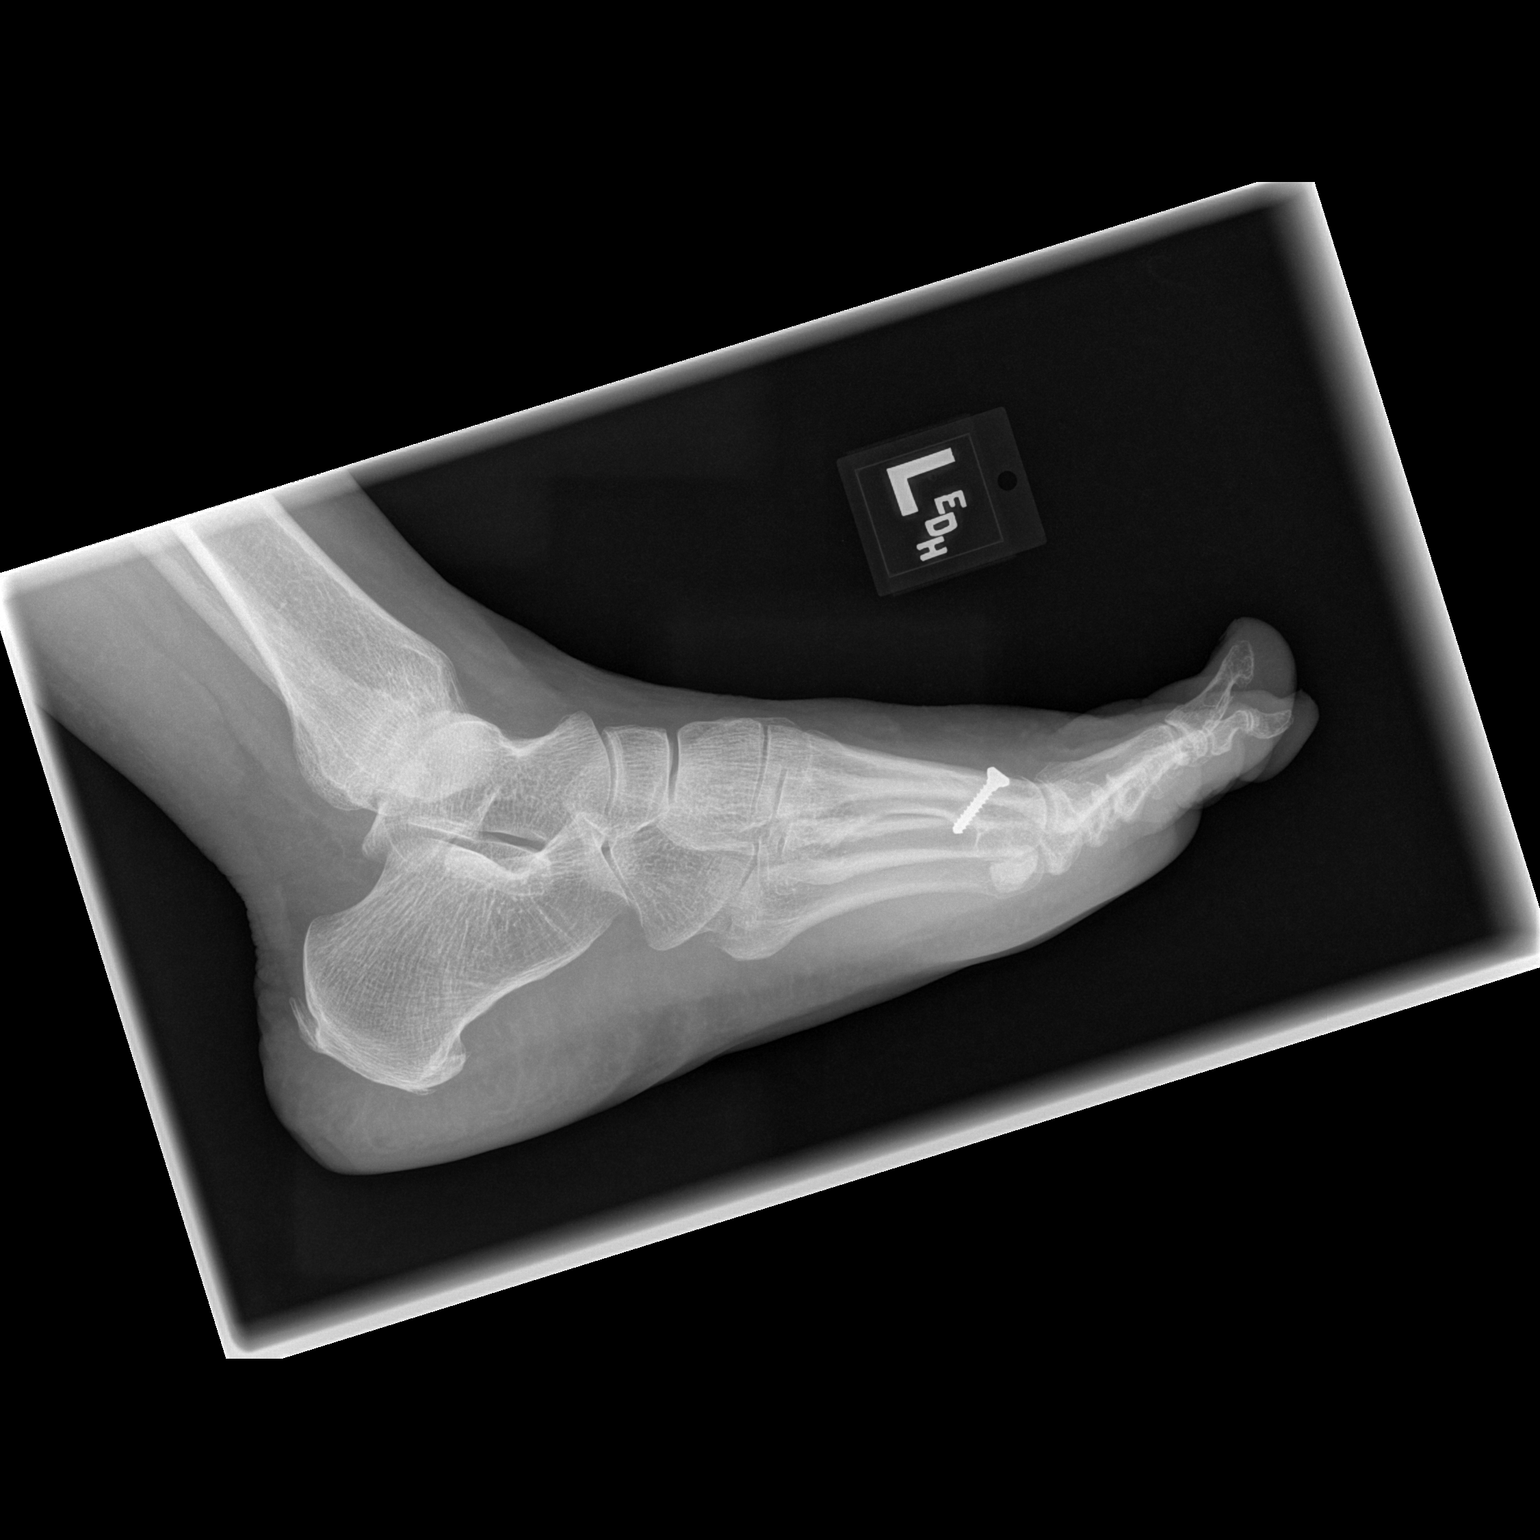

[2 of 2 positions shown; findings below may reference images not displayed]

FINDINGS: There is no evidence of fracture or dislocation. Mild posterior
calcaneal spurring is noted. Postsurgical changes are noted in
distal first metatarsal. Soft tissues are unremarkable.
IMPRESSION: No acute abnormality seen in the left foot.

## 2020-07-18 ENCOUNTER — Telehealth: Payer: Self-pay | Admitting: Hematology

## 2020-07-18 NOTE — Telephone Encounter (Signed)
Scheduled appointment per 9/17 scheduling message. Spoke with patient's husband who is aware of upcoming appointment.

## 2020-07-26 ENCOUNTER — Other Ambulatory Visit: Payer: Self-pay

## 2020-07-26 ENCOUNTER — Encounter (HOSPITAL_BASED_OUTPATIENT_CLINIC_OR_DEPARTMENT_OTHER): Payer: Self-pay | Admitting: General Surgery

## 2020-07-28 ENCOUNTER — Encounter (HOSPITAL_BASED_OUTPATIENT_CLINIC_OR_DEPARTMENT_OTHER)
Admission: RE | Admit: 2020-07-28 | Discharge: 2020-07-28 | Disposition: A | Discharge status: Home / Self Care | Payer: Medicare Other | Source: Ambulatory Visit | Attending: General Surgery | Admitting: General Surgery

## 2020-07-28 ENCOUNTER — Other Ambulatory Visit: Payer: Self-pay

## 2020-07-28 DIAGNOSIS — Z01810 Encounter for preprocedural cardiovascular examination: Secondary | ICD-10-CM | POA: Diagnosis present

## 2020-07-28 MED ORDER — CHLORHEXIDINE GLUCONATE CLOTH 2 % EX PADS: 6.0000 | MEDICATED_PAD | Freq: Once | CUTANEOUS | Status: DC

## 2020-07-28 NOTE — Progress Notes (Signed)

## 2020-07-29 ENCOUNTER — Other Ambulatory Visit (HOSPITAL_COMMUNITY)
Admission: RE | Admit: 2020-07-29 | Discharge: 2020-07-29 | Disposition: A | Discharge status: Home / Self Care | Payer: Medicare Other | Source: Ambulatory Visit | Attending: General Surgery | Admitting: General Surgery

## 2020-07-29 DIAGNOSIS — Z20822 Contact with and (suspected) exposure to covid-19: Secondary | ICD-10-CM | POA: Diagnosis not present

## 2020-07-29 DIAGNOSIS — Z01812 Encounter for preprocedural laboratory examination: Secondary | ICD-10-CM | POA: Diagnosis present

## 2020-07-29 LAB — SARS CORONAVIRUS 2 (TAT 6-24 HRS): SARS Coronavirus 2: NEGATIVE

## 2020-08-01 ENCOUNTER — Other Ambulatory Visit: Payer: Self-pay

## 2020-08-01 ENCOUNTER — Ambulatory Visit
Admission: RE | Admit: 2020-08-01 | Discharge: 2020-08-01 | Disposition: A | Discharge status: Home / Self Care | Payer: Medicare Other | Source: Ambulatory Visit | Attending: General Surgery | Admitting: General Surgery

## 2020-08-01 DIAGNOSIS — R928 Other abnormal and inconclusive findings on diagnostic imaging of breast: Secondary | ICD-10-CM

## 2020-08-01 NOTE — H&P (Signed)
Ashley Pratt Appointment: 07/08/2020 9:45 AM Location: [facility] Patient #: [id number] DOB: 1951/01/10 Married / Language: Undefined / Race: Refused to Report/Unreported Female   History of Present Illness Stark Klein MD; 07/08/2020 10:39 AM) The patient is a 69 year old female who presents with a complaint of Breast problems. Pt is a lovely 60 yo F who I met in 2014 for a palpable right breast mass. 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 She tried tamoxifen and exemestane. She also tried arimidex. She eventually stopped this. She had a recent mammogram that had 5 mm of abnormal calcifications in the upper inner quadrant of the right breast. Core needle biopsy of this area was also LCIS. She is referred for excision. She has no new health problems. She subsequently had MRI that was negative for other concerning findings.    Right dx mammogram 05/09/2020 EXAM: DIGITAL DIAGNOSTIC RIGHT MAMMOGRAM WITH CAD  COMPARISON: Previous exam(s).  ACR Breast Density Category c: The breast tissue is heterogeneously dense, which may obscure small masses.  FINDINGS: Additional imaging of the right breast was performed. There are developing grouped calcifications in the anterior third of the upper inner quadrant of the right breast spanning an area of 5 mm. They are indeterminate. There is no suspicious mass.  Mammographic images were processed with CAD.  IMPRESSION: Indeterminate calcifications in the right breast.  RECOMMENDATION: Stereotactic biopsy of calcifications in the right breast is recommended.  I have discussed the findings and recommendations with the patient. If applicable, a reminder letter will be sent to the patient regarding the next appointment.  BI-RADS CATEGORY 4: Suspicious.    MR breast bilateral 07/06/2020  IMPRESSION: 1. No convincing breast malignancy. 2. 1 cm area of low level enhancement in the anterior, medial  right breast corresponds to a prior benign biopsy from 2014. 3. There is a focus of enhancement, which is similar to other areas of background enhancement, in the right breast adjacent to the ribbon shaped biopsy clip from the recent biopsied LCIS. 4. Benign post lumpectomy changes on the right.  RECOMMENDATION: 1. Treatment as planned for the recently diagnosed right breast LCIS.  BI-RADS CATEGORY 2: Benign.   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 Based on the biopsy, the carcinoma in situ has a pleomorphic pattern, high nuclear grade and measures 0.4 cm in greatest linear extent E-cadherin is NEGATIVE supporting lobular origin. Given the lobular morphology, cytokeratin AE1/3 was performed on all biopsies blocks to highlight isolated tumor cells. There are multiple foci concerning for invasive carcinoma.   Past Surgical History Antonietta Jewel, [place]; 07/08/2020 10:03 AM) Breast Biopsy  Right. multiple Gallbladder Surgery - Open  Tonsillectomy   Diagnostic Studies History (Chanel Teressa Senter, CMA; 07/08/2020 10:03 AM) Colonoscopy  5-10 years ago Mammogram  within last year Pap Smear  1-5 years ago  Allergies ([place], CMA; 07/08/2020 10:04 AM) No Known Drug Allergies  [07/08/2020]: Allergies Reconciled   Medication History (Chanel Teressa Senter, CMA; 07/08/2020 10:04 AM) Losartan Potassium (50MG  Tablet, Oral) Active. Medications Reconciled  Social History Antonietta Jewel, CMA; 07/08/2020 10:03 AM) Tobacco use  Never smoker.  Family History ([place], [place]; 07/08/2020 10:03 AM) Hypertension  Father.  Pregnancy / Birth History Antonietta Jewel, [place]; 07/08/2020 10:03 AM) Age at menarche  68 years. Gravida  4 Length (months) of breastfeeding  12-24 Maternal age   50-25 Para  2 Regular periods   Other Problems (Chanel Teressa Senter, CMA; 07/08/2020 10:03 AM) Arthritis  Back Pain  Heart murmur  High blood pressure     Review of Systems (Chanel Nolan CMA; 07/08/2020 10:03 AM) General Not Present- Appetite Loss, Chills, Fatigue, Fever, Night Sweats, Weight Gain and Weight Loss. Skin Not Present- Change in Wart/Mole, Dryness, Hives, Jaundice, New Lesions, Non-Healing Wounds, Rash and Ulcer. HEENT Not Present- Earache, Hearing Loss, Hoarseness, Nose Bleed, Oral Ulcers, Ringing in the Ears, Seasonal Allergies, Sinus Pain, Sore Throat, Visual Disturbances, Wears glasses/contact lenses and Yellow Eyes. Respiratory Not Present- Bloody sputum, Chronic Cough, Difficulty Breathing, Snoring and Wheezing. Breast Not Present- Breast Mass, Breast Pain, Nipple Discharge and Skin Changes. Cardiovascular Not Present- Chest Pain, Difficulty Breathing Lying Down, Leg Cramps, Palpitations, Rapid Heart Rate, Shortness of Breath and Swelling of Extremities. Gastrointestinal Not Present- Abdominal Pain, Bloating, Bloody Stool, Change in Bowel Habits, Chronic diarrhea, Constipation, Difficulty Swallowing, Excessive gas, Gets full quickly at meals, Hemorrhoids, Indigestion, Nausea, Rectal Pain and Vomiting. Female Genitourinary Not Present- Frequency, Nocturia, Painful Urination, Pelvic Pain and Urgency. Musculoskeletal Not Present- Back Pain, Joint Pain, Joint Stiffness, Muscle Pain, Muscle Weakness and Swelling of Extremities. Neurological Not Present- Decreased Memory, Fainting, Headaches, Numbness, Seizures, Tingling, Tremor, Trouble walking and Weakness. Psychiatric Present- Change in Sleep Pattern. Not Present- Anxiety, Bipolar, Depression, Fearful and Frequent crying. Endocrine Not Present- Cold Intolerance, Excessive Hunger, Hair Changes, Heat Intolerance, Hot flashes and New Diabetes. Hematology Not Present- Blood Thinners, Easy Bruising, Excessive bleeding, Gland  problems, HIV and Persistent Infections.  Vitals (Chanel Nolan CMA; 07/08/2020 10:04 AM) 07/08/2020 10:04 AM Weight: 141.38 lb Height: 60in Body Surface Area: 1.61 m Body Mass Index: 27.61 kg/m  Temp.: 97.40F  Pulse: 79 (Regular)        Physical Exam Stark Klein MD; 07/08/2020 10:40 AM) General Mental Status-Alert. General Appearance-Consistent with stated age. Hydration-Well hydrated. Voice-Normal.  Head and Neck Head-normocephalic, atraumatic with no lesions or palpable masses. Trachea-midline. Thyroid Gland Characteristics - normal size and consistency.  Eye Eyeball - Bilateral-Extraocular movements intact. Sclera/Conjunctiva - Bilateral-No scleral icterus.  Chest and Lung Exam Chest and lung exam reveals -quiet, even and easy respiratory effort with no use of accessory muscles and on auscultation, normal breath sounds, no adventitious sounds and normal vocal resonance. Inspection Chest Wall - Normal. Back - normal.  Breast Note: right breast slightly smaller than left. lateral scar well healed. no palpable mass either breast. no LAD. no nipple retraction or skin dimpling. right biopsy site is close to the nipple border.   Cardiovascular Cardiovascular examination reveals -normal heart sounds, regular rate and rhythm with no murmurs and normal pedal pulses bilaterally.  Abdomen Inspection Inspection of the abdomen reveals - No Hernias. Palpation/Percussion Palpation and Percussion of the abdomen reveal - Soft, Non Tender, No Rebound tenderness, No Rigidity (guarding) and No hepatosplenomegaly. Auscultation Auscultation of the abdomen reveals - Bowel sounds normal.  Neurologic Neurologic evaluation reveals -alert and oriented x 3 with no impairment of recent or remote memory. Mental Status-Normal.  Musculoskeletal Global Assessment -Note: no gross deformities.  Normal Exam - Left-Upper Extremity Strength Normal and  Lower Extremity Strength Normal. Normal Exam - Right-Upper Extremity Strength Normal and Lower Extremity Strength Normal.  Lymphatic Head & Neck  General Head & Neck Lymphatics: Bilateral - Description - Normal. Axillary  General Axillary Region: Bilateral - Description - Normal. Tenderness - Non Tender. Femoral & Inguinal  Generalized Femoral & Inguinal Lymphatics: Bilateral -  Description - No Generalized lymphadenopathy.    Assessment & Plan Stark Klein MD; 07/08/2020 10:41 AM) ABNORMAL MAMMOGRAM OF RIGHT BREAST (R92.8) Impression: Will plan excision to evaluate for invasive cancer.  The surgical procedure was described to the patient. I discussed the incision type and location and that we would need radiology involved on with a wire or seed marker and/or sentinel node.  The risks and benefits of the procedure were described to the patient and she wishes to proceed.  We discussed the risks bleeding, infection, damage to other structures, need for further procedures/surgeries. We discussed the risk of seroma. The patient was advised if the area in the breast in cancer, we may need to go back to surgery for additional tissue to obtain negative margins or for a lymph node biopsy. The patient was advised that these are the most common complications, but that others can occur as well. They were advised against taking aspirin or other anti-inflammatory agents/blood thinners the week before surgery. Current Plans You are being scheduled for surgery- Our schedulers will call you.  You should hear from our office's scheduling department within 5 working days about the location, date, and time of surgery. We try to make accommodations for patient's preferences in scheduling surgery, but sometimes the OR schedule or the surgeon's schedule prevents [place] from making those accommodations.  If you have not heard from our office [phone]) in 5 working days, call the office and ask for your  surgeon's nurse.  If you have other questions about your diagnosis, plan, or surgery, call the office and ask for your surgeon's nurse.  Pt Education - [facility] Breast Biopsy HCI: discussed with patient and provided information.

## 2020-08-02 ENCOUNTER — Ambulatory Visit (HOSPITAL_BASED_OUTPATIENT_CLINIC_OR_DEPARTMENT_OTHER): Payer: Medicare Other | Admitting: Anesthesiology

## 2020-08-02 ENCOUNTER — Other Ambulatory Visit: Payer: Self-pay

## 2020-08-02 ENCOUNTER — Ambulatory Visit
Admission: RE | Admit: 2020-08-02 | Discharge: 2020-08-02 | Disposition: A | Discharge status: Home / Self Care | Payer: Medicare Other | Source: Ambulatory Visit | Attending: General Surgery | Admitting: General Surgery

## 2020-08-02 ENCOUNTER — Encounter (HOSPITAL_BASED_OUTPATIENT_CLINIC_OR_DEPARTMENT_OTHER): Payer: Self-pay | Admitting: General Surgery

## 2020-08-02 ENCOUNTER — Encounter (HOSPITAL_BASED_OUTPATIENT_CLINIC_OR_DEPARTMENT_OTHER)
Admission: RE | Disposition: A | Discharge status: Home / Self Care | Payer: Self-pay | Source: Home / Self Care | Attending: General Surgery

## 2020-08-02 ENCOUNTER — Ambulatory Visit (HOSPITAL_BASED_OUTPATIENT_CLINIC_OR_DEPARTMENT_OTHER)
Admission: RE | Admit: 2020-08-02 | Discharge: 2020-08-02 | Disposition: A | Discharge status: Home / Self Care | Payer: Medicare Other | Attending: General Surgery | Admitting: General Surgery

## 2020-08-02 DIAGNOSIS — R928 Other abnormal and inconclusive findings on diagnostic imaging of breast: Secondary | ICD-10-CM | POA: Diagnosis not present

## 2020-08-02 DIAGNOSIS — M1990 Unspecified osteoarthritis, unspecified site: Secondary | ICD-10-CM | POA: Diagnosis not present

## 2020-08-02 DIAGNOSIS — D0501 Lobular carcinoma in situ of right breast: Secondary | ICD-10-CM | POA: Diagnosis not present

## 2020-08-02 DIAGNOSIS — N62 Hypertrophy of breast: Secondary | ICD-10-CM | POA: Insufficient documentation

## 2020-08-02 DIAGNOSIS — R030 Elevated blood-pressure reading, without diagnosis of hypertension: Secondary | ICD-10-CM | POA: Insufficient documentation

## 2020-08-02 HISTORY — PX: BREAST LUMPECTOMY WITH RADIOACTIVE SEED LOCALIZATION: SHX6424

## 2020-08-02 SURGERY — BREAST LUMPECTOMY WITH RADIOACTIVE SEED LOCALIZATION
Anesthesia: General | Site: Breast | Laterality: Right

## 2020-08-02 MED ORDER — LIDOCAINE HCL (CARDIAC) PF 100 MG/5ML IV SOSY
PREFILLED_SYRINGE | INTRAVENOUS | Status: DC | PRN
  Administered 2020-08-02: 60 mg via INTRAVENOUS

## 2020-08-02 MED ORDER — PROPOFOL 10 MG/ML IV BOLUS
INTRAVENOUS | Status: DC | PRN
  Administered 2020-08-02: 150 mg via INTRAVENOUS

## 2020-08-02 MED ORDER — EPHEDRINE SULFATE 50 MG/ML IJ SOLN
INTRAMUSCULAR | Status: DC | PRN
  Administered 2020-08-02: 10 mg via INTRAVENOUS

## 2020-08-02 MED ORDER — FENTANYL CITRATE (PF) 100 MCG/2ML IJ SOLN
INTRAMUSCULAR | Status: DC | PRN
Start: 2020-08-02 — End: 2020-08-02
  Administered 2020-08-02: 50 ug via INTRAVENOUS
  Administered 2020-08-02 (×2): 25 ug via INTRAVENOUS

## 2020-08-02 MED ORDER — BUPIVACAINE-EPINEPHRINE (PF) 0.25% -1:200000 IJ SOLN
INTRAMUSCULAR | Status: DC | PRN
  Administered 2020-08-02: 30 mL via PERINEURAL

## 2020-08-02 MED ORDER — OXYCODONE HCL 5 MG/5ML PO SOLN: 5.0000 mg | Freq: Once | ORAL | Status: DC | PRN

## 2020-08-02 MED ORDER — CIPROFLOXACIN IN D5W 400 MG/200ML IV SOLN
INTRAVENOUS | Status: AC
  Filled 2020-08-02: qty 200

## 2020-08-02 MED ORDER — DEXAMETHASONE SODIUM PHOSPHATE 4 MG/ML IJ SOLN
INTRAMUSCULAR | Status: DC | PRN
  Administered 2020-08-02: 10 mg via INTRAVENOUS

## 2020-08-02 MED ORDER — FENTANYL CITRATE (PF) 100 MCG/2ML IJ SOLN
INTRAMUSCULAR | Status: AC
  Filled 2020-08-02: qty 2

## 2020-08-02 MED ORDER — ACETAMINOPHEN 500 MG PO TABS: 1000.0000 mg | ORAL_TABLET | ORAL | Status: DC

## 2020-08-02 MED ORDER — OXYCODONE HCL 5 MG PO TABS: 5.0000 mg | ORAL_TABLET | Freq: Once | ORAL | Status: DC | PRN

## 2020-08-02 MED ORDER — ONDANSETRON HCL 4 MG/2ML IJ SOLN
INTRAMUSCULAR | Status: DC | PRN
  Administered 2020-08-02: 4 mg via INTRAVENOUS

## 2020-08-02 MED ORDER — DIPHENHYDRAMINE HCL 50 MG/ML IJ SOLN
INTRAMUSCULAR | Status: DC | PRN
  Administered 2020-08-02: 6.25 mg via INTRAVENOUS

## 2020-08-02 MED ORDER — FENTANYL CITRATE (PF) 100 MCG/2ML IJ SOLN: 25.0000 ug | INTRAMUSCULAR | Status: DC | PRN

## 2020-08-02 MED ORDER — LACTATED RINGERS IV SOLN: INTRAVENOUS | Status: DC

## 2020-08-02 MED ORDER — CIPROFLOXACIN IN D5W 400 MG/200ML IV SOLN
400.0000 mg | INTRAVENOUS | Status: AC
  Administered 2020-08-02: 400 mg via INTRAVENOUS

## 2020-08-02 MED ORDER — MIDAZOLAM HCL 5 MG/5ML IJ SOLN
INTRAMUSCULAR | Status: DC | PRN
  Administered 2020-08-02: 1 mg via INTRAVENOUS

## 2020-08-02 MED ORDER — MIDAZOLAM HCL 2 MG/2ML IJ SOLN
INTRAMUSCULAR | Status: AC
  Filled 2020-08-02: qty 2

## 2020-08-02 MED ORDER — ONDANSETRON HCL 4 MG/2ML IJ SOLN: 4.0000 mg | Freq: Four times a day (QID) | INTRAMUSCULAR | Status: DC | PRN

## 2020-08-02 MED ORDER — OXYCODONE HCL 5 MG PO TABS: 5.0000 mg | ORAL_TABLET | Freq: Four times a day (QID) | ORAL | 0 refills | Status: DC | PRN

## 2020-08-02 SURGICAL SUPPLY — 55 items
ADH SKN CLS APL DERMABOND .7 (GAUZE/BANDAGES/DRESSINGS) ×1
APL PRP STRL LF DISP 70% ISPRP (MISCELLANEOUS) ×1
BINDER BREAST LRG (GAUZE/BANDAGES/DRESSINGS) IMPLANT
BINDER BREAST MEDIUM (GAUZE/BANDAGES/DRESSINGS) IMPLANT
BINDER BREAST XLRG (GAUZE/BANDAGES/DRESSINGS) IMPLANT
BINDER BREAST XXLRG (GAUZE/BANDAGES/DRESSINGS) IMPLANT
BLADE SURG 10 STRL SS (BLADE) ×2 IMPLANT
BLADE SURG 15 STRL LF DISP TIS (BLADE) IMPLANT
BLADE SURG 15 STRL SS (BLADE)
CANISTER SUC SOCK COL 7IN (MISCELLANEOUS) IMPLANT
CANISTER SUCT 1200ML W/VALVE (MISCELLANEOUS) IMPLANT
CHLORAPREP W/TINT 26 (MISCELLANEOUS) ×2 IMPLANT
CLIP VESOCCLUDE LG 6/CT (CLIP) ×2 IMPLANT
CLIP VESOCCLUDE MED 6/CT (CLIP) IMPLANT
COVER BACK TABLE 60X90IN (DRAPES) ×2 IMPLANT
COVER MAYO STAND STRL (DRAPES) ×2 IMPLANT
COVER PROBE W GEL 5X96 (DRAPES) ×2 IMPLANT
COVER WAND RF STERILE (DRAPES) IMPLANT
DECANTER SPIKE VIAL GLASS SM (MISCELLANEOUS) IMPLANT
DERMABOND ADVANCED (GAUZE/BANDAGES/DRESSINGS) ×1
DERMABOND ADVANCED .7 DNX12 (GAUZE/BANDAGES/DRESSINGS) ×1 IMPLANT
DRAPE LAPAROSCOPIC ABDOMINAL (DRAPES) ×2 IMPLANT
DRAPE UTILITY XL STRL (DRAPES) ×2 IMPLANT
ELECT COATED BLADE 2.86 ST (ELECTRODE) ×2 IMPLANT
ELECT REM PT RETURN 9FT ADLT (ELECTROSURGICAL) ×2
ELECTRODE REM PT RTRN 9FT ADLT (ELECTROSURGICAL) ×1 IMPLANT
GAUZE SPONGE 4X4 12PLY STRL LF (GAUZE/BANDAGES/DRESSINGS) ×1 IMPLANT
GLOVE BIO SURGEON STRL SZ 6 (GLOVE) ×2 IMPLANT
GLOVE BIOGEL PI IND STRL 6.5 (GLOVE) ×1 IMPLANT
GLOVE BIOGEL PI INDICATOR 6.5 (GLOVE) ×1
GOWN STRL REUS W/ TWL LRG LVL3 (GOWN DISPOSABLE) ×1 IMPLANT
GOWN STRL REUS W/TWL 2XL LVL3 (GOWN DISPOSABLE) ×2 IMPLANT
GOWN STRL REUS W/TWL LRG LVL3 (GOWN DISPOSABLE) ×4
KIT MARKER MARGIN INK (KITS) ×2 IMPLANT
LIGHT WAVEGUIDE WIDE FLAT (MISCELLANEOUS) ×1 IMPLANT
NDL HYPO 25X1 1.5 SAFETY (NEEDLE) ×1 IMPLANT
NEEDLE HYPO 25X1 1.5 SAFETY (NEEDLE) ×2 IMPLANT
NS IRRIG 1000ML POUR BTL (IV SOLUTION) ×2 IMPLANT
PACK BASIN DAY SURGERY FS (CUSTOM PROCEDURE TRAY) ×2 IMPLANT
PENCIL SMOKE EVACUATOR (MISCELLANEOUS) ×2 IMPLANT
SLEEVE SCD COMPRESS KNEE MED (MISCELLANEOUS) ×2 IMPLANT
SPONGE LAP 18X18 RF (DISPOSABLE) ×3 IMPLANT
STRIP CLOSURE SKIN 1/2X4 (GAUZE/BANDAGES/DRESSINGS) ×2 IMPLANT
SUT MNCRL AB 4-0 PS2 18 (SUTURE) ×2 IMPLANT
SUT SILK 2 0 SH (SUTURE) IMPLANT
SUT VIC AB 2-0 SH 27 (SUTURE) ×2
SUT VIC AB 2-0 SH 27XBRD (SUTURE) ×1 IMPLANT
SUT VIC AB 3-0 SH 27 (SUTURE) ×2
SUT VIC AB 3-0 SH 27X BRD (SUTURE) ×1 IMPLANT
SYR BULB EAR ULCER 2OZ BL STRL (SYRINGE) ×1 IMPLANT
SYR CONTROL 10ML LL (SYRINGE) ×2 IMPLANT
TOWEL GREEN STERILE FF (TOWEL DISPOSABLE) ×2 IMPLANT
TRAY FAXITRON CT DISP (TRAY / TRAY PROCEDURE) ×2 IMPLANT
TUBE CONNECTING 20X1/4 (TUBING) ×1 IMPLANT
YANKAUER SUCT BULB TIP NO VENT (SUCTIONS) ×1 IMPLANT

## 2020-08-02 NOTE — Interval H&P Note (Signed)
History and Physical Interval Note:  08/02/2020 7:42 AM  Ashley Pratt  has presented today for surgery, with the diagnosis of ABNORMAL RIGHT BREAST MAMMOGRAM.  The various methods of treatment have been discussed with the patient and family. After consideration of risks, benefits and other options for treatment, the patient has consented to  Procedure(s) with comments: RIGHT BREAST LUMPECTOMY WITH RADIOACTIVE SEED LOCALIZATION (Right) - RNFA as a surgical intervention.  The patient's history has been reviewed, patient examined, no change in status, stable for surgery.  I have reviewed the patient's chart and labs.  Questions were answered to the patient's satisfaction.     Stark Klein

## 2020-08-02 NOTE — Op Note (Signed)
Right Breast Radioactive seed localized excisional biopsy  Indications: This patient presents with history of abnormal right mammogram with discordant core needle biopsy.    Pre-operative Diagnosis: abnormal right mammogram    Post-operative Diagnosis: abnormal right mammogram  Surgeon: Stark Klein   Anesthesia: General endotracheal anesthesia  ASA Class: 2  Procedure Details  The patient was seen in the Holding Room. The risks, benefits, complications, treatment options, and expected outcomes were discussed with the patient. The possibilities of bleeding, infection, the need for additional procedures, failure to diagnose a condition, and creating a complication requiring transfusion or operation were discussed with the patient. The patient concurred with the proposed plan, giving informed consent.  The site of surgery properly noted/marked. The patient was taken to Operating Room # 1, identified, and the procedure verified as Right Breast seed localized excisional biopsy. A Time Out was held and the above information confirmed.  The right breast and chest were prepped and draped in standard fashion. A transverse lateral incision was made as she had a prior incision in this region.   Dissection was carried down around the point of maximum signal intensity. The cautery was used to perform the dissection.   The specimen was inked with the margin marker paint kit.    Specimen radiography confirmed inclusion of the mammographic lesion, the clip, and the seed.  The background signal in the breast was zero.  One clip was placed in the breast cavity.  Hemostasis was achieved with cautery.  There was a small amount of posterior tissue that would represent the posterior margin that was nearly completely devascularized, so this was taken and marked as posterior margin.  30 mL of local was administered into the breast tissue.  The wound was irrigated and closed with 3-0 vicryl interrupted deep dermal sutures  and 4-0 monocryl running subcuticular suture.      Sterile dressings were applied. At the end of the operation, all sponge, instrument, and needle counts were correct.  Findings: Seed, clip in specimen.  Anterior margin is skin of nipple/areola.    Estimated Blood Loss:  min         Specimens: right breast tissue with seed, additional posterior margin         Complications:  None; patient tolerated the procedure well.         Disposition: PACU - hemodynamically stable.         Condition: stable

## 2020-08-02 NOTE — Anesthesia Preprocedure Evaluation (Signed)
Anesthesia Evaluation  Patient identified by MRN, date of birth, ID band Patient awake    Reviewed: Allergy & Precautions, H&P , NPO status , Patient's Chart, lab work & pertinent test results  Airway Mallampati: II   Neck ROM: full    Dental   Pulmonary neg pulmonary ROS,    breath sounds clear to auscultation       Cardiovascular hypertension,  Rhythm:regular Rate:Normal     Neuro/Psych    GI/Hepatic GERD  ,  Endo/Other    Renal/GU      Musculoskeletal  (+) Arthritis ,   Abdominal   Peds  Hematology   Anesthesia Other Findings   Reproductive/Obstetrics                             Anesthesia Physical Anesthesia Plan  ASA: II  Anesthesia Plan: General   Post-op Pain Management:    Induction: Intravenous  PONV Risk Score and Plan: 3 and Ondansetron, Dexamethasone, Midazolam and Treatment may vary due to age or medical condition  Airway Management Planned: LMA  Additional Equipment:   Intra-op Plan:   Post-operative Plan: Extubation in OR  Informed Consent: I have reviewed the patients History and Physical, chart, labs and discussed the procedure including the risks, benefits and alternatives for the proposed anesthesia with the patient or authorized representative who has indicated his/her understanding and acceptance.       Plan Discussed with: CRNA, Anesthesiologist and Surgeon  Anesthesia Plan Comments:         Anesthesia Quick Evaluation

## 2020-08-02 NOTE — Discharge Instructions (Addendum)
[place] Office Phone Number [phone]  BREAST BIOPSY/ PARTIAL MASTECTOMY: POST OP INSTRUCTIONS  Always review your discharge instruction sheet given to you by the facility where your surgery was performed.  IF YOU HAVE DISABILITY OR FAMILY LEAVE FORMS, YOU MUST BRING THEM TO THE OFFICE FOR PROCESSING.  DO NOT GIVE THEM TO YOUR DOCTOR.  1. A prescription for pain medication may be given to you upon discharge.  Take your pain medication as prescribed, if needed.  If narcotic pain medicine is not needed, then you may take acetaminophen (Tylenol) or ibuprofen (Advil) as needed. No Tylenol until 2:00pm if needed 2. Take your usually prescribed medications unless otherwise directed 3. If you need a refill on your pain medication, please contact your pharmacy.  They will contact our office to request authorization.  Prescriptions will not be filled after 5pm or on week-ends. 4. You should eat very light the first 24 hours after surgery, such as soup, crackers, pudding, etc.  Resume your normal diet the day after surgery. 5. Most patients will experience some swelling and bruising in the breast.  Ice packs and a good support bra will help.  Swelling and bruising can take several days to resolve.  6. It is common to experience some constipation if taking pain medication after surgery.  Increasing fluid intake and taking a stool softener will usually help or prevent this problem from occurring.  A mild laxative (Milk of Magnesia or Miralax) should be taken according to package directions if there are no bowel movements after 48 hours. 7. Unless discharge instructions indicate otherwise, you may remove your bandages 48 hours after surgery, and you may shower at that time.  You may have steri-strips (small skin tapes) in place directly over the incision.  These strips should be left on the skin for 7-10 days.   Any sutures or staples will be removed at the office during your follow-up  visit. 8. ACTIVITIES:  You may resume regular daily activities (gradually increasing) beginning the next day.  Wearing a good support bra or sports bra (or the breast binder) minimizes pain and swelling.  You may have sexual intercourse when it is comfortable. a. You may drive when you no longer are taking prescription pain medication, you can comfortably wear a seatbelt, and you can safely maneuver your car and apply brakes. b. RETURN TO WORK:  __________1 week_______________ 9. You should see your doctor in the office for a follow-up appointment approximately two weeks after your surgery.  Your doctor's nurse will typically make your follow-up appointment when she calls you with your pathology report.  Expect your pathology report 2-3 business days after your surgery.  You may call to check if you do not hear from [place] after three days.   WHEN TO CALL YOUR DOCTOR: 1. Fever over 101.0 2. Nausea and/or vomiting. 3. Extreme swelling or bruising. 4. Continued bleeding from incision. 5. Increased pain, redness, or drainage from the incision.  The clinic staff is available to answer your questions during regular business hours.  Please don't hesitate to call and ask to speak to one of the nurses for clinical concerns.  If you have a medical emergency, go to the nearest emergency room or call 911.  A surgeon from [facility] is always on call at the hospital.  For further questions, please visit [email or web address]    Post Anesthesia Home Care Instructions  Activity: Get plenty of rest for the remainder of the day. A responsible individual  must stay with you for 24 hours following the procedure.  For the next 24 hours, DO NOT: -Drive a car -Paediatric nurse -Drink alcoholic beverages -Take any medication unless instructed by your physician -Make any legal decisions or sign important papers.  Meals: Start with liquid foods such as gelatin or soup. Progress to regular foods  as tolerated. Avoid greasy, spicy, heavy foods. If nausea and/or vomiting occur, drink only clear liquids until the nausea and/or vomiting subsides. Call your physician if vomiting continues.  Special Instructions/Symptoms: Your throat may feel dry or sore from the anesthesia or the breathing tube placed in your throat during surgery. If this causes discomfort, gargle with warm salt water. The discomfort should disappear within 24 hours.  If you had a scopolamine patch placed behind your ear for the management of post- operative nausea and/or vomiting:  1. The medication in the patch is effective for 72 hours, after which it should be removed.  Wrap patch in a tissue and discard in the trash. Wash hands thoroughly with soap and water. 2. You may remove the patch earlier than 72 hours if you experience unpleasant side effects which may include dry mouth, dizziness or visual disturbances. 3. Avoid touching the patch. Wash your hands with soap and water after contact with the patch.

## 2020-08-02 NOTE — Anesthesia Procedure Notes (Signed)
Procedure Name: LMA Insertion Performed by: Willa Frater, CRNA Pre-anesthesia Checklist: Patient identified, Emergency Drugs available, Suction available and Patient being monitored Patient Re-evaluated:Patient Re-evaluated prior to induction Oxygen Delivery Method: Circle system utilized Preoxygenation: Pre-oxygenation with 100% oxygen Induction Type: IV induction Ventilation: Mask ventilation without difficulty LMA: LMA inserted LMA Size: 3.0 Number of attempts: 1 Airway Equipment and Method: Bite block Placement Confirmation: positive ETCO2 Tube secured with: Tape Dental Injury: Teeth and Oropharynx as per pre-operative assessment

## 2020-08-02 NOTE — Transfer of Care (Signed)
Immediate Anesthesia Transfer of Care Note  Patient: Ashley Pratt  Procedure(s) Performed: RIGHT BREAST LUMPECTOMY WITH RADIOACTIVE SEED LOCALIZATION (Right Breast)  Patient Location: PACU  Anesthesia Type:General  Level of Consciousness: awake, drowsy and patient cooperative  Airway & Oxygen Therapy: Patient Spontanous Breathing and Patient connected to face mask oxygen  Post-op Assessment: Report given to RN and Post -op Vital signs reviewed and stable  Post vital signs: Reviewed and stable  Last Vitals:  Vitals Value Taken Time  BP 114/64 08/02/20 0917  Temp    Pulse 81 08/02/20 0922  Resp 13 08/02/20 0922  SpO2 100 % 08/02/20 0922  Vitals shown include unvalidated device data.  Last Pain:  Vitals:   08/02/20 0703  TempSrc: Oral      Patients Stated Pain Goal: 3 (63/78/58 [id number])  Complications: No complications documented.

## 2020-08-04 ENCOUNTER — Encounter (HOSPITAL_BASED_OUTPATIENT_CLINIC_OR_DEPARTMENT_OTHER): Payer: Self-pay | Admitting: General Surgery

## 2020-08-04 LAB — SURGICAL PATHOLOGY

## 2020-08-09 NOTE — Anesthesia Postprocedure Evaluation (Signed)
Anesthesia Post Note  Patient: Ashley Pratt  Procedure(s) Performed: RIGHT BREAST LUMPECTOMY WITH RADIOACTIVE SEED LOCALIZATION (Right Breast)     Patient location during evaluation: PACU Anesthesia Type: General Level of consciousness: awake and alert Pain management: pain level controlled Vital Signs Assessment: post-procedure vital signs reviewed and stable Respiratory status: spontaneous breathing, nonlabored ventilation, respiratory function stable and patient connected to nasal cannula oxygen Cardiovascular status: blood pressure returned to baseline and stable Postop Assessment: no apparent nausea or vomiting Anesthetic complications: no   No complications documented.  Last Vitals:  Vitals:   08/02/20 0945 08/02/20 1014  BP: (!) 150/74 (!) 149/77  Pulse: 80 78  Resp: 16 16  Temp:  36.4 C  SpO2: 99% 98%    Last Pain:  Vitals:   08/02/20 1014  TempSrc:   PainSc: [place]

## 2020-08-12 ENCOUNTER — Other Ambulatory Visit (HOSPITAL_COMMUNITY): Payer: Medicare Other

## 2020-08-26 NOTE — Progress Notes (Signed)
Ashley Pratt   [phone] [phone]   Clinic Follow up Note   Patient Care Team: Ashley Low, MD as PCP - General (Internal Medicine) Ashley Klein, MD as Consulting Physician (General Surgery)  Date of Service:  08/29/2020  CHIEF COMPLAINT: F/u of recurrent right breast LCIS  SUMMARY OF ONCOLOGIC HISTORY: Oncology History Overview Note  Cancer Staging Breast neoplasm LCIS (right) Staging form: Breast, AJCC 7th Edition - Clinical: Stage Unknown (Tis (LCIS), NX, cM0) - Signed by Ashley Levan, MD on 07/12/2014 - Pathologic: No stage assigned - Unsigned    Neoplasm of right breast, 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 This measures 2.1 x 0.7 x 2.2 cm.  At the  distal end of this hypoechoic area, there is another hypoechoic  area that may contain calcifications, 6 cm from the right nipple at  11 o'clock measuring 9 x 4 x 6 mm.  Findings are concerning for  possible carcinoma.    03/10/2013 Initial Biopsy   Diagnosis Breast, right, needle core biopsy, 9:30, 8cm/nipple - LOBULAR CARCINOMA IN SITU WITH NECROSIS AND CALCIFICATION, SEE COMMENT. Microscopic Comment There is extensive lobular carcinoma in situ, pleomorphic type, present with associated necrosis and calcification. The presence of the myoepithelial layer was confirmed with p63, smooth muscle mycin heavy chain, and calponin immunostains. The case was reviewed with Ashley. Gari Pratt who concurs. (CRR:caf 03/12/13)   05/11/2013 Surgery   RIGHT BREAST NEEDLE LOCALIZATION  LUMPECTOMY by Ashley Pratt    05/11/2013 Pathology Results   Diagnosis Breast, lumpectomy, Right - LOBULAR CARCINOMA IN SITU WITH CALCIFICATIONS, PARTIALLY INVOLVING AN INTRADUCTAL PAPILLOMA. - FIBROCYSTIC CHANGES  WITH CALCIFICATIONS. - HEALING BIOPSY SITE. - SEE COMMENT. Microscopic Comment Immunohistochemical stains for smooth muscle myosin, calponin, p63 and cytokeratin AE1/AE3 fail to highlight the presence of invasive carcinoma. A cytokeratin 5/6 stain is negative for the presence of atypical ductal hyperplasia. The surgical resection margin(s) of the specimen were inked and microscopically evaluated. (JBK:caf 05/14/13)   07/18/2013 Imaging   DEXA  Osteopenia with Lowest T-score -1.7 at [facility]    06/2013 - 03/2016 Anti-estrogen oral therapy   She tried Tamoxifen and Exemestane. She was Intolerant to tamoxifen initially. Cumulative stiffness/ arthralgias from aromasin already resolved. She stopped on her own.    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 This patient is considered osteopenic according to [facility]) criteria. There has been a statistically significant increase in BMD of Lumbar spine and no statistically significant change in left hip since prior exam dated 07/28/2013.   05/09/2020 Mammogram   There are developing grouped calcifications in the anterior third of the upper inner quadrant of the right breast spanning an area of 5 mm. They are indeterminate.    05/13/2020 Relapse/Recurrence   Diagnosis Breast, right, needle core biopsy, UIQ - MAMMARY CARCINOMA IN-SITU WITH NECROSIS AND CALCIFICATIONS - SEE COMMENT Microscopic Comment There is a focus concerning but not definitive for microinvasion. Based on the biopsy, the carcinoma in situ has a pleomorphic pattern, high nuclear grade and measures 0.4 cm in greatest linear  extent. E-cadherin is pending and will be reported in an addendum. Ashley. Jeannie Pratt reviewed the case and agrees with the above diagnosis.  These results were called to [facility] on May 16, 2020.   08/02/2020 Surgery   RIGHT BREAST LUMPECTOMY WITH RADIOACTIVE SEED LOCALIZATION by Ashley Pratt    08/02/2020 Pathology Results   FINAL MICROSCOPIC DIAGNOSIS:   A. BREAST, RIGHT, LUMPECTOMY:  - Biopsy site.  Lobular carcinoma in situ.  Usual duct epithelial  hyperplasia.  Fibrocystic change.   B. BREAST, RIGHT, ADDITIONAL POSTERIOR MARGIN, EXCISION:  - Usual duct epithelial hyperplasia.  Fibrocystic change.   COMMENT:   A. The lobular carcinoma in situ observed is not of the pleomorphic  type.   B. E-cadherin and CK 5/6 are positive in usual duct epithelial  hyperplasia.    08/2020 -  Anti-estrogen oral therapy   Tamoxifen 10mg  once daily starting in 08/2020      CURRENT THERAPY:  Tamoxifen 10mg  once daily starting in 08/2020   INTERVAL HISTORY:  Ashley Pratt is here for a follow up. She presents to the clinic today with her husband. She notes her surgery went well. She has pain at incision site. She also notes pain in her right arm which start 1 week after surgery. She has normal ROM and she has shingles rash on her right arm, back. She is currently on medication for it. She feels her BP was uncontrolled on prior antiestrogen therapy.    REVIEW OF SYSTEMS:   Constitutional: Denies fevers, chills or abnormal weight loss Eyes: Denies blurriness of vision Ears, nose, mouth, throat, and face: Denies mucositis or sore throat Respiratory: Denies cough, dyspnea or wheezes Cardiovascular: Denies palpitation, chest discomfort or lower extremity swelling Gastrointestinal:  Denies nausea, heartburn or change in bowel habits Skin: Denies abnormal skin rashes Lymphatics: Denies new lymphadenopathy or easy bruising Neurological:Denies numbness, tingling or new weaknesses Behavioral/Psych: Mood is stable, no new changes  All other systems were reviewed with the patient and are negative.  MEDICAL  HISTORY:  Past Medical History:  Diagnosis Date  . Arthritis   . Chronic back pain   . Colon polyps   . GERD (gastroesophageal reflux disease)   . Heart murmur   . Hypertension   . Malignant neoplasm of right female breast ([place])    unspecified site of breast  . 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 Prediabetes     SURGICAL HISTORY: Past Surgical History:  Procedure Laterality Date  . BREAST EXCISIONAL BIOPSY Right 2014  . BREAST LUMPECTOMY Right 05/11/2013   high risk lumpectomy  . BREAST LUMPECTOMY WITH NEEDLE LOCALIZATION Right 05/11/2013   Procedure: RIGHT BREAST NEEDLE LOCALIZATION  LUMPECTOMY;  Surgeon: Ashley Klein, MD;  Location: [place];  Service: General;  Laterality: Right;  . BREAST LUMPECTOMY WITH RADIOACTIVE SEED LOCALIZATION Right 08/02/2020   Procedure: RIGHT BREAST LUMPECTOMY WITH RADIOACTIVE SEED LOCALIZATION;  Surgeon: Ashley Klein, MD;  Location: [place];  Service: General;  Laterality: Right;  RNFA  . CHOLECYSTECTOMY    . COLONOSCOPY    . FOOT OSTEOTOMY     both  feet  . TONSILLECTOMY      I have reviewed the social history and family history with the patient and they are unchanged from previous note.  ALLERGIES:  is allergic to aspirin and penicillins.  MEDICATIONS:  Current Outpatient Medications  Medication Sig Dispense Refill  . gabapentin (NEURONTIN) 300 MG  capsule Take 300 mg by mouth at bedtime.    Marland Kitchen losartan (COZAAR) 50 MG tablet Take 1 tablet by mouth daily.  1  . Multiple Vitamin (MULTIVITAMIN) capsule Take 1 capsule by mouth daily.    . tamoxifen (NOLVADEX) 10 MG tablet Take 1 tablet (10 mg total) by mouth 2 (two) times daily. 90 tablet 1   No current facility-administered medications for this visit.    PHYSICAL EXAMINATION: ECOG PERFORMANCE STATUS: 1 - Symptomatic but completely ambulatory  Vitals:   08/29/20 1109  BP: 140/80  Pulse: 85  Resp: 20  Temp: 98 F (36.7 C)  SpO2:  100%   Filed Weights   08/29/20 1109  Weight: 139 lb 12.8 oz (63.4 kg)    GENERAL:alert, no distress and comfortable SKIN: skin color, texture, turgor are normal, no rashes or significant lesions EYES: normal, Conjunctiva are pink and non-injected, sclera clear  NECK: supple, thyroid normal size, non-tender, without nodularity LYMPH:  no palpable lymphadenopathy in the cervical, axillary  LUNGS: clear to auscultation and percussion with normal breathing effort HEART: regular rate & rhythm and no murmurs and no lower extremity edema ABDOMEN:abdomen soft, non-tender and normal bowel sounds Musculoskeletal:no cyanosis of digits and no clubbing  NEURO: alert & oriented x 3 with fluent speech, no focal motor/sensory deficits BREAST: S/p right lumpectomy: Surgical incisions healed well with scar tissue. No palpable mass, nodules or adenopathy bilaterally. Breast exam benign.   LABORATORY DATA:  I have reviewed the data as listed CBC Latest Ref Rng & Units 10/17/2015 03/07/2015 07/12/2014  WBC 3.9 - 10.3 10e3/uL 9.0 8.4 9.0  Hemoglobin 11.6 - 15.9 g/dL 12.8 12.5 12.8  Hematocrit 34 - 46 % 40.8 38.2 40.0  Platelets 145 - 400 10e3/uL 212 200 227     CMP Latest Ref Rng & Units 04/10/2016 10/17/2015 03/07/2015  Glucose 65 - 99 mg/dL 88 120 100  BUN 7 - 25 mg/dL 13 14.5 14.7  Creatinine 0.50 - 0.99 mg/dL 0.87 0.9 0.9  Sodium 135 - 146 mmol/L 136 139 138  Potassium 3.5 - 5.3 mmol/L 4.3 4.1 4.3  Chloride 98 - 110 mmol/L 98 - -  CO2 20 - 31 mmol/L 26 28 25   Calcium 8.6 - 10.4 mg/dL 9.5 10.1 9.8  Total Protein 6.4 - 8.3 g/dL - 7.5 6.7  Total Bilirubin 0.20 - 1.20 mg/dL - 0.33 0.56  Alkaline Phos 40 - 150 U/L - 103 56  AST 5 - 34 U/L - 24 20  ALT 0 - 55 U/L - 32 20      RADIOGRAPHIC STUDIES: I have personally reviewed the radiological images as listed and agreed with the findings in the report. No results found.   ASSESSMENT & PLAN:  Ashley Pratt is a 69 y.o. female with     1. Right breast LCIS, High grade with necrosis, pleomorphic, recurrence in 04/2020  -She was initially diagnosed with LCIS in 02/2013. She was treated with right lumpectomy with Ashley Pratt and about 3 years of antiestrogen therapy, which she stopped on her own due to poor tolerance.  -On 04/19/20 screening mammogram she was found to have calcification in her right breast. Further workup in 04/2020 showed 56mm of LCIS in her UIQ of right breast. I discussed her biopsy results which showed high-grade LCIS with necrosis and calcification, with multifocal foci. -She underwent right lumpectomy with Ashley Pratt on 08/02/20. I discussed her pathology which shows LCIS no evidence of invasive cancer. Her LCIS was be cured by  complete surgical resection. Any form of adjuvant therapy is preventive.  -I discussed her high risk of further cancer given her LCIS which is about 1/5% every year on avareage. I discussed healthy diet and regualr exercise, and chemoprevention with antiestrogen therapy to reduce her risk. She tried exemestane and Tamoxifen in the past with moderately toleration. I recommend Tamoxifen with Pratt dose 10mg  daily to see if she tolerates. She is willing to try. If she does not tolerate will proceed with frequent screening only.  -We also discussed the breast cancer surveillance after her surgery. She will continue annual screening mammogram, self exams, and a routine office visit with lab and exam with [place]. I discussed the option of additional screening with annual breast MRIs, due to her high risk (>20% in life time). She is interested. Start next year in 2022 -Phone call in 3 months and OV in 6 months.    2. HTN, GERD, Arthritis, Heart murmur  -Only on Losartan and controlled.  -She will continue to f/u with PCP and cardiologist   3. Shingles and postherpetic neuralgia -She has shingles rash of right arm and upper back. She notes significant pain with this.  -She is on Gabapentin 300mg  and  will complete antibiotics tomorrow.   -I also suggested acupuncture if pain does not improve or resolve after a few months.   PLAN:  -I called in Tamoxifen 10mg  today  -Phone call in 3 months to see how she is tolerating tamoxifen  -Lab and F/u in 6 months  -She postponed flu shot for now -Copy Ashley. Barry Pratt   No problem-specific Assessment & Plan notes found for this encounter.   No orders of the defined types were placed in this encounter.  All questions were answered. The patient knows to call the clinic with any problems, questions or concerns. No barriers to learning was detected. The total time spent in the appointment was 30 minutes.     Truitt Merle, MD 08/29/2020   I, Joslyn Devon, am acting as scribe for Truitt Merle, MD.   I have reviewed the above documentation for accuracy and completeness, and I agree with the above.

## 2020-08-29 ENCOUNTER — Telehealth: Payer: Self-pay | Admitting: Hematology

## 2020-08-29 ENCOUNTER — Inpatient Hospital Stay: Payer: Medicare Other | Attending: Hematology | Admitting: Hematology

## 2020-08-29 ENCOUNTER — Encounter: Payer: Self-pay | Admitting: Hematology

## 2020-08-29 ENCOUNTER — Other Ambulatory Visit: Payer: Self-pay

## 2020-08-29 VITALS — BP 140/80 | HR 85 | Temp 98.0°F | Resp 20 | Ht 60.0 in | Wt 139.8 lb

## 2020-08-29 DIAGNOSIS — D0501 Lobular carcinoma in situ of right breast: Secondary | ICD-10-CM | POA: Diagnosis present

## 2020-08-29 DIAGNOSIS — I10 Essential (primary) hypertension: Secondary | ICD-10-CM | POA: Insufficient documentation

## 2020-08-29 DIAGNOSIS — K219 Gastro-esophageal reflux disease without esophagitis: Secondary | ICD-10-CM | POA: Insufficient documentation

## 2020-08-29 DIAGNOSIS — B0229 Other postherpetic nervous system involvement: Secondary | ICD-10-CM | POA: Diagnosis not present

## 2020-08-29 DIAGNOSIS — B029 Zoster without complications: Secondary | ICD-10-CM | POA: Insufficient documentation

## 2020-08-29 MED ORDER — TAMOXIFEN CITRATE 10 MG PO TABS: 10.0000 mg | ORAL_TABLET | Freq: Two times a day (BID) | ORAL | 1 refills | Status: DC

## 2020-08-29 NOTE — Telephone Encounter (Signed)
Scheduled per 11/1 los. Printed avs and calendar for pt.  

## 2020-09-14 ENCOUNTER — Ambulatory Visit (INDEPENDENT_AMBULATORY_CARE_PROVIDER_SITE_OTHER): Payer: Medicare Other | Admitting: Orthopaedic Surgery

## 2020-09-14 ENCOUNTER — Other Ambulatory Visit: Payer: Self-pay

## 2020-09-14 ENCOUNTER — Encounter: Payer: Self-pay | Admitting: Orthopaedic Surgery

## 2020-09-14 ENCOUNTER — Ambulatory Visit: Payer: Self-pay

## 2020-09-14 DIAGNOSIS — M25552 Pain in left hip: Secondary | ICD-10-CM

## 2020-09-14 NOTE — Progress Notes (Signed)
Office Visit Note   Patient: Ashley Pratt           Date of Birth: Apr 02, 1951           MRN: [id number] Visit Date: 09/14/2020              Requested by: Wenda Low, MD [address] [place],   [address] PCP: Wenda Low, MD   Assessment & Plan: Visit Diagnoses:  1. Pain in left hip     Plan: It is hard to tell whether the patient's symptoms are truly coming from her back alone or additionally from the hip.  I feel it is appropriate to proceed with MRI of the lumbar spine to further evaluate for structural abnormalities.  She will follow up with [place] once has been completed.  Total face to face encounter time was greater than 45 minutes and over half of this time was spent in counseling and/or coordination of care.  Follow-Up Instructions: Return for after MRI lumbar spine.   Orders:  Orders Placed This Encounter  Procedures  . XR HIP UNILAT W OR W/O PELVIS 2-3 VIEWS LEFT  . MR Hip Left w/o contrast   No orders of the defined types were placed in this encounter.     Procedures: No procedures performed   Clinical Data: No additional findings.   Subjective: Chief Complaint  Patient presents with  . Left Hip - Pain    HPI patient is a very pleasant 69 year old female who comes in today with left hip pain for the past 2 months.  No known injury or change in activity.  The pain she has is primarily to the groin and anterior thigh.  She describes this as a sharp shooting intermittent pain.  She also notes numbness to the lateral thigh as well as a cold water sensation that runs down the anterior thigh.  Standing seems to aggravate her symptoms.  She has been taking gabapentin for over 3 weeks without relief of symptoms.  No bowel or bladder dysfunction.  She does have a longstanding history of back problems but this does not feel similar to what she is previously experienced.  No previous epidural steroid injection or surgical intervention  to the back.  No previous cortisone injection to the hip.  Review of Systems as detailed in HPI.  All others reviewed and are negative.   Objective: Vital Signs: There were no vitals taken for this visit.  Physical Exam well-developed well-nourished female no acute distress.  Alert oriented x3.  Ortho Exam left hip exam shows negative logroll.  Negative FADIR.  Negative straight leg raise.  She does have slight increased pain with resisted abduction of the hips.  No focal weakness.  She is neurovascular intact distally.  Specialty Comments:  No specialty comments available.  Imaging: XR HIP UNILAT W OR W/O PELVIS 2-3 VIEWS LEFT  Result Date: 09/14/2020 Mild to moderate degenerative changes    PMFS History: Patient Active Problem List   Diagnosis Date Noted  . 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 Murmur 02/12/2017  . Dyspnea on exertion 02/12/2017  . 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 High risk medication use 03/09/2015  . Estrogen deficiency 03/09/2015  . Osteopenia determined by x-ray 03/09/2015  . Breast neoplasm LCIS (right) 04/27/2013  . Essential hypertension 04/10/2012  . Arthritis 04/10/2012   Past Medical History:  Diagnosis Date  .  Arthritis   . Chronic back pain   . Colon polyps   . GERD (gastroesophageal reflux disease)   . Heart murmur   . Hypertension   . Malignant neoplasm of right female breast ([place])    unspecified site of breast  . 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 Prediabetes     Family History  Problem Relation Age of Onset  . Hypertension Mother   . Hypertension Sister   . Hypertension Brother     Past Surgical History:  Procedure Laterality Date  . BREAST EXCISIONAL BIOPSY Right 2014  . BREAST LUMPECTOMY Right 05/11/2013   high risk lumpectomy  . BREAST LUMPECTOMY WITH NEEDLE LOCALIZATION Right 05/11/2013   Procedure: RIGHT  BREAST NEEDLE LOCALIZATION  LUMPECTOMY;  Surgeon: Stark Klein, MD;  Location: [facility];  Service: General;  Laterality: Right;  . BREAST LUMPECTOMY WITH RADIOACTIVE SEED LOCALIZATION Right 08/02/2020   Procedure: RIGHT BREAST LUMPECTOMY WITH RADIOACTIVE SEED LOCALIZATION;  Surgeon: Stark Klein, MD;  Location: [facility];  Service: General;  Laterality: Right;  RNFA  . CHOLECYSTECTOMY    . COLONOSCOPY    . FOOT OSTEOTOMY     both  feet  . TONSILLECTOMY     Social History   Occupational History  . Not on file  Tobacco Use  . Smoking status: Never Smoker  . Smokeless tobacco: Never Used  Substance and Sexual Activity  . Alcohol use: No  . Drug use: No  . Sexual activity: Yes

## 2020-10-02 ENCOUNTER — Ambulatory Visit
Admission: RE | Admit: 2020-10-02 | Discharge: 2020-10-02 | Disposition: A | Discharge status: Home / Self Care | Payer: Medicare Other | Source: Ambulatory Visit | Attending: Orthopaedic Surgery | Admitting: Orthopaedic Surgery

## 2020-10-02 ENCOUNTER — Other Ambulatory Visit: Payer: Self-pay

## 2020-10-02 DIAGNOSIS — M25552 Pain in left hip: Secondary | ICD-10-CM

## 2020-10-04 ENCOUNTER — Ambulatory Visit (INDEPENDENT_AMBULATORY_CARE_PROVIDER_SITE_OTHER): Payer: Medicare Other

## 2020-10-04 ENCOUNTER — Encounter: Payer: Self-pay | Admitting: Orthopaedic Surgery

## 2020-10-04 ENCOUNTER — Ambulatory Visit (INDEPENDENT_AMBULATORY_CARE_PROVIDER_SITE_OTHER): Payer: Medicare Other | Admitting: Orthopaedic Surgery

## 2020-10-04 ENCOUNTER — Other Ambulatory Visit: Payer: Self-pay

## 2020-10-04 DIAGNOSIS — M545 Low back pain, unspecified: Secondary | ICD-10-CM

## 2020-10-04 NOTE — Progress Notes (Signed)
Office Visit Note   Patient: Ashley Pratt           Date of Birth: 1951-03-12           MRN: [id number] Visit Date: 10/04/2020              Requested by: Wenda Low, MD [address] [place],  [place] [address] PCP: Wenda Low, MD   Assessment & Plan: Visit Diagnoses:  1. Low back pain, unspecified back pain laterality, unspecified chronicity, unspecified whether sciatica present     Plan: Impression is left lower extremity radiculopathy.  At this point, we will order an MRI of the lumbar spine to further assess for structural abnormalities.  She will follow up with [place] once that has been completed.  Follow-Up Instructions: Return for after lumbar spine mri.   Orders:  Orders Placed This Encounter  Procedures  . MR Lumbar Spine w/o contrast  . XR Lumbar Spine 2-3 Views   No orders of the defined types were placed in this encounter.     Procedures: No procedures performed   Clinical Data: No additional findings.   Subjective: Chief Complaint  Patient presents with  . Left Hip - Pain    HPI patient comes in today for follow-up of her left hip and left lower extremity pain and paresthesias.  She was seen by [place] last month for this.  An MRI of the lumbar spine was supposed to have been ordered but an MRI of the left hip was ordered instead.  MRI of the left hip was unremarkable.  The patient continues to have the same symptoms today.     Objective: Vital Signs: There were no vitals taken for this visit.    Ortho Exam stable left lower extremity exam  Specialty Comments:  No specialty comments available.  Imaging: X-rays demonstrate spondylosis L4-5.  Mild degenerative scoliosis noted as well.   PMFS History: Patient Active Problem List   Diagnosis Date Noted  . 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 Murmur 02/12/2017  . Dyspnea on exertion 02/12/2017  . Neoplasm of right breast, primary tumor staging  category Tis: lobular carcinoma in situ (LCIS) 03/09/2015  . High risk medication use 03/09/2015  . Estrogen deficiency 03/09/2015  . Osteopenia determined by x-ray 03/09/2015  . Breast neoplasm LCIS (right) 04/27/2013  . Essential hypertension 04/10/2012  . Arthritis 04/10/2012   Past Medical History:  Diagnosis Date  . Arthritis   . Chronic back pain   . Colon polyps   . GERD (gastroesophageal reflux disease)   . Heart murmur   . Hypertension   . Malignant neoplasm of right female breast ([place])    unspecified site of breast  . 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 Prediabetes     Family History  Problem Relation Age of Onset  . Hypertension Mother   . Hypertension Sister   . Hypertension Brother     Past Surgical History:  Procedure Laterality Date  . BREAST EXCISIONAL BIOPSY Right 2014  . BREAST LUMPECTOMY Right 05/11/2013   high risk lumpectomy  . BREAST LUMPECTOMY WITH NEEDLE LOCALIZATION Right 05/11/2013   Procedure: RIGHT BREAST NEEDLE LOCALIZATION  LUMPECTOMY;  Surgeon: Stark Klein, MD;  Location: [facility];  Service: General;  Laterality: Right;  . BREAST LUMPECTOMY WITH RADIOACTIVE SEED LOCALIZATION Right 08/02/2020   Procedure: RIGHT BREAST LUMPECTOMY WITH RADIOACTIVE SEED LOCALIZATION;  Surgeon: Stark Klein, MD;  Location: [facility];  Service: General;  Laterality: Right;  RNFA  . CHOLECYSTECTOMY    . COLONOSCOPY    . FOOT OSTEOTOMY     both  feet  . TONSILLECTOMY     Social History   Occupational History  . Not on file  Tobacco Use  . Smoking status: Never Smoker  . Smokeless tobacco: Never Used  Substance and Sexual Activity  . Alcohol use: No  . Drug use: No  . Sexual activity: Yes

## 2020-10-05 ENCOUNTER — Telehealth: Payer: Self-pay | Admitting: Orthopaedic Surgery

## 2020-10-05 NOTE — Telephone Encounter (Signed)
Pt called

## 2020-10-09 ENCOUNTER — Ambulatory Visit
Admission: RE | Admit: 2020-10-09 | Discharge: 2020-10-09 | Disposition: A | Discharge status: Home / Self Care | Payer: Medicare Other | Source: Ambulatory Visit | Attending: Orthopaedic Surgery | Admitting: Orthopaedic Surgery

## 2020-10-09 ENCOUNTER — Other Ambulatory Visit: Payer: Self-pay

## 2020-10-09 DIAGNOSIS — M545 Low back pain, unspecified: Secondary | ICD-10-CM

## 2020-10-12 ENCOUNTER — Telehealth: Payer: Self-pay | Admitting: Orthopaedic Surgery

## 2020-10-12 ENCOUNTER — Encounter: Payer: Self-pay | Admitting: Orthopaedic Surgery

## 2020-10-12 ENCOUNTER — Ambulatory Visit: Payer: Medicare Other | Admitting: Orthopaedic Surgery

## 2020-10-12 DIAGNOSIS — M5416 Radiculopathy, lumbar region: Secondary | ICD-10-CM

## 2020-10-12 NOTE — Telephone Encounter (Signed)
I talked with patient husband at length. He was requesting referral to therapy facility that had only female therapist and saw only female patients. I advised I was not sure of a facility anywhere near here that would meet his expectations but we were certainly happy to refer if so. I did let him know that if you ok'd PT referral we could put referral in for upstairs and request that Colletta [place] see patient since she is requesting female therapist only. Barry for PT order? Also he is asking for something for pain for his wife. Please advise. Thanks.

## 2020-10-12 NOTE — Telephone Encounter (Signed)
Please see note Lauren F.

## 2020-10-12 NOTE — Telephone Encounter (Signed)
I talked with patients husband at length. See previous note for further documentation.

## 2020-10-12 NOTE — Progress Notes (Signed)
Office Visit Note   Patient: Ashley Pratt           Date of Birth: September 25, 1951           MRN: [id number] Visit Date: 10/12/2020              Requested by: Wenda Low, MD [address] [place] [address],  [place] [address] PCP: Wenda Low, MD   Assessment & Plan: Visit Diagnoses:  1. Radiculopathy, lumbar region     Plan: Left lower extremity radiculopathy with spinal canal stenosis worse at L4-5.  We have discussed oral medications to include steroids and muscle relaxers in conjunction with physical therapy versus epidural steroid injection or referral to neurosurgery.  The patient does not want any medications nor epidural injections or surgery.  She would like physical therapy but does not want to start this until next year when her insurance changes.  She will likely discuss this with her primary care provider who will start her in therapy.  I have agreed to send in a prescription of therapy if needed.  She will call and let us know.  Follow-Up Instructions: Return if symptoms worsen or fail to improve.   Orders:  No orders of the defined types were placed in this encounter.  No orders of the defined types were placed in this encounter.     Procedures: No procedures performed   Clinical Data: No additional findings.   Subjective: Chief Complaint  Patient presents with  . Lower Back - Pain    HPI patient is a pleasant 69 year old female who comes in today to discuss MRI results of the lumbar spine.  She has been complaining of left hip pain primarily to the groin and anterior thigh for the past few months.  She is also been having lateral thigh numbness and feeling of cold water running down the anterior thigh.  MRI of the lumbar spine from 10/09/2020 shows multilevel degenerative changes worse L4-5 where there is moderate spinal canal stenosis.  Patient has not previously had epidural steroid injections or surgical intervention for the lumbar  spine.     Objective: Vital Signs: There were no vitals taken for this visit.    Ortho Exam stable lumbar exam  Specialty Comments:  No specialty comments available.  Imaging: No new imaging   PMFS History: Patient Active Problem List   Diagnosis Date Noted  . 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 Murmur 02/12/2017  . Dyspnea on exertion 02/12/2017  . 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 High risk medication use 03/09/2015  . Estrogen deficiency 03/09/2015  . Osteopenia determined by x-ray 03/09/2015  . Breast neoplasm LCIS (right) 04/27/2013  . Essential hypertension 04/10/2012  . Arthritis 04/10/2012   Past Medical History:  Diagnosis Date  . Arthritis   . Chronic back pain   . Colon polyps   . GERD (gastroesophageal reflux disease)   . Heart murmur   . Hypertension   . Malignant neoplasm of right female breast ([facility])    unspecified site of breast  . 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 Prediabetes     Family History  Problem Relation Age of Onset  . Hypertension Mother   . Hypertension Sister   . Hypertension Brother     Past Surgical History:  Procedure Laterality Date  . BREAST EXCISIONAL BIOPSY Right 2014  .  BREAST LUMPECTOMY Right 05/11/2013   high risk lumpectomy  . BREAST LUMPECTOMY WITH NEEDLE LOCALIZATION Right 05/11/2013   Procedure: RIGHT BREAST NEEDLE LOCALIZATION  LUMPECTOMY;  Surgeon: Stark Klein, MD;  Location: [facility];  Service: General;  Laterality: Right;  . BREAST LUMPECTOMY WITH RADIOACTIVE SEED LOCALIZATION Right 08/02/2020   Procedure: RIGHT BREAST LUMPECTOMY WITH RADIOACTIVE SEED LOCALIZATION;  Surgeon: Stark Klein, MD;  Location: [facility];  Service: General;  Laterality: Right;  RNFA  . CHOLECYSTECTOMY    . COLONOSCOPY    . FOOT OSTEOTOMY     both  feet  . TONSILLECTOMY     Social  History   Occupational History  . Not on file  Tobacco Use  . Smoking status: Never Smoker  . Smokeless tobacco: Never Used  Substance and Sexual Activity  . Alcohol use: No  . Drug use: No  . Sexual activity: Yes

## 2020-10-12 NOTE — Telephone Encounter (Signed)
Patient's husband Zulfiqar called requesting a referral for physical therapy and also need pain medication for her back pains. Please send to [facility] on [address] or [facility] [address] . Patient phone number is [phone]. Patient's husband is asking for a return call from [place].

## 2020-10-13 ENCOUNTER — Other Ambulatory Visit: Payer: Self-pay

## 2020-10-13 ENCOUNTER — Telehealth: Payer: Self-pay | Admitting: Orthopaedic Surgery

## 2020-10-13 ENCOUNTER — Other Ambulatory Visit: Payer: Self-pay | Admitting: Physician Assistant

## 2020-10-13 DIAGNOSIS — M5416 Radiculopathy, lumbar region: Secondary | ICD-10-CM

## 2020-10-13 MED ORDER — TRAMADOL HCL 50 MG PO TABS: 50.0000 mg | ORAL_TABLET | Freq: Three times a day (TID) | ORAL | 0 refills | Status: DC | PRN

## 2020-10-13 MED ORDER — TRAMADOL HCL 50 MG PO TABS
50.0000 mg | ORAL_TABLET | Freq: Three times a day (TID) | ORAL | 0 refills | Status: DC | PRN
Start: 2020-10-13 — End: 2021-01-02

## 2020-10-13 NOTE — Telephone Encounter (Signed)
Ok to do.  I asked multiple times about medicines and they declined.  I just sent in tramadol

## 2020-10-13 NOTE — Telephone Encounter (Signed)
resent

## 2020-10-13 NOTE — Telephone Encounter (Signed)
I called and lm on vm to advise that rx has been sent to pharmacy and also entered order for PT upstairs female therapist only. To call with any questions.

## 2020-10-13 NOTE — Telephone Encounter (Signed)
Patient's husband ask for medication to be sent to [facility] on [address]. Patient husband also asked for a call back from [place]. Phone number is [phone].

## 2020-10-13 NOTE — Telephone Encounter (Signed)
You sent in rx for tramadol for this pt and in the original message asked to have it sent to the [facility] [address]. Can you please resend rx to this pharmacy? Thank you

## 2020-10-17 ENCOUNTER — Other Ambulatory Visit: Payer: Self-pay | Admitting: Physician Assistant

## 2020-10-17 ENCOUNTER — Telehealth: Payer: Self-pay | Admitting: Orthopaedic Surgery

## 2020-10-17 MED ORDER — MELOXICAM 7.5 MG PO TABS: 7.5000 mg | ORAL_TABLET | Freq: Every day | ORAL | 2 refills | Status: DC | PRN

## 2020-10-17 NOTE — Telephone Encounter (Signed)
I discussed medications in detail at appointment.  Best to have him call pcp to further discuss and decide what medications he would like her to have

## 2020-10-17 NOTE — Telephone Encounter (Signed)
I called and sw pt's husband he said that pt is allergic to ASA and can not have anything with ASA in it. He also said that he will not give her the tramadol because of risk of GI bleed. Can you please call and discuss with pt's husband so decision can be made on what med to give pt.

## 2020-10-17 NOTE — Telephone Encounter (Signed)
Says she is allergic to aspirin, so just want to make them aware that is in chart since asking for mobic.  Sent to [facility] which is on file

## 2020-10-17 NOTE — Telephone Encounter (Signed)
Please see below and advise.

## 2020-10-17 NOTE — Telephone Encounter (Signed)
Patient's husband Zulifiqar called requesting a new medication Mobic. Please send to pharmacy on file. He is also asking for a call back when medication ha been sent in. Phone number is  [phone].

## 2020-10-18 NOTE — Telephone Encounter (Signed)
I called and lm on vm to advise of message below.  

## 2020-10-30 ENCOUNTER — Other Ambulatory Visit: Payer: Self-pay | Admitting: Hematology

## 2020-10-30 DIAGNOSIS — Z23 Encounter for immunization: Secondary | ICD-10-CM | POA: Diagnosis not present

## 2020-11-09 DIAGNOSIS — R7309 Other abnormal glucose: Secondary | ICD-10-CM | POA: Diagnosis not present

## 2020-11-09 DIAGNOSIS — I10 Essential (primary) hypertension: Secondary | ICD-10-CM | POA: Diagnosis not present

## 2020-11-09 DIAGNOSIS — M5136 Other intervertebral disc degeneration, lumbar region: Secondary | ICD-10-CM | POA: Diagnosis not present

## 2020-11-09 DIAGNOSIS — M8580 Other specified disorders of bone density and structure, unspecified site: Secondary | ICD-10-CM | POA: Diagnosis not present

## 2020-11-09 DIAGNOSIS — C50911 Malignant neoplasm of unspecified site of right female breast: Secondary | ICD-10-CM | POA: Diagnosis not present

## 2020-11-16 ENCOUNTER — Other Ambulatory Visit: Payer: Self-pay | Admitting: Internal Medicine

## 2020-11-16 DIAGNOSIS — R5381 Other malaise: Secondary | ICD-10-CM

## 2020-11-16 DIAGNOSIS — M8580 Other specified disorders of bone density and structure, unspecified site: Secondary | ICD-10-CM

## 2020-11-23 ENCOUNTER — Telehealth: Payer: Self-pay | Admitting: Hematology

## 2020-11-23 NOTE — Telephone Encounter (Signed)
Rescheduled upcoming appointment per provider's request. Patient's husband is aware of changes.

## 2020-11-30 ENCOUNTER — Ambulatory Visit: Payer: Medicare Other | Admitting: Hematology

## 2020-11-30 NOTE — Progress Notes (Incomplete)
[facility]   [phone] Fax:([phone]   Clinic Follow up Note   Patient Care Team: Wenda Low, MD as PCP - General (Internal Medicine) Stark Klein, MD as Consulting Physician (General Surgery)  Date of Service:  11/30/2020  CHIEF COMPLAINT:  F/u of recurrent right breast LCIS  SUMMARY OF ONCOLOGIC HISTORY: Oncology History Overview Note  Cancer Staging Breast neoplasm LCIS (right) Staging form: Breast, AJCC 7th Edition - Clinical: Stage Unknown (Tis (LCIS), NX, cM0) - Signed by Gordy Levan, MD on 07/12/2014 - Pathologic: No stage assigned - Unsigned    Neoplasm of right breast, 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 This measures 2.1 x 0.7 x 2.2 cm.  At the  distal end of this hypoechoic area, there is another hypoechoic  area that may contain calcifications, 6 cm from the right nipple at  11 o'clock measuring 9 x 4 x 6 mm.  Findings are concerning for  possible carcinoma.    03/10/2013 Initial Biopsy   Diagnosis Breast, right, needle core biopsy, 9:30, 8cm/nipple - LOBULAR CARCINOMA IN SITU WITH NECROSIS AND CALCIFICATION, SEE COMMENT. Microscopic Comment There is extensive lobular carcinoma in situ, pleomorphic type, present with associated necrosis and calcification. The presence of the myoepithelial layer was confirmed with p63, smooth muscle mycin heavy chain, and calponin immunostains. The case was reviewed with Dr. Gari Crown who concurs. (CRR:caf 03/12/13)   05/11/2013 Surgery   RIGHT BREAST NEEDLE LOCALIZATION  LUMPECTOMY by Dr Barry Dienes    05/11/2013 Pathology Results   Diagnosis Breast, lumpectomy, Right - LOBULAR CARCINOMA IN SITU WITH CALCIFICATIONS, PARTIALLY INVOLVING AN INTRADUCTAL PAPILLOMA. - FIBROCYSTIC CHANGES  WITH CALCIFICATIONS. - HEALING BIOPSY SITE. - SEE COMMENT. Microscopic Comment Immunohistochemical stains for smooth muscle myosin, calponin, p63 and cytokeratin AE1/AE3 fail to highlight the presence of invasive carcinoma. A cytokeratin 5/6 stain is negative for the presence of atypical ductal hyperplasia. The surgical resection margin(s) of the specimen were inked and microscopically evaluated. (JBK:caf 05/14/13)   07/18/2013 Imaging   DEXA  Osteopenia with Lowest T-score -1.7 at [facility]    06/2013 - 03/2016 Anti-estrogen oral therapy   She tried Tamoxifen and Exemestane. She was Intolerant to tamoxifen initially. Cumulative stiffness/ arthralgias from aromasin already resolved. She stopped on her own.    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 This patient is considered osteopenic according to [facility]) criteria. There has been a statistically significant increase in BMD of Lumbar spine and no statistically significant change in left hip since prior exam dated 07/28/2013.   05/09/2020 Mammogram   There are developing grouped calcifications in the anterior third of the upper inner quadrant of the right breast spanning an area of 5 mm. They are indeterminate.    05/13/2020 Relapse/Recurrence   Diagnosis Breast, right, needle core biopsy, UIQ - MAMMARY CARCINOMA IN-SITU WITH NECROSIS AND CALCIFICATIONS - SEE COMMENT Microscopic Comment There is a focus concerning but not definitive for microinvasion. Based on the biopsy, the carcinoma in situ has a pleomorphic pattern, high nuclear grade and measures 0.4 cm in greatest  linear extent. E-cadherin is pending and will be reported in an addendum. Dr. Jeannie Done reviewed the case and agrees with the above diagnosis.  These results were called to [facility] on May 16, 2020.   08/02/2020 Surgery   RIGHT BREAST LUMPECTOMY WITH RADIOACTIVE SEED LOCALIZATION by Dr Barry Dienes    08/02/2020 Pathology Results   FINAL MICROSCOPIC DIAGNOSIS:   A. BREAST, RIGHT, LUMPECTOMY:  - Biopsy site.  Lobular carcinoma in situ.  Usual duct epithelial  hyperplasia.  Fibrocystic change.   B. BREAST, RIGHT, ADDITIONAL POSTERIOR MARGIN, EXCISION:  - Usual duct epithelial hyperplasia.  Fibrocystic change.   COMMENT:   A. The lobular carcinoma in situ observed is not of the pleomorphic  type.   B. E-cadherin and CK 5/6 are positive in usual duct epithelial  hyperplasia.    08/2020 -  Anti-estrogen oral therapy   Tamoxifen 10mg  once daily starting in 08/2020      CURRENT THERAPY:  Tamoxifen 10mg  once daily starting in 08/2020   INTERVAL HISTORY: *** Ashley Pratt is here for a follow up. She presents to the clinic alone.    REVIEW OF SYSTEMS:  *** Constitutional: Denies fevers, chills or abnormal weight loss Eyes: Denies blurriness of vision Ears, nose, mouth, throat, and face: Denies mucositis or sore throat Respiratory: Denies cough, dyspnea or wheezes Cardiovascular: Denies palpitation, chest discomfort or lower extremity swelling Gastrointestinal:  Denies nausea, heartburn or change in bowel habits Skin: Denies abnormal skin rashes Lymphatics: Denies new lymphadenopathy or easy bruising Neurological:Denies numbness, tingling or new weaknesses Behavioral/Psych: Mood is stable, no new changes  All other systems were reviewed with the patient and are negative.  MEDICAL HISTORY:  Past Medical History:  Diagnosis Date  . Arthritis   . Chronic back pain   . Colon polyps   . GERD (gastroesophageal reflux disease)   . Heart murmur   . Hypertension   . Malignant neoplasm of right female breast ([facility])    unspecified site of breast  . Osteoarthritis    of the knee left worse  than right  . Prediabetes     SURGICAL HISTORY: Past Surgical History:  Procedure Laterality Date  . BREAST EXCISIONAL BIOPSY Right 2014  . BREAST LUMPECTOMY Right 05/11/2013   high risk lumpectomy  . BREAST LUMPECTOMY WITH NEEDLE LOCALIZATION Right 05/11/2013   Procedure: RIGHT BREAST NEEDLE LOCALIZATION  LUMPECTOMY;  Surgeon: Stark Klein, MD;  Location: [facility];  Service: General;  Laterality: Right;  . BREAST LUMPECTOMY WITH RADIOACTIVE SEED LOCALIZATION Right 08/02/2020   Procedure: RIGHT BREAST LUMPECTOMY WITH RADIOACTIVE SEED LOCALIZATION;  Surgeon: Stark Klein, MD;  Location: [facility];  Service: General;  Laterality: Right;  RNFA  . CHOLECYSTECTOMY    . COLONOSCOPY    . FOOT OSTEOTOMY     both  feet  . TONSILLECTOMY      I have reviewed the social history and family history with the patient and they are unchanged from previous note.  ALLERGIES:  is allergic to aspirin and penicillins.  MEDICATIONS:  Current Outpatient Medications  Medication Sig Dispense Refill  . gabapentin (NEURONTIN) 300 MG capsule Take 300 mg by mouth at bedtime.    Marland Kitchen losartan (COZAAR) 50 MG tablet Take 1 tablet by mouth daily.  1  . meloxicam (MOBIC) 7.5 MG tablet Take 1 tablet (7.5 mg total) by mouth daily as needed for up to 14 doses for pain. 30 tablet 2  . Multiple Vitamin (MULTIVITAMIN) capsule  Take 1 capsule by mouth daily.    . tamoxifen (NOLVADEX) 10 MG tablet TAKE 1 TABLET BY MOUTH  TWICE DAILY 180 tablet 3  . traMADol (ULTRAM) 50 MG tablet Take 1 tablet (50 mg total) by mouth 3 (three) times daily as needed. 30 tablet 0   No current facility-administered medications for this visit.    PHYSICAL EXAMINATION: ECOG PERFORMANCE STATUS: {CHL ONC ECOG PS:[id number]}  There were no vitals filed for this visit. There were no vitals filed for this visit. *** GENERAL:alert, no distress and comfortable SKIN: skin color, texture, turgor are normal, no rashes or  significant lesions EYES: normal, Conjunctiva are pink and non-injected, sclera clear {OROPHARYNX:no exudate, no erythema and lips, buccal mucosa, and tongue normal}  NECK: supple, thyroid normal size, non-tender, without nodularity LYMPH:  no palpable lymphadenopathy in the cervical, axillary {or inguinal} LUNGS: clear to auscultation and percussion with normal breathing effort HEART: regular rate & rhythm and no murmurs and no lower extremity edema ABDOMEN:abdomen soft, non-tender and normal bowel sounds Musculoskeletal:no cyanosis of digits and no clubbing  NEURO: alert & oriented x 3 with fluent speech, no focal motor/sensory deficits  LABORATORY DATA:  I have reviewed the data as listed CBC Latest Ref Rng & Units 10/17/2015 03/07/2015 07/12/2014  WBC 3.9 - 10.3 10e3/uL 9.0 8.4 9.0  Hemoglobin 11.6 - 15.9 g/dL 12.8 12.5 12.8  Hematocrit 34.8 - 46.6 % 40.8 38.2 40.0  Platelets 145 - 400 10e3/uL 212 200 227     CMP Latest Ref Rng & Units 04/10/2016 10/17/2015 03/07/2015  Glucose 65 - 99 mg/dL 88 120 100  BUN 7 - 25 mg/dL 13 14.5 14.7  Creatinine 0.50 - 0.99 mg/dL 0.87 0.9 0.9  Sodium 135 - 146 mmol/L 136 139 138  Potassium 3.5 - 5.3 mmol/L 4.3 4.1 4.3  Chloride 98 - 110 mmol/L 98 - -  CO2 20 - 31 mmol/L 26 28 25   Calcium 8.6 - 10.4 mg/dL 9.5 10.1 9.8  Total Protein 6.4 - 8.3 g/dL - 7.5 6.7  Total Bilirubin 0.20 - 1.20 mg/dL - 0.33 0.56  Alkaline Phos 40 - 150 U/L - 103 56  AST 5 - 34 U/L - 24 20  ALT 0 - 55 U/L - 32 20      RADIOGRAPHIC STUDIES: I have personally reviewed the radiological images as listed and agreed with the findings in the report. No results found.   ASSESSMENT & PLAN:  Tameshia Bonneville is a 70 y.o. female with   1.RightbreastLCIS,Highgradewith necrosis,pleomorphic,recurrence in 04/2020  -She was initially diagnosedwith LCISin 02/2013. She was treated with right lumpectomy with Dr Miles Costain about 3 years of antiestrogen therapy, which she  stopped on her own due to poor tolerance. -On 04/19/20 screening mammogram she was found to have calcification in her right breast. Further workup in 04/2020 showed 25mm of LCIS in her UIQ of right breast. Idiscussedher biopsy results which showed high-grade LCIS with necrosis and calcification, with multifocal foci. -She underwent right lumpectomy with Dr Barry Dienes on 08/02/20. I discussed her pathology which shows LCIS no evidence of invasive cancer. HerLCIS was be cured by complete surgical resection. Any form of adjuvant therapy is preventive.  -I discussed her high risk of further cancer given her LCIS which is about 1/5% every year on avareage. I discussed healthy diet and regualr exercise, and chemoprevention with antiestrogen therapy to reduce her risk. She tried exemestane and Tamoxifen in the past with moderately toleration. I recommend Tamoxifen with low dose 10mg   daily to see if she tolerates. She is willing to try. If she does not tolerate will proceed with frequent screening only.  -We also discussed the breast cancer surveillance after her surgery. She will continue annual screening mammogram, self exams, and a routine office visit with lab and exam with [place]. I discussed the option of additional screening with annual breast MRIs, due to her high risk (>20% in life time). She is interested. Start next year in 2022 -Phone call in 3 months and OV in 6 months.    2. HTN, GERD, Arthritis, Heart murmur  -Only on Losartan and controlled.  -She will continue to f/u with PCP and cardiologist  3. Shingles and postherpetic neuralgia -She has shingles rash of right arm and upper back. She notes significant pain with this.  -She is on Gabapentin 300mg  and will complete antibiotics tomorrow.   -I also suggested acupuncture if pain does not improve or resolve after a few months.   PLAN: -I called in Tamoxifen 10mg  today  -Phone call in 3 months to see how she is tolerating tamoxifen  -Lab and  F/u in 6 months  -She postponed flu shot for now -Copy Dr. Barry Dienes    No problem-specific Assessment & Plan notes found for this encounter.   No orders of the defined types were placed in this encounter.  All questions were answered. The patient knows to call the clinic with any problems, questions or concerns. No barriers to learning was detected. The total time spent in the appointment was {CHL ONC TIME VISIT - IRSWN:[id number]}.     Ashley Pratt 11/30/2020   Oneal Deputy, am acting as scribe for Truitt Merle, MD.   {Add scribe attestation statement}

## 2020-12-01 DIAGNOSIS — U071 COVID-19: Secondary | ICD-10-CM | POA: Diagnosis not present

## 2020-12-02 ENCOUNTER — Inpatient Hospital Stay: Payer: Medicare HMO | Admitting: Hematology

## 2020-12-05 ENCOUNTER — Telehealth: Payer: Self-pay | Admitting: Hematology

## 2020-12-05 NOTE — Telephone Encounter (Signed)
Scheduled appt per 2/4 sch msg - pt husband  is aware of appt date and time

## 2020-12-29 ENCOUNTER — Other Ambulatory Visit: Payer: Self-pay

## 2020-12-29 DIAGNOSIS — D0501 Lobular carcinoma in situ of right breast: Secondary | ICD-10-CM

## 2020-12-30 NOTE — Progress Notes (Signed)
Ashley Pratt   [phone] [phone]   Clinic Follow up Note   Patient Care Team: Wenda Low, MD as PCP - General (Internal Medicine) Stark Klein, MD as Consulting Physician (General Surgery)  Date of Service:  01/02/2021  CHIEF COMPLAINT: F/u of recurrent right breast LCIS  SUMMARY OF ONCOLOGIC HISTORY: Oncology History Overview Note  Cancer Staging Breast neoplasm LCIS (right) Staging form: Breast, AJCC 7th Edition - Clinical: Stage Unknown (Tis (LCIS), NX, cM0) - Signed by Gordy Levan, MD on 07/12/2014 - Pathologic: No stage assigned - Unsigned    Neoplasm of right breast, 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 This measures 2.1 x 0.7 x 2.2 cm.  At the  distal end of this hypoechoic area, there is another hypoechoic  area that may contain calcifications, 6 cm from the right nipple at  11 o'clock measuring 9 x 4 x 6 mm.  Findings are concerning for  possible carcinoma.    03/10/2013 Initial Biopsy   Diagnosis Breast, right, needle core biopsy, 9:30, 8cm/nipple - LOBULAR CARCINOMA IN SITU WITH NECROSIS AND CALCIFICATION, SEE COMMENT. Microscopic Comment There is extensive lobular carcinoma in situ, pleomorphic type, present with associated necrosis and calcification. The presence of the myoepithelial layer was confirmed with p63, smooth muscle mycin heavy chain, and calponin immunostains. The case was reviewed with Dr. Gari Crown who concurs. (CRR:caf 03/12/13)   05/11/2013 Surgery   RIGHT BREAST NEEDLE LOCALIZATION  LUMPECTOMY by Dr Barry Dienes    05/11/2013 Pathology Results   Diagnosis Breast, lumpectomy, Right - LOBULAR CARCINOMA IN SITU WITH CALCIFICATIONS, PARTIALLY INVOLVING AN INTRADUCTAL PAPILLOMA. - FIBROCYSTIC CHANGES  WITH CALCIFICATIONS. - HEALING BIOPSY SITE. - SEE COMMENT. Microscopic Comment Immunohistochemical stains for smooth muscle myosin, calponin, p63 and cytokeratin AE1/AE3 fail to highlight the presence of invasive carcinoma. A cytokeratin 5/6 stain is negative for the presence of atypical ductal hyperplasia. The surgical resection margin(s) of the specimen were inked and microscopically evaluated. (JBK:caf 05/14/13)   07/18/2013 Imaging   DEXA  Osteopenia with Lowest T-score -1.7 at [facility]    06/2013 - 03/2016 Anti-estrogen oral therapy   She tried Tamoxifen and Exemestane. She was Intolerant to tamoxifen initially. Cumulative stiffness/ arthralgias from aromasin already resolved. She stopped on her own.    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 This patient is considered osteopenic according to [facility]) criteria. There has been a statistically significant increase in BMD of Lumbar spine and no statistically significant change in left hip since prior exam dated 07/28/2013.   05/09/2020 Mammogram   There are developing grouped calcifications in the anterior third of the upper inner quadrant of the right breast spanning an area of 5 mm. They are indeterminate.    05/13/2020 Relapse/Recurrence   Diagnosis Breast, right, needle core biopsy, UIQ - MAMMARY CARCINOMA IN-SITU WITH NECROSIS AND CALCIFICATIONS - SEE COMMENT Microscopic Comment There is a focus concerning but not definitive for microinvasion. Based on the biopsy, the carcinoma in situ has a pleomorphic pattern, high nuclear grade and measures 0.4 cm in greatest linear  extent. E-cadherin is pending and will be reported in an addendum. Dr. Jeannie Done reviewed the case and agrees with the above diagnosis.  These results were called to [facility] on May 16, 2020.   08/02/2020 Surgery   RIGHT BREAST LUMPECTOMY WITH RADIOACTIVE SEED LOCALIZATION by Dr Barry Dienes    08/02/2020 Pathology Results   FINAL MICROSCOPIC DIAGNOSIS:   A. BREAST, RIGHT, LUMPECTOMY:  - Biopsy site.  Lobular carcinoma in situ.  Usual duct epithelial  hyperplasia.  Fibrocystic change.   B. BREAST, RIGHT, ADDITIONAL POSTERIOR MARGIN, EXCISION:  - Usual duct epithelial hyperplasia.  Fibrocystic change.   COMMENT:   A. The lobular carcinoma in situ observed is not of the pleomorphic  type.   B. E-cadherin and CK 5/6 are positive in usual duct epithelial  hyperplasia.    08/2020 -  Anti-estrogen oral therapy   Tamoxifen 10mg  once daily starting in 08/2020      CURRENT THERAPY:  Tamoxifen 10mg  once daily starting in 08/2020   INTERVAL HISTORY:  Ashley Pratt is here for a follow up. She presents to the clinic with her husband. She has some pain in her right breast occasionally. This is a light dull pain. This presented after surgery and will last for a few minutes before stopping. She does not require medication, she feels this is tolerable. She notes she changed pharmacy with change insurance. If she requires 90 day supply she will need her medication through mail order. Otherwise her preferred [facility]. I reviewed her medication list with her. She notes she does have issues with her 10mg  Tamoxifen as she feels this is contributing to her hypertension. She notes it has been in 164. She denies worsened joint pain or hot flashes from Tamoxifen. Her known knee pain is stable.     REVIEW OF SYSTEMS:   Constitutional: Denies fevers, chills or abnormal weight loss Eyes: Denies blurriness of vision Ears, nose, mouth, throat, and face: Denies mucositis or sore throat Respiratory: Denies cough, dyspnea or wheezes Cardiovascular: Denies palpitation, chest discomfort or lower extremity  swelling Gastrointestinal:  Denies nausea, heartburn or change in bowel habits Skin: Denies abnormal skin rashes MSK: (+) Stable known knee pain  Lymphatics: Denies new lymphadenopathy or easy bruising Neurological:Denies numbness, tingling or new weaknesses Behavioral/Psych: Mood is stable, no new changes  MSK: (+) Occasional right breast pain  All other systems were reviewed with the patient and are negative.  MEDICAL HISTORY:  Past Medical History:  Diagnosis Date  . Arthritis   . Chronic back pain   . Colon polyps   . GERD (gastroesophageal reflux disease)   . Heart murmur   . Hypertension   . Malignant neoplasm of right female breast ([place])    unspecified site of breast  . 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 Prediabetes     SURGICAL HISTORY: Past Surgical History:  Procedure Laterality Date  . BREAST EXCISIONAL BIOPSY Right 2014  . BREAST LUMPECTOMY Right 05/11/2013   high risk lumpectomy  . BREAST LUMPECTOMY WITH NEEDLE LOCALIZATION Right 05/11/2013   Procedure: RIGHT BREAST NEEDLE LOCALIZATION  LUMPECTOMY;  Surgeon: Stark Klein, MD;  Location: [place];  Service: General;  Laterality: Right;  . BREAST LUMPECTOMY WITH RADIOACTIVE SEED LOCALIZATION Right 08/02/2020   Procedure: RIGHT BREAST LUMPECTOMY WITH RADIOACTIVE SEED LOCALIZATION;  Surgeon: Stark Klein, MD;  Location: [place];  Service: General;  Laterality: Right;  RNFA  . CHOLECYSTECTOMY    .  COLONOSCOPY    . FOOT OSTEOTOMY     both  feet  . TONSILLECTOMY      I have reviewed the social history and family history with the patient and they are unchanged from previous note.  ALLERGIES:  is allergic to aspirin and penicillins.  MEDICATIONS:  Current Outpatient Medications  Medication Sig Dispense Refill  . tamoxifen (NOLVADEX) 10 MG tablet Take 1 tablet (10 mg total) by mouth daily. 90 tablet 1  . losartan (COZAAR) 50 MG tablet Take 1 tablet by mouth  daily.  1  . Multiple Vitamin (MULTIVITAMIN) capsule Take 1 capsule by mouth daily.     No current facility-administered medications for this visit.    PHYSICAL EXAMINATION: ECOG PERFORMANCE STATUS: 0 - Asymptomatic  Vitals:   01/02/21 1126  BP: (!) 174/79  Pulse: 73  Temp: 97.6 F (36.4 C)  SpO2: 100%   Filed Weights   01/02/21 1126  Weight: 142 lb 12.8 oz (64.8 kg)    GENERAL:alert, no distress and comfortable SKIN: skin color, texture, turgor are normal, no rashes or significant lesions EYES: normal, Conjunctiva are pink and non-injected, sclera clear  NECK: supple, thyroid normal size, non-tender, without nodularity LYMPH:  no palpable lymphadenopathy in the cervical, axillary  LUNGS: clear to auscultation and percussion with normal breathing effort HEART: regular rate and no lower extremity edema (+) Mild heart murmur ABDOMEN:abdomen soft, non-tender and normal bowel sounds (+) Possible minimal midline abdominal hernia.  Musculoskeletal:no cyanosis of digits and no clubbing  NEURO: alert & oriented x 3 with fluent speech, no focal motor/sensory deficits BREAST: S/p right lumpectomies: Surgical incision healed well with mild scar tissue. Left Breast exam benign.   LABORATORY DATA:  I have reviewed the data as listed CBC Latest Ref Rng & Units 01/02/2021 10/17/2015 03/07/2015  WBC 4.0 - 10.5 K/uL 8.8 9.0 8.4  Hemoglobin 12.0 - 15.0 g/dL 11.9(L) 12.8 12.5  Hematocrit 36.0 - 46.0 % 37.4 40.8 38.2  Platelets 150 - 400 K/uL 182 212 200     CMP Latest Ref Rng & Units 01/02/2021 04/10/2016 10/17/2015  Glucose 70 - 99 mg/dL 125(H) 88 120  BUN 8 - 23 mg/dL 15 13 14.5  Creatinine 0.44 - 1.00 mg/dL 0.91 0.87 0.9  Sodium 135 - 145 mmol/L 141 136 139  Potassium 3.5 - 5.1 mmol/L 4.0 4.3 4.1  Chloride 98 - 111 mmol/L 105 98 -  CO2 22 - 32 mmol/L 29 26 28   Calcium 8.9 - 10.3 mg/dL 9.0 9.5 10.1  Total Protein 6.5 - 8.1 g/dL 6.5 - 7.5  Total Bilirubin 0.3 - 1.2 mg/dL 0.3 - 0.33   Alkaline Phos 38 - 126 U/L 34(L) - 103  AST 15 - 41 U/L 20 - 24  ALT 0 - 44 U/L 17 - 32      RADIOGRAPHIC STUDIES: I have personally reviewed the radiological images as listed and agreed with the findings in the report. No results found.   ASSESSMENT & PLAN:  Ashley Pratt is a 70 y.o. female with   1.RightbreastLCIS,Highgradewith necrosis,pleomorphic,recurrence in 04/2020  -She was initially diagnosedwith LCISin 02/2013. She was treated with right lumpectomy with Dr Miles Costain about 3 years of antiestrogen therapy (exemestane and Tamoxifen), which she stopped on her own due to poor tolerance. -She has recurrence in based on 04/19/20 Mammogram and 04/2020 biopsy with high grade 53mm of LCIS in her UIQ of right breast with necrosis and calcification, and multifocal foci. -She was treated with Right lumpectomy with  Dr Barry Dienes on 08/02/20. HerLCIS was cured by complete surgical resection. Any form of adjuvant therapy is preventive.  -To reduce her risk of future breast cancer, I started her on Antiestrogen therapy with low dose 10mg  Tamoxifen daily for 5 years. She is tolerating well except her increasing BP. I advised her to increase HTN medication with her PCP for better management first.  -She is clinically doing well. She will have occasional right breast pain from her nerve damage. Labs reviewed, CBC and CMP WNL except Hg 11.9. Physical Exam benign. There is no clinical concern for recurrence.  -Continue surveillance. Next mammogram in 03/2021. I discussed the option of additional screening with annual breast MRIs. I also discussed Abbreviated MRIs which have $400 out-of-pocket cost if insurance does not cover this. She is interested, plan to start MRI in 09/2021.  -F/u in 6 months    2. HTN, GERD, Arthritis, Heart murmur  -Only on Losartan and controlled.  -She will continue to f/u with PCP and cardiologist -Pt attributed 10mg  Tamoxifen to increasing her HTN. She notes  this has reached 160 range. Today her BP is 174/79 (01/02/21). I discussed increasing Losartan with her PCP to better control her HTN.    3. Osteopenia -Her 02/2016 DEXA showed osteopenia with T-score -1.5.  -She plans to repeat on 03/16/21  4. Cancer Screening  -She has not had colonoscopy for many years. I recommend she have colonoscopy for new baseline. I also discussed ColoGuard stool test first. She will discuss this with her PCP.    PLAN: -Continue Tamoxifen at 10mg  daily, refilled today  -Diagnostic Mammogram in 03/2021 (she will call her insurance to see if it's covered, if not, will change to screening mammogram) -DEXA on 03/16/21 scheduled  -Lab and F/u in 6 months, will order screening breast MRI on next visit    No problem-specific Assessment & Plan notes found for this encounter.   Orders Placed This Encounter  Procedures  . MM DIAG BREAST TOMO BILATERAL    Standing Status:   Future    Standing Expiration Date:   01/02/2022    Order Specific Question:   Reason for Exam (SYMPTOM  OR DIAGNOSIS REQUIRED)    Answer:   screening    Order Specific Question:   Preferred imaging location?    Answer:   [facility]   All questions were answered. The patient knows to call the clinic with any problems, questions or concerns. No barriers to learning was detected. The total time spent in the appointment was 30 minutes.     Truitt Merle, MD 01/02/2021   I, Joslyn Devon, am acting as scribe for Truitt Merle, MD.   I have reviewed the above documentation for accuracy and completeness, and I agree with the above.

## 2021-01-02 ENCOUNTER — Inpatient Hospital Stay: Payer: Medicare HMO | Attending: Hematology | Admitting: Hematology

## 2021-01-02 ENCOUNTER — Telehealth: Payer: Self-pay | Admitting: Hematology

## 2021-01-02 ENCOUNTER — Other Ambulatory Visit: Payer: Self-pay

## 2021-01-02 ENCOUNTER — Encounter: Payer: Self-pay | Admitting: Hematology

## 2021-01-02 ENCOUNTER — Inpatient Hospital Stay: Payer: Medicare HMO

## 2021-01-02 VITALS — BP 174/79 | HR 73 | Temp 97.6°F | Ht 60.0 in | Wt 142.8 lb

## 2021-01-02 DIAGNOSIS — M8580 Other specified disorders of bone density and structure, unspecified site: Secondary | ICD-10-CM | POA: Insufficient documentation

## 2021-01-02 DIAGNOSIS — K219 Gastro-esophageal reflux disease without esophagitis: Secondary | ICD-10-CM | POA: Insufficient documentation

## 2021-01-02 DIAGNOSIS — D0501 Lobular carcinoma in situ of right breast: Secondary | ICD-10-CM | POA: Diagnosis not present

## 2021-01-02 DIAGNOSIS — I10 Essential (primary) hypertension: Secondary | ICD-10-CM | POA: Diagnosis not present

## 2021-01-02 LAB — CBC WITH DIFFERENTIAL (CANCER CENTER ONLY)
Abs Immature Granulocytes: 0.02 10*3/uL (ref 0.00–0.07)
Basophils Absolute: 0 10*3/uL (ref 0.0–0.1)
Basophils Relative: 1 %
Eosinophils Absolute: 0.2 10*3/uL (ref 0.0–0.5)
Eosinophils Relative: 3 %
HCT: 37.4 % (ref 36.0–46.0)
Hemoglobin: 11.9 g/dL — ABNORMAL LOW (ref 12.0–15.0)
Immature Granulocytes: 0 %
Lymphocytes Relative: 32 %
Lymphs Abs: 2.8 10*3/uL (ref 0.7–4.0)
MCH: 25.9 pg — ABNORMAL LOW (ref 26.0–34.0)
MCHC: 31.8 g/dL (ref 30.0–36.0)
MCV: 81.5 fL (ref 80.0–100.0)
Monocytes Absolute: 0.6 10*3/uL (ref 0.1–1.0)
Monocytes Relative: 6 %
Neutro Abs: 5.1 10*3/uL (ref 1.7–7.7)
Neutrophils Relative %: 59 %
Platelet Count: 182 10*3/uL (ref 150–400)
RBC: 4.59 MIL/uL (ref 3.87–5.11)
RDW: 14 % (ref 11.5–15.5)
WBC Count: 8.8 10*3/uL (ref 4.0–10.5)
nRBC: 0 % (ref 0.0–0.2)

## 2021-01-02 LAB — CMP (CANCER CENTER ONLY)
ALT: 17 U/L (ref 0–44)
AST: 20 U/L (ref 15–41)
Albumin: 3.6 g/dL (ref 3.5–5.0)
Alkaline Phosphatase: 34 U/L — ABNORMAL LOW (ref 38–126)
Anion gap: 7 (ref 5–15)
BUN: 15 mg/dL (ref 8–23)
CO2: 29 mmol/L (ref 22–32)
Calcium: 9 mg/dL (ref 8.9–10.3)
Chloride: 105 mmol/L (ref 98–111)
Creatinine: 0.91 mg/dL (ref 0.44–1.00)
GFR, Estimated: >60 mL/min (ref >60)
Glucose, Bld: 125 mg/dL — ABNORMAL HIGH (ref 70–99)
Potassium: 4 mmol/L (ref 3.5–5.1)
Sodium: 141 mmol/L (ref 135–145)
Total Bilirubin: 0.3 mg/dL (ref 0.3–1.2)
Total Protein: 6.5 g/dL (ref 6.5–8.1)

## 2021-01-02 MED ORDER — TAMOXIFEN CITRATE 10 MG PO TABS: 10.0000 mg | ORAL_TABLET | Freq: Every day | ORAL | 1 refills | Status: DC

## 2021-01-02 NOTE — Telephone Encounter (Signed)
Scheduled per los. Gave avs and calendar  

## 2021-02-15 DIAGNOSIS — H524 Presbyopia: Secondary | ICD-10-CM | POA: Diagnosis not present

## 2021-02-15 DIAGNOSIS — H52223 Regular astigmatism, bilateral: Secondary | ICD-10-CM | POA: Diagnosis not present

## 2021-02-15 DIAGNOSIS — H5203 Hypermetropia, bilateral: Secondary | ICD-10-CM | POA: Diagnosis not present

## 2021-02-22 NOTE — Progress Notes (Incomplete)
[place]   [phone] Fax:([phone]) [phone]   Clinic Follow up Note   Patient Care Team: Wenda Low, MD as PCP - General (Internal Medicine) Stark Klein, MD as Consulting Physician (General Surgery)   I connected with [place] on 02/22/2021 at 10:40 AM EDT by {Blank single:19197::"video enabled telemedicine visit","telephone visit"} and verified that I am speaking with the correct person using two identifiers.  I discussed the limitations, risks, security and privacy concerns of performing an evaluation and management service by telephone and the availability of in person appointments. I also discussed with the patient that there may be a patient responsible charge related to this service. The patient expressed understanding and agreed to proceed.   Other persons participating in the visit and their role in the encounter:  ***  Patient's location:  *** Provider's location:  ***  CHIEF COMPLAINT: F/u of recurrent right breast LCIS  SUMMARY OF ONCOLOGIC HISTORY: Oncology History Overview Note  Cancer Staging Breast neoplasm LCIS (right) Staging form: Breast, AJCC 7th Edition - Clinical: Stage Unknown (Tis (LCIS), NX, cM0) - Signed by Gordy Levan, MD on 07/12/2014 - Pathologic: No stage assigned - Unsigned    Neoplasm of right breast, 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 This measures 2.1 x 0.7 x 2.2 cm.  At the  distal end of this hypoechoic area, there is another hypoechoic  area that may contain calcifications, 6 cm from the right nipple at  11 o'clock measuring 9 x 4 x 6 mm.  Findings are concerning for  possible carcinoma.    03/10/2013 Initial Biopsy   Diagnosis Breast, right, needle core biopsy, 9:30,  8cm/nipple - LOBULAR CARCINOMA IN SITU WITH NECROSIS AND CALCIFICATION, SEE COMMENT. Microscopic Comment There is extensive lobular carcinoma in situ, pleomorphic type, present with associated necrosis and calcification. The presence of the myoepithelial layer was confirmed with p63, smooth muscle mycin heavy chain, and calponin immunostains. The case was reviewed with Dr. Gari Crown who concurs. (CRR:caf 03/12/13)   05/11/2013 Surgery   RIGHT BREAST NEEDLE LOCALIZATION  LUMPECTOMY by Dr Barry Dienes    05/11/2013 Pathology Results   Diagnosis Breast, lumpectomy, Right - LOBULAR CARCINOMA IN SITU WITH CALCIFICATIONS, PARTIALLY INVOLVING AN INTRADUCTAL PAPILLOMA. - FIBROCYSTIC CHANGES WITH CALCIFICATIONS. - HEALING BIOPSY SITE. - SEE COMMENT. Microscopic Comment Immunohistochemical stains for smooth muscle myosin, calponin, p63 and cytokeratin AE1/AE3 fail to highlight the presence of invasive carcinoma. A cytokeratin 5/6 stain is negative for the presence of atypical ductal hyperplasia. The surgical resection margin(s) of the specimen were inked and microscopically evaluated. (JBK:caf 05/14/13)   07/18/2013 Imaging   DEXA  Osteopenia with Lowest T-score -1.7 at [facility]    06/2013 - 03/2016 Anti-estrogen oral therapy   She tried Tamoxifen and Exemestane. She was Intolerant to tamoxifen initially. Cumulative stiffness/ arthralgias from aromasin already resolved. She stopped on her own.    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 This patient is considered osteopenic according to [facility]) criteria. There has been a statistically significant increase in BMD of Lumbar spine  and no statistically significant change in left hip since prior exam dated 07/28/2013.   05/09/2020  Mammogram   There are developing grouped calcifications in the anterior third of the upper inner quadrant of the right breast spanning an area of 5 mm. They are indeterminate.    05/13/2020 Relapse/Recurrence   Diagnosis Breast, right, needle core biopsy, UIQ - MAMMARY CARCINOMA IN-SITU WITH NECROSIS AND CALCIFICATIONS - SEE COMMENT Microscopic Comment There is a focus concerning but not definitive for microinvasion. Based on the biopsy, the carcinoma in situ has a pleomorphic pattern, high nuclear grade and measures 0.4 cm in greatest linear extent. E-cadherin is pending and will be reported in an addendum. Dr. Jeannie Done reviewed the case and agrees with the above diagnosis. These results were called to [facility] on May 16, 2020.   08/02/2020 Surgery   RIGHT BREAST LUMPECTOMY WITH RADIOACTIVE SEED LOCALIZATION by Dr Barry Dienes    08/02/2020 Pathology Results   FINAL MICROSCOPIC DIAGNOSIS:   A. BREAST, RIGHT, LUMPECTOMY:  - Biopsy site.  Lobular carcinoma in situ.  Usual duct epithelial  hyperplasia.  Fibrocystic change.   B. BREAST, RIGHT, ADDITIONAL POSTERIOR MARGIN, EXCISION:  - Usual duct epithelial hyperplasia.  Fibrocystic change.   COMMENT:   A. The lobular carcinoma in situ observed is not of the pleomorphic  type.   B. E-cadherin and CK 5/6 are positive in usual duct epithelial  hyperplasia.    08/2020 -  Anti-estrogen oral therapy   Tamoxifen 10mg  once daily starting in 08/2020      CURRENT THERAPY:  Tamoxifen 10mg  once daily starting in 08/2020  INTERVAL HISTORY: *** Ashley Pratt presents for a virtual follow up. {They identified themselves by birth date/face to face video.}    REVIEW OF SYSTEMS:  *** Constitutional: Denies fevers, chills or abnormal weight loss Eyes: Denies blurriness of vision Ears, nose, mouth, throat, and face: Denies mucositis or sore throat Respiratory: Denies cough, dyspnea or wheezes Cardiovascular:  Denies palpitation, chest discomfort or lower extremity swelling Gastrointestinal:  Denies nausea, heartburn or change in bowel habits Skin: Denies abnormal skin rashes Lymphatics: Denies new lymphadenopathy or easy bruising Neurological:Denies numbness, tingling or new weaknesses Behavioral/Psych: Mood is stable, no new changes  All other systems were reviewed with the patient and are negative.  MEDICAL HISTORY:  Past Medical History:  Diagnosis Date  . Arthritis   . Chronic back pain   . Colon polyps   . GERD (gastroesophageal reflux disease)   . Heart murmur   . Hypertension   . Malignant neoplasm of right female breast ([place])    unspecified site of breast  . 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 Prediabetes     SURGICAL HISTORY: Past Surgical History:  Procedure Laterality Date  . BREAST EXCISIONAL BIOPSY Right 2014  . BREAST LUMPECTOMY Right 05/11/2013   high risk lumpectomy  . BREAST LUMPECTOMY WITH NEEDLE LOCALIZATION Right 05/11/2013   Procedure: RIGHT BREAST NEEDLE LOCALIZATION  LUMPECTOMY;  Surgeon: Stark Klein, MD;  Location: [place];  Service: General;  Laterality: Right;  . BREAST LUMPECTOMY WITH RADIOACTIVE SEED LOCALIZATION Right 08/02/2020   Procedure: RIGHT BREAST LUMPECTOMY WITH RADIOACTIVE SEED LOCALIZATION;  Surgeon: Stark Klein, MD;  Location: [place];  Service: General;  Laterality: Right;  RNFA  . CHOLECYSTECTOMY    . COLONOSCOPY    . FOOT OSTEOTOMY     both  feet  . TONSILLECTOMY  I have reviewed the social history and family history with the patient and they are unchanged from previous note.  ALLERGIES:  is allergic to aspirin and penicillins.  MEDICATIONS:  Current Outpatient Medications  Medication Sig Dispense Refill  . losartan (COZAAR) 50 MG tablet Take 1 tablet by mouth daily.  1  . Multiple Vitamin (MULTIVITAMIN) capsule Take 1 capsule by mouth daily.    . tamoxifen (NOLVADEX) 10  MG tablet Take 1 tablet (10 mg total) by mouth daily. 90 tablet 1   No current facility-administered medications for this visit.    PHYSICAL EXAMINATION: ECOG PERFORMANCE STATUS: {CHL ONC ECOG PS:[id number]}  No vitals taken today, Exam not performed today   LABORATORY DATA:  I have reviewed the data as listed CBC Latest Ref Rng & Units 01/02/2021 10/17/2015 03/07/2015  WBC 4.0 - 10.5 K/uL 8.8 9.0 8.4  Hemoglobin 12.0 - 15.0 g/dL 11.9(L) 12.8 12.5  Hematocrit 36.0 - 46.0 % 37.4 40.8 38.2  Platelets 150 - 400 K/uL 182 212 200     CMP Latest Ref Rng & Units 01/02/2021 04/10/2016 10/17/2015  Glucose 70 - 99 mg/dL 125(H) 88 120  BUN 8 - 23 mg/dL 15 13 14.5  Creatinine 0.44 - 1.00 mg/dL 0.91 0.87 0.9  Sodium 135 - 145 mmol/L 141 136 139  Potassium 3.5 - 5.1 mmol/L 4.0 4.3 4.1  Chloride 98 - 111 mmol/L 105 98 -  CO2 22 - 32 mmol/L 29 26 28   Calcium 8.9 - 10.3 mg/dL 9.0 9.5 10.1  Total Protein 6.5 - 8.1 g/dL 6.5 - 7.5  Total Bilirubin 0.3 - 1.2 mg/dL 0.3 - 0.33  Alkaline Phos 38 - 126 U/L 34(L) - 103  AST 15 - 41 U/L 20 - 24  ALT 0 - 44 U/L 17 - 32      RADIOGRAPHIC STUDIES: I have personally reviewed the radiological images as listed and agreed with the findings in the report. No results found.   ASSESSMENT & PLAN:  Akari Defelice is a 70 y.o. female with    1.RightbreastLCIS,Highgradewith necrosis,pleomorphic,recurrence in 04/2020  -She was initially diagnosedwith LCISin 02/2013. She was treated with right lumpectomy with Dr Miles Costain about 3 years of antiestrogen therapy (exemestane and Tamoxifen), which she stopped on her own due to poor tolerance. -She has recurrence in based on 04/19/20 Mammogram and 04/2020 biopsy with high grade 42mm of LCIS in her UIQ of right breast with necrosis and calcification, and multifocal foci. -She was treated with Right lumpectomy with Dr Barry Dienes on 08/02/20. HerLCIS was cured by complete surgical resection. Any form of adjuvant  therapy is preventive.  -To reduce her risk of future breast cancer, I started her on Antiestrogen therapy withlow dose10mg  Tamoxifen daily for 5 years. She is tolerating well except her increasing BP. I advised her to increase HTN medication with her PCP for better management first.  -She is clinically doing well. She will have occasional right breast pain from her nerve damage. Labs reviewed, CBC and CMP WNL except Hg 11.9. Physical Exam benign. There is no clinical concern for recurrence.  -Continue surveillance. Next mammogram in 03/2021. I discussed the option of additional screening with annual breast MRIs. I also discussed Abbreviated MRIs which have $400 out-of-pocket cost if insurance does not cover this. She is interested, plan to start MRI in 09/2021.  -F/u in 6 months    2. HTN, GERD, Arthritis, Heartmurmur  -Only on Losartan and controlled.  -She will continue to f/u with PCP and cardiologist -  Pt attributed 10mg  Tamoxifen to increasing her HTN. She notes this has reached 160 range. Today her BP is 174/79 (01/02/21). I discussed increasing Losartan with her PCP to better control her HTN.    3. Osteopenia -Her 02/2016 DEXA showed osteopenia with T-score -1.5.  -She plans to repeat on 03/16/21  4. Cancer Screening  -She has not had colonoscopy for many years. I recommend she have colonoscopy for new baseline. I also discussed ColoGuard stool test first. She will discuss this with her PCP.    PLAN: -Continue Tamoxifen at 10mg  daily, refilled today  -Diagnostic Mammogram in 03/2021 (she will call her insurance to see if it's covered, if not, will change to screening mammogram) -DEXA on 03/16/21 scheduled  -Lab and F/u in 6 months, will order screening breast MRI on next visit     No problem-specific Assessment & Plan notes found for this encounter.   No orders of the defined types were placed in this encounter.  I discussed the assessment and treatment plan with the  patient. The patient was provided an opportunity to ask questions and all were answered. The patient agreed with the plan and demonstrated an understanding of the instructions.  The patient was advised to call back or seek an in-person evaluation if the symptoms worsen or if the condition fails to improve as anticipated.  The total time spent in the appointment was {CHL ONC TIME VISIT - VOJJK:[id number]}.    Ashley Pratt 02/22/2021   Oneal Deputy, am acting as scribe for Truitt Merle, MD.   {Add scribe attestation statement}

## 2021-02-23 ENCOUNTER — Telehealth: Payer: Self-pay | Admitting: Hematology

## 2021-02-23 NOTE — Telephone Encounter (Signed)
R/s appts per 4/28 sch msg. Pt's husband is aware.

## 2021-02-27 ENCOUNTER — Inpatient Hospital Stay: Payer: Medicare HMO | Admitting: Hematology

## 2021-02-27 ENCOUNTER — Inpatient Hospital Stay: Payer: Medicare HMO

## 2021-03-07 DIAGNOSIS — I10 Essential (primary) hypertension: Secondary | ICD-10-CM | POA: Diagnosis not present

## 2021-03-07 DIAGNOSIS — C50911 Malignant neoplasm of unspecified site of right female breast: Secondary | ICD-10-CM | POA: Diagnosis not present

## 2021-03-07 DIAGNOSIS — M5136 Other intervertebral disc degeneration, lumbar region: Secondary | ICD-10-CM | POA: Diagnosis not present

## 2021-03-07 DIAGNOSIS — M8580 Other specified disorders of bone density and structure, unspecified site: Secondary | ICD-10-CM | POA: Diagnosis not present

## 2021-03-07 DIAGNOSIS — R7303 Prediabetes: Secondary | ICD-10-CM | POA: Diagnosis not present

## 2021-03-16 ENCOUNTER — Ambulatory Visit
Admission: RE | Admit: 2021-03-16 | Discharge: 2021-03-16 | Disposition: A | Discharge status: Home / Self Care | Payer: Medicare HMO | Source: Ambulatory Visit | Attending: Internal Medicine | Admitting: Internal Medicine

## 2021-03-16 ENCOUNTER — Other Ambulatory Visit: Payer: Self-pay

## 2021-03-16 DIAGNOSIS — M8589 Other specified disorders of bone density and structure, multiple sites: Secondary | ICD-10-CM | POA: Diagnosis not present

## 2021-03-16 DIAGNOSIS — M8580 Other specified disorders of bone density and structure, unspecified site: Secondary | ICD-10-CM

## 2021-03-16 DIAGNOSIS — Z780 Asymptomatic menopausal state: Secondary | ICD-10-CM | POA: Diagnosis not present

## 2021-03-18 DIAGNOSIS — Z0100 Encounter for examination of eyes and vision without abnormal findings: Secondary | ICD-10-CM | POA: Diagnosis not present

## 2021-03-31 NOTE — Progress Notes (Signed)
[place]   Telephone:([phone]) [phone] Fax:(336) [phone]   Clinic Follow up Note   Patient Care Team: Wenda Low, MD as PCP - General (Internal Medicine) Stark Klein, MD as Consulting Physician (General Surgery)  Date of Service:  04/05/2021  CHIEF COMPLAINT: F/u of recurrent right breast LCIS  SUMMARY OF ONCOLOGIC HISTORY: Oncology History Overview Note  Cancer Staging Breast neoplasm LCIS (right) Staging form: Breast, AJCC 7th Edition - Clinical: Stage Unknown (Tis (LCIS), NX, cM0) - Signed by Gordy Levan, MD on 07/12/2014 - Pathologic: No stage assigned - Unsigned    Neoplasm of right breast, 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 This measures 2.1 x 0.7 x 2.2 cm.  At the  distal end of this hypoechoic area, there is another hypoechoic  area that may contain calcifications, 6 cm from the right nipple at  11 o'clock measuring 9 x 4 x 6 mm.  Findings are concerning for  possible carcinoma.    03/10/2013 Initial Biopsy   Diagnosis Breast, right, needle core biopsy, 9:30, 8cm/nipple - LOBULAR CARCINOMA IN SITU WITH NECROSIS AND CALCIFICATION, SEE COMMENT. Microscopic Comment There is extensive lobular carcinoma in situ, pleomorphic type, present with associated necrosis and calcification. The presence of the myoepithelial layer was confirmed with p63, smooth muscle mycin heavy chain, and calponin immunostains. The case was reviewed with Dr. Gari Crown who concurs. (CRR:caf 03/12/13)   05/11/2013 Surgery   RIGHT BREAST NEEDLE LOCALIZATION  LUMPECTOMY by Dr Barry Dienes    05/11/2013 Pathology Results   Diagnosis Breast, lumpectomy, Right - LOBULAR CARCINOMA IN SITU WITH CALCIFICATIONS, PARTIALLY INVOLVING AN INTRADUCTAL PAPILLOMA. - FIBROCYSTIC CHANGES  WITH CALCIFICATIONS. - HEALING BIOPSY SITE. - SEE COMMENT. Microscopic Comment Immunohistochemical stains for smooth muscle myosin, calponin, p63 and cytokeratin AE1/AE3 fail to highlight the presence of invasive carcinoma. A cytokeratin 5/6 stain is negative for the presence of atypical ductal hyperplasia. The surgical resection margin(s) of the specimen were inked and microscopically evaluated. (JBK:caf 05/14/13)   07/18/2013 Imaging   DEXA  Osteopenia with Lowest T-score -1.7 at [facility]    06/2013 - 03/2016 Anti-estrogen oral therapy   She tried Tamoxifen and Exemestane. She was Intolerant to tamoxifen initially. Cumulative stiffness/ arthralgias from aromasin already resolved. She stopped on her own.    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 This patient is considered osteopenic according to [facility]) criteria. There has been a statistically significant increase in BMD of Lumbar spine and no statistically significant change in left hip since prior exam dated 07/28/2013.   05/09/2020 Mammogram   There are developing grouped calcifications in the anterior third of the upper inner quadrant of the right breast spanning an area of 5 mm. They are indeterminate.    05/13/2020 Relapse/Recurrence   Diagnosis Breast, right, needle core biopsy, UIQ - MAMMARY CARCINOMA IN-SITU WITH NECROSIS AND CALCIFICATIONS - SEE COMMENT Microscopic Comment There is a focus concerning but not definitive for microinvasion. Based on the biopsy, the carcinoma in situ has a pleomorphic pattern, high nuclear grade and measures 0.4 cm in greatest linear  extent. E-cadherin is pending and will be reported in an addendum. Dr. Jeannie Done reviewed the case and agrees with the above diagnosis.  These results were called to [facility] on May 16, 2020.   08/02/2020 Surgery   RIGHT BREAST LUMPECTOMY WITH RADIOACTIVE SEED LOCALIZATION by Dr Barry Dienes    08/02/2020 Pathology Results   FINAL MICROSCOPIC DIAGNOSIS:   A. BREAST, RIGHT, LUMPECTOMY:  - Biopsy site.  Lobular carcinoma in situ.  Usual duct epithelial  hyperplasia.  Fibrocystic change.   B. BREAST, RIGHT, ADDITIONAL POSTERIOR MARGIN, EXCISION:  - Usual duct epithelial hyperplasia.  Fibrocystic change.   COMMENT:   A. The lobular carcinoma in situ observed is not of the pleomorphic  type.   B. E-cadherin and CK 5/6 are positive in usual duct epithelial  hyperplasia.    08/2020 -  Anti-estrogen oral therapy   Tamoxifen 10mg  once daily starting in 08/2020      CURRENT THERAPY:  Tamoxifen 10mg  once daily starting in 08/2020  INTERVAL HISTORY:  Ashley Pratt is here for a follow up of recurrent left breast LCIS. She was last seen by me 3 months ago. She presents to the clinic with her husband. She notes she is doing well and stable. She notes she is tolerating Tamoxifen well. She denies hot flashes. She notes occasional mild pain at right breast incision from surgery. She notes she is more interested in MRI than mammogram given pain from mammogram.     REVIEW OF SYSTEMS:   Constitutional: Denies fevers, chills or abnormal weight loss Eyes: Denies blurriness of vision Ears, nose, mouth, throat, and face: Denies mucositis or sore throat Respiratory: Denies cough, dyspnea or wheezes Cardiovascular: Denies palpitation, chest discomfort or lower extremity swelling Gastrointestinal:  Denies nausea, heartburn or change in bowel habits Skin: Denies abnormal skin rashes Lymphatics: Denies new lymphadenopathy or easy bruising Neurological:Denies numbness, tingling or new weaknesses Behavioral/Psych: Mood is stable, no new changes  All other systems were reviewed with the patient and are  negative.  MEDICAL HISTORY:  Past Medical History:  Diagnosis Date  . Arthritis   . Chronic back pain   . Colon polyps   . GERD (gastroesophageal reflux disease)   . Heart murmur   . Hypertension   . Malignant neoplasm of right female breast ([place])    unspecified site of breast  . 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 Prediabetes     SURGICAL HISTORY: Past Surgical History:  Procedure Laterality Date  . BREAST EXCISIONAL BIOPSY Right 2014  . BREAST LUMPECTOMY Right 05/11/2013   high risk lumpectomy  . BREAST LUMPECTOMY WITH NEEDLE LOCALIZATION Right 05/11/2013   Procedure: RIGHT BREAST NEEDLE LOCALIZATION  LUMPECTOMY;  Surgeon: Stark Klein, MD;  Location: [facility];  Service: General;  Laterality: Right;  . BREAST LUMPECTOMY WITH RADIOACTIVE SEED LOCALIZATION Right 08/02/2020   Procedure: RIGHT BREAST LUMPECTOMY WITH RADIOACTIVE SEED LOCALIZATION;  Surgeon: Stark Klein, MD;  Location: [place] [facility];  Service: General;  Laterality: Right;  RNFA  . CHOLECYSTECTOMY    . COLONOSCOPY    . FOOT OSTEOTOMY     both  feet  . TONSILLECTOMY      I have reviewed the social history and family history with the patient and they are unchanged from previous note.  ALLERGIES:  is allergic to aspirin and penicillins.  MEDICATIONS:  Current Outpatient Medications  Medication Sig Dispense Refill  . losartan (COZAAR) 50 MG  tablet Take 1 tablet by mouth daily.  1  . Multiple Vitamin (MULTIVITAMIN) capsule Take 1 capsule by mouth daily.    . tamoxifen (NOLVADEX) 10 MG tablet Take 1 tablet (10 mg total) by mouth daily. 90 tablet 1   No current facility-administered medications for this visit.    PHYSICAL EXAMINATION: ECOG PERFORMANCE STATUS: 0 - Asymptomatic  Vitals:   04/05/21 1419  BP: (!) 158/63  Pulse: 66  Resp: 19  Temp: 98.4 F (36.9 C)  SpO2: 100%   Filed Weights   04/05/21 1419  Weight: 145 lb 9.6 oz (66 kg)     GENERAL:alert, no distress and comfortable SKIN: skin color, texture, turgor are normal, no rashes or significant lesions EYES: normal, Conjunctiva are pink and non-injected, sclera clear  NECK: supple, thyroid normal size, non-tender, without nodularity LYMPH:  no palpable lymphadenopathy in the cervical, axillary  LUNGS: clear to auscultation and percussion with normal breathing effort HEART: regular rate & rhythm and no murmurs and no lower extremity edema ABDOMEN:abdomen soft, non-tender and normal bowel sounds Musculoskeletal:no cyanosis of digits and no clubbing  NEURO: alert & oriented x 3 with fluent speech, no focal motor/sensory deficits BREAST: S/p right lumpectomy: surgical incision healed well. No palpable mass, nodules or adenopathy bilaterally. Breast exam benign.   LABORATORY DATA:  I have reviewed the data as listed CBC Latest Ref Rng & Units 04/05/2021 01/02/2021 10/17/2015  WBC 4.0 - 10.5 K/uL 7.9 8.8 9.0  Hemoglobin 12.0 - 15.0 g/dL 11.6(L) 11.9(L) 12.8  Hematocrit 36.0 - 46.0 % 36.7 37.4 40.8  Platelets 150 - 400 K/uL 178 182 212     CMP Latest Ref Rng & Units 04/05/2021 01/02/2021 04/10/2016  Glucose 70 - 99 mg/dL 105(H) 125(H) 88  BUN 8 - 23 mg/dL 17 15 13   Creatinine 0.44 - 1.00 mg/dL 0.86 0.91 0.87  Sodium 135 - 145 mmol/L 141 141 136  Potassium 3.5 - 5.1 mmol/L 4.3 4.0 4.3  Chloride 98 - 111 mmol/L 106 105 98  CO2 22 - 32 mmol/L 26 29 26   Calcium 8.9 - 10.3 mg/dL 8.7(L) 9.0 9.5  Total Protein 6.5 - 8.1 g/dL 6.5 6.5 -  Total Bilirubin 0.3 - 1.2 mg/dL 0.2(L) 0.3 -  Alkaline Phos 38 - 126 U/L 36(L) 34(L) -  AST 15 - 41 U/L 22 20 -  ALT 0 - 44 U/L 20 17 -      RADIOGRAPHIC STUDIES: I have personally reviewed the radiological images as listed and agreed with the findings in the report. No results found.   ASSESSMENT & PLAN:  Ashley Pratt is a 70 y.o. female with   1.RightbreastLCIS,Highgradewith necrosis,pleomorphic,recurrence in  04/2020  -She was initially diagnosedwith LCISin 02/2013. She was treated with right lumpectomy with Dr Miles Costain about 3 years of antiestrogen therapy (exemestane and Tamoxifen), which she stopped on her own due to poor tolerance. -She has recurrence based on 04/19/20 Mammogram and 04/2020 biopsy with high grade 34mm of LCIS in her UIQ of right breast with necrosis and calcification, and multifocal foci. -She was treated with Right lumpectomy with Dr Barry Dienes on 08/02/20. HerLCIS was cured by complete surgical resection. Any form of adjuvant therapy is preventive.  -To reduce her risk of future breast cancer, I started her on Antiestrogen therapy withlow dose 10mg  Tamoxifen daily for 5 years. She is tolerating well.  -She is clinically doing well and stable. Lab reviewed, her CBC and CMP are within normal limits except Hg 11.6. Her physical exam  was unremarkable. There is no clinical concern for recurrence. -She is hesitant for mammogram given it cause breast pain. I discussed this is standard for screening for breast cancer. She can also do additional screening with MRI breast in between. She is willing to proceed with Mammogram on 04/19/21. Will is also willing to proceed with MRI in 09/2021.  -Continue Surveillance and Tamoxifen.  -F/u in 6 months.   2. HTN, GERD, Arthritis, Heartmurmur  -Only on Losartan and controlled.  -She will continue to f/u with PCP and cardiologist -Pt attributed 10mg  Tamoxifen to increasing her HTN.  -BP 158/63 today (04/05/21). Improved.   3. Anemia -She has had mild anemia since being on Tamoxifen. Hg was normal 6 years ago  -I will check her iron panel. I recommend she start prenatal vitamin, she is agreeable. I will also check her old labs records from her PCP, Dr Lysle Rubens.  -I recommend colonoscopy   4. Osteopenia -Her 02/2016 DEXA showed osteopenia with T-score -1.5. Her 03/16/21 DEXA shows T-score -1.7 at [facility]. Mostly stable. Will monitor and repeat in 2-3  years.  -I recommend she take OTC Calcium and Vit D. I discussed Tamoxifen will also help strengthen her bone.   5. Cancer Screening  -Her last colonoscopy was about 01 years ago. I discussed repeating to stay up to date on her cancer screenings. She is willing to return to Dr. Collene Mares, I encouraged her to proceed.    PLAN: -Continue Tamoxifen at 10mg  daily, refilled today  -Mammogram on 04/19/21 scheduled  -MRI breast in 09/2021. Will order at next visit.  -Lab and F/u in 6 months  -will get her old lab from PCP, copy Dr. Collene Mares, she is due for colonoscopy and she has mild anemia    No problem-specific Assessment & Plan notes found for this encounter.   Orders Placed This Encounter  Procedures  . Ferritin    Standing Status:   Future    Standing Expiration Date:   04/05/2022  . Iron and TIBC    Standing Status:   Future    Standing Expiration Date:   04/05/2022   All questions were answered. The patient knows to call the clinic with any problems, questions or concerns. No barriers to learning was detected. The total time spent in the appointment was 30 minutes.     Truitt Merle, MD 04/05/2021   I, Joslyn Devon, am acting as scribe for Truitt Merle, MD.   I have reviewed the above documentation for accuracy and completeness, and I agree with the above.

## 2021-04-05 ENCOUNTER — Other Ambulatory Visit: Payer: Self-pay

## 2021-04-05 ENCOUNTER — Encounter: Payer: Self-pay | Admitting: Hematology

## 2021-04-05 ENCOUNTER — Inpatient Hospital Stay: Payer: Medicare HMO | Attending: Hematology

## 2021-04-05 ENCOUNTER — Ambulatory Visit: Payer: Medicare HMO | Admitting: Hematology

## 2021-04-05 ENCOUNTER — Inpatient Hospital Stay: Payer: Medicare HMO | Admitting: Hematology

## 2021-04-05 VITALS — BP 158/63 | HR 66 | Temp 98.4°F | Resp 19 | Ht 60.0 in | Wt 145.6 lb

## 2021-04-05 DIAGNOSIS — D0501 Lobular carcinoma in situ of right breast: Secondary | ICD-10-CM

## 2021-04-05 DIAGNOSIS — K219 Gastro-esophageal reflux disease without esophagitis: Secondary | ICD-10-CM | POA: Diagnosis not present

## 2021-04-05 DIAGNOSIS — M8580 Other specified disorders of bone density and structure, unspecified site: Secondary | ICD-10-CM | POA: Insufficient documentation

## 2021-04-05 DIAGNOSIS — D649 Anemia, unspecified: Secondary | ICD-10-CM

## 2021-04-05 DIAGNOSIS — I10 Essential (primary) hypertension: Secondary | ICD-10-CM | POA: Insufficient documentation

## 2021-04-05 DIAGNOSIS — M1990 Unspecified osteoarthritis, unspecified site: Secondary | ICD-10-CM | POA: Diagnosis not present

## 2021-04-05 DIAGNOSIS — Z79810 Long term (current) use of selective estrogen receptor modulators (SERMs): Secondary | ICD-10-CM | POA: Diagnosis not present

## 2021-04-05 DIAGNOSIS — Z79899 Other long term (current) drug therapy: Secondary | ICD-10-CM | POA: Insufficient documentation

## 2021-04-05 LAB — CBC WITH DIFFERENTIAL (CANCER CENTER ONLY)
Abs Immature Granulocytes: 0 10*3/uL (ref 0.00–0.07)
Basophils Absolute: 0 10*3/uL (ref 0.0–0.1)
Basophils Relative: 0 %
Eosinophils Absolute: 0.3 10*3/uL (ref 0.0–0.5)
Eosinophils Relative: 4 %
HCT: 36.7 % (ref 36.0–46.0)
Hemoglobin: 11.6 g/dL — ABNORMAL LOW (ref 12.0–15.0)
Immature Granulocytes: 0 %
Lymphocytes Relative: 43 %
Lymphs Abs: 3.4 10*3/uL (ref 0.7–4.0)
MCH: 25.7 pg — ABNORMAL LOW (ref 26.0–34.0)
MCHC: 31.6 g/dL (ref 30.0–36.0)
MCV: 81.4 fL (ref 80.0–100.0)
Monocytes Absolute: 0.5 10*3/uL (ref 0.1–1.0)
Monocytes Relative: 7 %
Neutro Abs: 3.6 10*3/uL (ref 1.7–7.7)
Neutrophils Relative %: 46 %
Platelet Count: 178 10*3/uL (ref 150–400)
RBC: 4.51 MIL/uL (ref 3.87–5.11)
RDW: 14.5 % (ref 11.5–15.5)
WBC Count: 7.9 10*3/uL (ref 4.0–10.5)
nRBC: 0 % (ref 0.0–0.2)

## 2021-04-05 LAB — CMP (CANCER CENTER ONLY)
ALT: 20 U/L (ref 0–44)
AST: 22 U/L (ref 15–41)
Albumin: 3.6 g/dL (ref 3.5–5.0)
Alkaline Phosphatase: 36 U/L — ABNORMAL LOW (ref 38–126)
Anion gap: 9 (ref 5–15)
BUN: 17 mg/dL (ref 8–23)
CO2: 26 mmol/L (ref 22–32)
Calcium: 8.7 mg/dL — ABNORMAL LOW (ref 8.9–10.3)
Chloride: 106 mmol/L (ref 98–111)
Creatinine: 0.86 mg/dL (ref 0.44–1.00)
GFR, Estimated: >60 mL/min (ref >60)
Glucose, Bld: 105 mg/dL — ABNORMAL HIGH (ref 70–99)
Potassium: 4.3 mmol/L (ref 3.5–5.1)
Sodium: 141 mmol/L (ref 135–145)
Total Bilirubin: 0.2 mg/dL — ABNORMAL LOW (ref 0.3–1.2)
Total Protein: 6.5 g/dL (ref 6.5–8.1)

## 2021-04-05 MED ORDER — TAMOXIFEN CITRATE 10 MG PO TABS
10.0000 mg | ORAL_TABLET | Freq: Every day | ORAL | 1 refills | Status: DC
Start: 2021-04-05 — End: 2021-09-06

## 2021-04-19 ENCOUNTER — Other Ambulatory Visit: Payer: Self-pay

## 2021-04-19 ENCOUNTER — Ambulatory Visit
Admission: RE | Admit: 2021-04-19 | Discharge: 2021-04-19 | Disposition: A | Discharge status: Home / Self Care | Payer: Medicare HMO | Source: Ambulatory Visit | Attending: Hematology | Admitting: Hematology

## 2021-04-19 DIAGNOSIS — R922 Inconclusive mammogram: Secondary | ICD-10-CM | POA: Diagnosis not present

## 2021-04-19 DIAGNOSIS — D0501 Lobular carcinoma in situ of right breast: Secondary | ICD-10-CM

## 2021-04-19 DIAGNOSIS — Z853 Personal history of malignant neoplasm of breast: Secondary | ICD-10-CM | POA: Diagnosis not present

## 2021-07-05 ENCOUNTER — Other Ambulatory Visit: Payer: Medicare HMO

## 2021-07-05 ENCOUNTER — Ambulatory Visit: Payer: Medicare HMO | Admitting: Hematology

## 2021-07-12 DIAGNOSIS — I519 Heart disease, unspecified: Secondary | ICD-10-CM | POA: Diagnosis not present

## 2021-07-12 DIAGNOSIS — Z0000 Encounter for general adult medical examination without abnormal findings: Secondary | ICD-10-CM | POA: Diagnosis not present

## 2021-07-12 DIAGNOSIS — M179 Osteoarthritis of knee, unspecified: Secondary | ICD-10-CM | POA: Diagnosis not present

## 2021-07-12 DIAGNOSIS — I10 Essential (primary) hypertension: Secondary | ICD-10-CM | POA: Diagnosis not present

## 2021-07-12 DIAGNOSIS — I359 Nonrheumatic aortic valve disorder, unspecified: Secondary | ICD-10-CM | POA: Diagnosis not present

## 2021-07-12 DIAGNOSIS — Z1389 Encounter for screening for other disorder: Secondary | ICD-10-CM | POA: Diagnosis not present

## 2021-07-12 DIAGNOSIS — M8580 Other specified disorders of bone density and structure, unspecified site: Secondary | ICD-10-CM | POA: Diagnosis not present

## 2021-07-12 DIAGNOSIS — R7303 Prediabetes: Secondary | ICD-10-CM | POA: Diagnosis not present

## 2021-07-12 DIAGNOSIS — C50911 Malignant neoplasm of unspecified site of right female breast: Secondary | ICD-10-CM | POA: Diagnosis not present

## 2021-07-12 DIAGNOSIS — R519 Headache, unspecified: Secondary | ICD-10-CM | POA: Diagnosis not present

## 2021-07-14 ENCOUNTER — Other Ambulatory Visit: Payer: Self-pay | Admitting: Internal Medicine

## 2021-07-14 DIAGNOSIS — R519 Headache, unspecified: Secondary | ICD-10-CM

## 2021-08-03 ENCOUNTER — Ambulatory Visit
Admission: RE | Admit: 2021-08-03 | Discharge: 2021-08-03 | Disposition: A | Discharge status: Home / Self Care | Payer: Medicare HMO | Source: Ambulatory Visit | Attending: Internal Medicine | Admitting: Internal Medicine

## 2021-08-03 ENCOUNTER — Other Ambulatory Visit: Payer: Self-pay

## 2021-08-03 DIAGNOSIS — I6782 Cerebral ischemia: Secondary | ICD-10-CM | POA: Diagnosis not present

## 2021-08-03 DIAGNOSIS — R519 Headache, unspecified: Secondary | ICD-10-CM

## 2021-08-03 MED ORDER — GADOBENATE DIMEGLUMINE 529 MG/ML IV SOLN
13.0000 mL | Freq: Once | INTRAVENOUS | Status: AC | PRN
  Administered 2021-08-03: 13 mL via INTRAVENOUS

## 2021-08-04 DIAGNOSIS — Z8249 Family history of ischemic heart disease and other diseases of the circulatory system: Secondary | ICD-10-CM | POA: Diagnosis not present

## 2021-08-04 DIAGNOSIS — K219 Gastro-esophageal reflux disease without esophagitis: Secondary | ICD-10-CM | POA: Diagnosis not present

## 2021-08-04 DIAGNOSIS — C50919 Malignant neoplasm of unspecified site of unspecified female breast: Secondary | ICD-10-CM | POA: Diagnosis not present

## 2021-08-04 DIAGNOSIS — I10 Essential (primary) hypertension: Secondary | ICD-10-CM | POA: Diagnosis not present

## 2021-08-04 DIAGNOSIS — Z823 Family history of stroke: Secondary | ICD-10-CM | POA: Diagnosis not present

## 2021-08-04 DIAGNOSIS — Z79810 Long term (current) use of selective estrogen receptor modulators (SERMs): Secondary | ICD-10-CM | POA: Diagnosis not present

## 2021-08-10 ENCOUNTER — Telehealth: Payer: Self-pay | Admitting: Hematology

## 2021-08-10 NOTE — Telephone Encounter (Signed)
Rescheduled per provider sch change, pt has been called and confirmed appt

## 2021-08-23 DIAGNOSIS — R252 Cramp and spasm: Secondary | ICD-10-CM | POA: Diagnosis not present

## 2021-08-23 DIAGNOSIS — M5416 Radiculopathy, lumbar region: Secondary | ICD-10-CM | POA: Diagnosis not present

## 2021-08-23 DIAGNOSIS — M79606 Pain in leg, unspecified: Secondary | ICD-10-CM | POA: Diagnosis not present

## 2021-09-05 ENCOUNTER — Other Ambulatory Visit: Payer: Self-pay | Admitting: Hematology

## 2021-09-06 NOTE — Telephone Encounter (Signed)
Tamoxifen refill ordered

## 2021-10-04 ENCOUNTER — Ambulatory Visit: Payer: Medicare HMO | Admitting: Hematology

## 2021-10-04 ENCOUNTER — Other Ambulatory Visit: Payer: Medicare HMO

## 2021-10-05 ENCOUNTER — Encounter: Payer: Self-pay | Admitting: Hematology

## 2021-10-05 ENCOUNTER — Other Ambulatory Visit: Payer: Self-pay

## 2021-10-05 ENCOUNTER — Inpatient Hospital Stay: Payer: Medicare HMO | Admitting: Hematology

## 2021-10-05 ENCOUNTER — Inpatient Hospital Stay: Payer: Medicare HMO | Attending: Hematology

## 2021-10-05 VITALS — BP 181/67 | HR 66 | Temp 98.9°F | Resp 17 | Ht 60.0 in | Wt 137.7 lb

## 2021-10-05 DIAGNOSIS — M8580 Other specified disorders of bone density and structure, unspecified site: Secondary | ICD-10-CM | POA: Diagnosis not present

## 2021-10-05 DIAGNOSIS — D0501 Lobular carcinoma in situ of right breast: Secondary | ICD-10-CM | POA: Diagnosis not present

## 2021-10-05 DIAGNOSIS — Z79810 Long term (current) use of selective estrogen receptor modulators (SERMs): Secondary | ICD-10-CM | POA: Insufficient documentation

## 2021-10-05 DIAGNOSIS — I10 Essential (primary) hypertension: Secondary | ICD-10-CM | POA: Diagnosis not present

## 2021-10-05 DIAGNOSIS — K219 Gastro-esophageal reflux disease without esophagitis: Secondary | ICD-10-CM | POA: Insufficient documentation

## 2021-10-05 DIAGNOSIS — M1990 Unspecified osteoarthritis, unspecified site: Secondary | ICD-10-CM | POA: Insufficient documentation

## 2021-10-05 DIAGNOSIS — D649 Anemia, unspecified: Secondary | ICD-10-CM | POA: Diagnosis not present

## 2021-10-05 DIAGNOSIS — Z79899 Other long term (current) drug therapy: Secondary | ICD-10-CM | POA: Diagnosis not present

## 2021-10-05 LAB — FERRITIN: Ferritin: 177 ng/mL (ref 11–307)

## 2021-10-05 LAB — CBC WITH DIFFERENTIAL (CANCER CENTER ONLY)
Abs Immature Granulocytes: 0.02 10*3/uL (ref 0.00–0.07)
Basophils Absolute: 0 10*3/uL (ref 0.0–0.1)
Basophils Relative: 0 %
Eosinophils Absolute: 0.2 10*3/uL (ref 0.0–0.5)
Eosinophils Relative: 2 %
HCT: 36.8 % (ref 36.0–46.0)
Hemoglobin: 11.6 g/dL — ABNORMAL LOW (ref 12.0–15.0)
Immature Granulocytes: 0 %
Lymphocytes Relative: 24 %
Lymphs Abs: 2.1 10*3/uL (ref 0.7–4.0)
MCH: 25.4 pg — ABNORMAL LOW (ref 26.0–34.0)
MCHC: 31.5 g/dL (ref 30.0–36.0)
MCV: 80.7 fL (ref 80.0–100.0)
Monocytes Absolute: 0.5 10*3/uL (ref 0.1–1.0)
Monocytes Relative: 6 %
Neutro Abs: 6 10*3/uL (ref 1.7–7.7)
Neutrophils Relative %: 68 %
Platelet Count: 202 10*3/uL (ref 150–400)
RBC: 4.56 MIL/uL (ref 3.87–5.11)
RDW: 14.2 % (ref 11.5–15.5)
WBC Count: 8.8 10*3/uL (ref 4.0–10.5)
nRBC: 0 % (ref 0.0–0.2)

## 2021-10-05 LAB — CMP (CANCER CENTER ONLY)
ALT: 47 U/L — ABNORMAL HIGH (ref 0–44)
AST: 35 U/L (ref 15–41)
Albumin: 3.6 g/dL (ref 3.5–5.0)
Alkaline Phosphatase: 53 U/L (ref 38–126)
Anion gap: 10 (ref 5–15)
BUN: 18 mg/dL (ref 8–23)
CO2: 25 mmol/L (ref 22–32)
Calcium: 8.9 mg/dL (ref 8.9–10.3)
Chloride: 107 mmol/L (ref 98–111)
Creatinine: 0.88 mg/dL (ref 0.44–1.00)
GFR, Estimated: >60 mL/min (ref >60)
Glucose, Bld: 126 mg/dL — ABNORMAL HIGH (ref 70–99)
Potassium: 3.8 mmol/L (ref 3.5–5.1)
Sodium: 142 mmol/L (ref 135–145)
Total Bilirubin: 0.3 mg/dL (ref 0.3–1.2)
Total Protein: 6.7 g/dL (ref 6.5–8.1)

## 2021-10-05 LAB — IRON AND TIBC
Iron: 52 ug/dL (ref 41–142)
Saturation Ratios: 13 % — ABNORMAL LOW (ref 21–57)
TIBC: 399 ug/dL (ref 236–444)
UIBC: 347 ug/dL (ref 120–384)

## 2021-10-05 NOTE — Progress Notes (Signed)
[place]   [phone] [phone]   Clinic Follow up Note   Patient Care Team: Wenda Low, MD as PCP - General (Internal Medicine) Stark Klein, MD as Consulting Physician (General Surgery)  Date of Service:  10/05/2021  CHIEF COMPLAINT: f/u of recurrent right breast LCIS  CURRENT THERAPY:  Tamoxifen 10mg  once daily starting in 08/2020   ASSESSMENT & PLAN:  Ashley Pratt is a 70 y.o. female with   1. Right breast LCIS, High grade with necrosis, pleomorphic, recurrence in 04/2020  -She was initially diagnosed with LCIS in 02/2013. She was treated with right lumpectomy with Dr Barry Dienes and about 3 years of antiestrogen therapy (exemestane and Tamoxifen), which she stopped on her own due to poor tolerance.  -She has recurrence based on 04/19/20 Mammogram and 04/2020 biopsy with high grade 50mm of LCIS in her UIQ of right breast with necrosis and calcification, and multifocal foci. -She was treated with Right lumpectomy with Dr Barry Dienes on 08/02/20. -she started low dose 10mg  Tamoxifen daily in 08/2020. Plan to take for 5 years.  -most recent mammogram 04/19/21 was benign. -She is clinically doing well and stable. Lab reviewed, her CBC and CMP are within normal limits except Hg 11.6 and ALT 47. Her physical exam was unremarkable. There is no clinical concern for recurrence. -Continue Surveillance and Tamoxifen.  -she is tolerating tamoxifen well, so I will see her back in a year.   2. HTN, GERD, Arthritis, Heart murmur  -Only on Losartan and controlled.  -She will continue to f/u with PCP and cardiologist  -Pt attributed 10mg  Tamoxifen to increasing her HTN.  -BP 181/67 today (10/05/21).   3. Anemia -She has had mild anemia since being on Tamoxifen. Hg was normal in 2016 -iron panel today is pending.   4. Osteopenia -Her 02/2016 DEXA showed osteopenia with T-score -1.5. Her 03/16/21 DEXA shows T-score -1.7 at [facility] spine. Mostly stable. Will  monitor and repeat in 2-3 years.  -I recommend she take OTC Calcium and Vit D. I discussed Tamoxifen will also help strengthen her bone.   5. Cancer Screening -she has not had colonoscopy recently (maybe 10+ years ago). I advised her to at least complete Cologuard if she is not comfortable with colonoscopy. -she notes she does not see GYN and has not had a pap smear lately. -I advised her to discuss screening with her PCP.     PLAN:  -Continue Tamoxifen at 10mg  daily, refilled today  -Mammogram 03/2022 -Lab and F/u in 1 year   No problem-specific Assessment & Plan notes found for this encounter.   SUMMARY OF ONCOLOGIC HISTORY: Oncology History Overview Note  Cancer Staging Breast neoplasm LCIS (right) Staging form: Breast, AJCC 7th Edition - Clinical: Stage Unknown (Tis (LCIS), NX, cM0) - Signed by Gordy Levan, MD on 07/12/2014 - Pathologic: No stage assigned - Unsigned    Neoplasm of right breast, 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 This measures 2.1 x 0.7 x 2.2 cm.  At the  distal end of this hypoechoic area, there is another hypoechoic  area that may contain calcifications, 6 cm from the right nipple at  11 o'clock measuring 9 x 4 x 6 mm.  Findings are concerning for  possible carcinoma.    03/10/2013  Initial Biopsy   Diagnosis Breast, right, needle core biopsy, 9:30, 8cm/nipple - LOBULAR CARCINOMA IN SITU WITH NECROSIS AND CALCIFICATION, SEE COMMENT. Microscopic Comment There is extensive lobular carcinoma in situ, pleomorphic type, present with associated necrosis and calcification. The presence of the myoepithelial layer was confirmed with p63, smooth muscle mycin heavy chain, and calponin immunostains. The case was reviewed with Dr. Gari Crown who concurs.  (CRR:caf 03/12/13)   05/11/2013 Surgery   RIGHT BREAST NEEDLE LOCALIZATION  LUMPECTOMY by Dr Barry Dienes    05/11/2013 Pathology Results   Diagnosis Breast, lumpectomy, Right - LOBULAR CARCINOMA IN SITU WITH CALCIFICATIONS, PARTIALLY INVOLVING AN INTRADUCTAL PAPILLOMA. - FIBROCYSTIC CHANGES WITH CALCIFICATIONS. - HEALING BIOPSY SITE. - SEE COMMENT. Microscopic Comment Immunohistochemical stains for smooth muscle myosin, calponin, p63 and cytokeratin AE1/AE3 fail to highlight the presence of invasive carcinoma. A cytokeratin 5/6 stain is negative for the presence of atypical ductal hyperplasia. The surgical resection margin(s) of the specimen were inked and microscopically evaluated. (JBK:caf 05/14/13)   07/18/2013 Imaging   DEXA  Osteopenia with Lowest T-score -1.7 at [facility]    06/2013 - 03/2016 Anti-estrogen oral therapy   She tried Tamoxifen and Exemestane. She was Intolerant to tamoxifen initially. Cumulative stiffness/ arthralgias from aromasin already resolved. She stopped on her own.    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 This patient is considered osteopenic according to [facility]) criteria. There has been a statistically significant increase in BMD of Lumbar spine and no statistically significant change in left hip since prior exam dated 07/28/2013.   05/09/2020 Mammogram   There are developing grouped calcifications in the anterior third of the upper inner quadrant of the right breast spanning an area of 5 mm. They are indeterminate.    05/13/2020 Relapse/Recurrence   Diagnosis Breast, right, needle core biopsy, UIQ - MAMMARY CARCINOMA IN-SITU WITH NECROSIS AND CALCIFICATIONS - SEE COMMENT Microscopic Comment There is a focus concerning but not  definitive for microinvasion. Based on the biopsy, the carcinoma in situ has a pleomorphic pattern, high nuclear grade and measures 0.4 cm in greatest linear extent. E-cadherin is pending and will be reported in an addendum. Dr. Jeannie Done reviewed the case and agrees with the above diagnosis. These results were called to [facility] on May 16, 2020.   08/02/2020 Surgery   RIGHT BREAST LUMPECTOMY WITH RADIOACTIVE SEED LOCALIZATION by Dr Barry Dienes    08/02/2020 Pathology Results   FINAL MICROSCOPIC DIAGNOSIS:   A. BREAST, RIGHT, LUMPECTOMY:  - Biopsy site.  Lobular carcinoma in situ.  Usual duct epithelial  hyperplasia.  Fibrocystic change.   B. BREAST, RIGHT, ADDITIONAL POSTERIOR MARGIN, EXCISION:  - Usual duct epithelial hyperplasia.  Fibrocystic change.   COMMENT:   A. The lobular carcinoma in situ observed is not of the pleomorphic  type.   B. E-cadherin and CK 5/6 are positive in usual duct epithelial  hyperplasia.    08/2020 -  Anti-estrogen oral therapy   Tamoxifen 10mg  once daily starting in 08/2020      INTERVAL HISTORY:  Ashley Pratt is here for a follow up of LCIS. She was last seen by me on 04/05/21. She presents to the clinic accompanied by her husband.   All other systems were reviewed with the patient and  are negative.  MEDICAL HISTORY:  Past Medical History:  Diagnosis Date   Arthritis    Chronic back pain    Colon polyps    GERD (gastroesophageal reflux disease)    Heart murmur    Hypertension    Malignant neoplasm of right female breast ([place])    unspecified site of breast   Osteoarthritis    of the knee left worse than right   Prediabetes     SURGICAL HISTORY: Past Surgical History:  Procedure Laterality Date   BREAST EXCISIONAL BIOPSY Right 2014   BREAST LUMPECTOMY Right 05/11/2013   high risk lumpectomy   BREAST LUMPECTOMY Right 2021   LCIS   BREAST LUMPECTOMY WITH NEEDLE LOCALIZATION Right 05/11/2013   Procedure:  RIGHT BREAST NEEDLE LOCALIZATION  LUMPECTOMY;  Surgeon: Stark Klein, MD;  Location: [place];  Service: General;  Laterality: Right;   BREAST LUMPECTOMY WITH RADIOACTIVE SEED LOCALIZATION Right 08/02/2020   Procedure: RIGHT BREAST LUMPECTOMY WITH RADIOACTIVE SEED LOCALIZATION;  Surgeon: Stark Klein, MD;  Location: [place];  Service: General;  Laterality: Right;  RNFA   CHOLECYSTECTOMY     COLONOSCOPY     FOOT OSTEOTOMY     both  feet   TONSILLECTOMY      I have reviewed the social history and family history with the patient and they are unchanged from previous note.  ALLERGIES:  is allergic to aspirin and penicillins.  MEDICATIONS:  Current Outpatient Medications  Medication Sig Dispense Refill   losartan (COZAAR) 50 MG tablet Take 1 tablet by mouth daily.  1   Multiple Vitamin (MULTIVITAMIN) capsule Take 1 capsule by mouth daily.     tamoxifen (NOLVADEX) 10 MG tablet TAKE 1 TABLET BY MOUTH EVERY DAY 90 tablet 1   No current facility-administered medications for this visit.    PHYSICAL EXAMINATION: ECOG PERFORMANCE STATUS: 0 - Asymptomatic  Vitals:   10/05/21 1237  BP: (!) 181/67  Pulse: 66  Resp: 17  Temp: 98.9 F (37.2 C)  SpO2: 98%   Wt Readings from Last 3 Encounters:  10/05/21 62.5 kg  04/05/21 66 kg  01/02/21 64.8 kg     GENERAL:alert, no distress and comfortable SKIN: skin color, texture, turgor are normal, no rashes or significant lesions EYES: normal, Conjunctiva are pink and non-injected, sclera clear  NECK: supple, thyroid normal size, non-tender, without nodularity LYMPH:  no palpable lymphadenopathy in the cervical, axillary  LUNGS: clear to auscultation and percussion with normal breathing effort HEART: regular rate & rhythm and no murmurs and no lower extremity edema ABDOMEN:abdomen soft, non-tender and normal bowel sounds Musculoskeletal:no cyanosis of digits and no clubbing  NEURO: alert & oriented x 3 with  fluent speech, no focal motor/sensory deficits BREAST: No palpable mass, nodules or adenopathy bilaterally. Breast exam benign.   LABORATORY DATA:  I have reviewed the data as listed CBC Latest Ref Rng & Units 10/05/2021 04/05/2021 01/02/2021  WBC 4.0 - 10.5 K/uL 8.8 7.9 8.8  Hemoglobin 12.0 - 15.0 g/dL 11.6(L) 11.6(L) 11.9(L)  Hematocrit 36.0 - 46.0 % 36.8 36.7 37.4  Platelets 150 - 400 K/uL 202 178 182     CMP Latest Ref Rng & Units 10/05/2021 04/05/2021 01/02/2021  Glucose 70 - 99 mg/dL 126(H) 105(H) 125(H)  BUN 8 - 23 mg/dL 18 17 15   Creatinine 0.44 - 1.00 mg/dL 0.88 0.86 0.91  Sodium 135 - 145 mmol/L 142 141 141  Potassium 3.5 - 5.1 mmol/L 3.8 4.3 4.0  Chloride 98 -  111 mmol/L 107 106 105  CO2 22 - 32 mmol/L 25 26 29   Calcium 8.9 - 10.3 mg/dL 8.9 8.7(L) 9.0  Total Protein 6.5 - 8.1 g/dL 6.7 6.5 6.5  Total Bilirubin 0.3 - 1.2 mg/dL 0.3 0.2(L) 0.3  Alkaline Phos 38 - 126 U/L 53 36(L) 34(L)  AST 15 - 41 U/L 35 22 20  ALT 0 - 44 U/L 47(H) 20 17      RADIOGRAPHIC STUDIES: I have personally reviewed the radiological images as listed and agreed with the findings in the report. No results found.    Orders Placed This Encounter  Procedures   MM DIAG BREAST TOMO BILATERAL    Standing Status:   Future    Standing Expiration Date:   10/05/2022    Order Specific Question:   Reason for Exam (SYMPTOM  OR DIAGNOSIS REQUIRED)    Answer:   screening    Order Specific Question:   Preferred imaging location?    Answer:   [facility]   MR BREAST BILATERAL [facility]    [employer] Epic ORDER NO COVID PF:04-19-2021 BCG CYCLE: N/A DX: Neoplasm of right breast, primary tumor staging category Tis: lobular carcinoma in situ (LCIS WT:140 HT:5'0 NO NEEDS/NO CLAUS/MAY HAVE METAL IN BOTH FEET/ NO METAL REMOVED/ NO BULLETS OR BB'S/ NO GLUCOSE MONITOR, SPINAL STIMULATOR OR INJECTORS/  PREV SX/ NO BRAIN, HEART,EYE, OR EAR SX NKDA TO IV DYE CONTRAST DP AND PT HUSBAND ORDER CHECKED DP  10-05-2021 REQUESTED INTERPRETER 10-05-2021    Standing Status:   Future    Standing Expiration Date:   10/05/2022    Order Specific Question:   If indicated for the ordered procedure, I authorize the administration of contrast media per Radiology protocol    Answer:   Yes    Order Specific Question:   What is the patient's sedation requirement?    Answer:   No Sedation    Order Specific Question:   Does the patient have a pacemaker or implanted devices?    Answer:   No    Order Specific Question:   Radiology Contrast Protocol - do NOT remove file path    Answer:   [email or web address]    Order Specific Question:   Preferred imaging location?    Answer:   [facility] (table limit-550lbs)   All questions were answered. The patient knows to call the clinic with any problems, questions or concerns. No barriers to learning was detected. The total time spent in the appointment was 25 minutes.     Truitt Merle, MD 10/05/2021   I, Wilburn Mylar, am acting as scribe for Truitt Merle, MD.   I have reviewed the above documentation for accuracy and completeness, and I agree with the above.

## 2021-10-09 ENCOUNTER — Telehealth: Payer: Self-pay

## 2021-10-09 NOTE — Telephone Encounter (Signed)
Spoke with pt's spouse about pt's iron lab results.  Informed pt and spouse that Dr. Burr Medico has reviewed the pt's labs and the iron levels are WNL.  Dr Burr Medico recommends that the pt continues to take daily multivitamins.  Pt's spouse verbalized understanding of instruction.  Instructed pt and pt's spouse to feel free to contact Dr. Ernestina Penna office should you have additional questions or concerns.

## 2021-10-23 ENCOUNTER — Ambulatory Visit
Admission: RE | Admit: 2021-10-23 | Discharge: 2021-10-23 | Disposition: A | Discharge status: Home / Self Care | Payer: Medicare HMO | Source: Ambulatory Visit | Attending: Hematology | Admitting: Hematology

## 2021-10-23 ENCOUNTER — Other Ambulatory Visit: Payer: Medicare HMO

## 2021-10-23 DIAGNOSIS — D0501 Lobular carcinoma in situ of right breast: Secondary | ICD-10-CM | POA: Diagnosis not present

## 2021-10-23 MED ORDER — GADOBUTROL 1 MMOL/ML IV SOLN
7.0000 mL | Freq: Once | INTRAVENOUS | Status: AC | PRN
  Administered 2021-10-23: 09:00:00 7 mL via INTRAVENOUS

## 2021-11-10 ENCOUNTER — Other Ambulatory Visit: Payer: Self-pay

## 2021-11-10 NOTE — Progress Notes (Signed)
Pt's spouse LVM wanting to know pt's MRI results from December 2022.  Sent Dr. Burr Medico a staff message to contact pt and spouse to go over MRI results.

## 2021-11-15 ENCOUNTER — Other Ambulatory Visit: Payer: Self-pay | Admitting: Hematology

## 2021-11-15 DIAGNOSIS — D0501 Lobular carcinoma in situ of right breast: Secondary | ICD-10-CM

## 2021-11-15 DIAGNOSIS — Z1231 Encounter for screening mammogram for malignant neoplasm of breast: Secondary | ICD-10-CM

## 2021-12-07 ENCOUNTER — Encounter (HOSPITAL_COMMUNITY): Payer: Self-pay

## 2022-01-23 DIAGNOSIS — M5136 Other intervertebral disc degeneration, lumbar region: Secondary | ICD-10-CM | POA: Diagnosis not present

## 2022-01-23 DIAGNOSIS — I10 Essential (primary) hypertension: Secondary | ICD-10-CM | POA: Diagnosis not present

## 2022-01-23 DIAGNOSIS — C50911 Malignant neoplasm of unspecified site of right female breast: Secondary | ICD-10-CM | POA: Diagnosis not present

## 2022-01-23 DIAGNOSIS — R7303 Prediabetes: Secondary | ICD-10-CM | POA: Diagnosis not present

## 2022-01-23 DIAGNOSIS — M179 Osteoarthritis of knee, unspecified: Secondary | ICD-10-CM | POA: Diagnosis not present

## 2022-01-23 DIAGNOSIS — I519 Heart disease, unspecified: Secondary | ICD-10-CM | POA: Diagnosis not present

## 2022-02-01 DIAGNOSIS — C50919 Malignant neoplasm of unspecified site of unspecified female breast: Secondary | ICD-10-CM | POA: Diagnosis not present

## 2022-02-01 DIAGNOSIS — Z008 Encounter for other general examination: Secondary | ICD-10-CM | POA: Diagnosis not present

## 2022-02-01 DIAGNOSIS — K219 Gastro-esophageal reflux disease without esophagitis: Secondary | ICD-10-CM | POA: Diagnosis not present

## 2022-02-01 DIAGNOSIS — Z823 Family history of stroke: Secondary | ICD-10-CM | POA: Diagnosis not present

## 2022-02-01 DIAGNOSIS — K08409 Partial loss of teeth, unspecified cause, unspecified class: Secondary | ICD-10-CM | POA: Diagnosis not present

## 2022-02-01 DIAGNOSIS — I10 Essential (primary) hypertension: Secondary | ICD-10-CM | POA: Diagnosis not present

## 2022-02-01 DIAGNOSIS — Z8249 Family history of ischemic heart disease and other diseases of the circulatory system: Secondary | ICD-10-CM | POA: Diagnosis not present

## 2022-02-21 DIAGNOSIS — I10 Essential (primary) hypertension: Secondary | ICD-10-CM | POA: Diagnosis not present

## 2022-02-21 DIAGNOSIS — R7303 Prediabetes: Secondary | ICD-10-CM | POA: Diagnosis not present

## 2022-04-18 DIAGNOSIS — R7303 Prediabetes: Secondary | ICD-10-CM | POA: Diagnosis not present

## 2022-04-18 DIAGNOSIS — C50911 Malignant neoplasm of unspecified site of right female breast: Secondary | ICD-10-CM | POA: Diagnosis not present

## 2022-04-18 DIAGNOSIS — I10 Essential (primary) hypertension: Secondary | ICD-10-CM | POA: Diagnosis not present

## 2022-04-18 DIAGNOSIS — M8580 Other specified disorders of bone density and structure, unspecified site: Secondary | ICD-10-CM | POA: Diagnosis not present

## 2022-04-18 DIAGNOSIS — I359 Nonrheumatic aortic valve disorder, unspecified: Secondary | ICD-10-CM | POA: Diagnosis not present

## 2022-04-18 DIAGNOSIS — M179 Osteoarthritis of knee, unspecified: Secondary | ICD-10-CM | POA: Diagnosis not present

## 2022-04-24 ENCOUNTER — Ambulatory Visit
Admission: RE | Admit: 2022-04-24 | Discharge: 2022-04-24 | Disposition: A | Discharge status: Home / Self Care | Payer: Medicare HMO | Source: Ambulatory Visit | Attending: Hematology | Admitting: Hematology

## 2022-04-24 DIAGNOSIS — D0501 Lobular carcinoma in situ of right breast: Secondary | ICD-10-CM

## 2022-04-24 DIAGNOSIS — Z1231 Encounter for screening mammogram for malignant neoplasm of breast: Secondary | ICD-10-CM

## 2022-04-24 HISTORY — DX: Malignant neoplasm of unspecified site of unspecified female breast: C50.919

## 2022-05-02 DIAGNOSIS — M79662 Pain in left lower leg: Secondary | ICD-10-CM | POA: Diagnosis not present

## 2022-05-02 DIAGNOSIS — M1711 Unilateral primary osteoarthritis, right knee: Secondary | ICD-10-CM | POA: Diagnosis not present

## 2022-05-02 DIAGNOSIS — M170 Bilateral primary osteoarthritis of knee: Secondary | ICD-10-CM | POA: Diagnosis not present

## 2022-05-02 DIAGNOSIS — M1712 Unilateral primary osteoarthritis, left knee: Secondary | ICD-10-CM | POA: Diagnosis not present

## 2022-05-11 ENCOUNTER — Other Ambulatory Visit: Payer: Self-pay | Admitting: Hematology

## 2022-05-17 ENCOUNTER — Other Ambulatory Visit: Payer: Self-pay | Admitting: Internal Medicine

## 2022-05-17 ENCOUNTER — Ambulatory Visit
Admission: RE | Admit: 2022-05-17 | Discharge: 2022-05-17 | Disposition: A | Discharge status: Home / Self Care | Payer: Medicare HMO | Source: Ambulatory Visit | Attending: Internal Medicine | Admitting: Internal Medicine

## 2022-05-17 DIAGNOSIS — M549 Dorsalgia, unspecified: Secondary | ICD-10-CM

## 2022-05-17 DIAGNOSIS — I10 Essential (primary) hypertension: Secondary | ICD-10-CM | POA: Diagnosis not present

## 2022-05-17 DIAGNOSIS — M533 Sacrococcygeal disorders, not elsewhere classified: Secondary | ICD-10-CM | POA: Diagnosis not present

## 2022-05-17 DIAGNOSIS — R1310 Dysphagia, unspecified: Secondary | ICD-10-CM | POA: Diagnosis not present

## 2022-05-17 DIAGNOSIS — K219 Gastro-esophageal reflux disease without esophagitis: Secondary | ICD-10-CM | POA: Diagnosis not present

## 2022-05-22 DIAGNOSIS — Z1211 Encounter for screening for malignant neoplasm of colon: Secondary | ICD-10-CM | POA: Diagnosis not present

## 2022-05-22 DIAGNOSIS — R1310 Dysphagia, unspecified: Secondary | ICD-10-CM | POA: Diagnosis not present

## 2022-05-22 DIAGNOSIS — R0789 Other chest pain: Secondary | ICD-10-CM | POA: Diagnosis not present

## 2022-05-22 DIAGNOSIS — K219 Gastro-esophageal reflux disease without esophagitis: Secondary | ICD-10-CM | POA: Diagnosis not present

## 2022-05-22 DIAGNOSIS — R0602 Shortness of breath: Secondary | ICD-10-CM | POA: Diagnosis not present

## 2022-06-14 ENCOUNTER — Ambulatory Visit: Payer: Medicare HMO | Admitting: Cardiology

## 2022-06-22 ENCOUNTER — Ambulatory Visit (HOSPITAL_BASED_OUTPATIENT_CLINIC_OR_DEPARTMENT_OTHER): Payer: Medicare HMO | Admitting: Cardiology

## 2022-06-27 NOTE — Progress Notes (Unsigned)
Cardiology Office Note   Date:  06/28/2022   ID:  [name], [name] 07/07/1951, MRN [id number]  PCP:  Ashley Low, MD  Cardiologist:   None Referring:  Ashley Craver, MD  Chief Complaint  Patient presents with   Shortness of Breath      History of Present Illness: Ashley Pratt is a 71 y.o. female who is referred by Ashley Craver, MD  for evaluation of chest pain.  At first the patient did not want to use a translator and she was using her husband.  I spoke with Ashley Craver, MD who convinced her to let me use the translator app.  The patient reports her predominant complaint is shortness of breath.  This happens with activities around the house.  It happens if she climbs stairs or she rushes to do her chores and she does all of these activities.  I think she minimizes the symptoms.  She is not really describing chest pressure, neck or arm discomfort.  She is not describing resting shortness of breath, PND or orthopnea.  She says her heart rate does go fast when she is exerting herself when she has to go up the stairs.  She has not had any presyncope or syncope however.  She saw Ashley Pratt in 2015.  She had a negative POET ([employer] Exercise Treadmill) for chest pain.   I saw her in 2018.  Echo in 2018 demonstrated mild as.  She has a persistent left sided SVC.    She had a negative POET ([employer] Exercise Treadmill)f or SOB in 2018.  She was supposed to see me 3 months after that echocardiogram but she never showed up and reports today that that was her choice.  Past Medical History:  Diagnosis Date   Aortic stenosis    Arthritis    Breast cancer (HCC)    Chronic back pain    Colon polyps    GERD (gastroesophageal reflux disease)    Hypertension    Malignant neoplasm of right female breast ([place])    unspecified site of breast   Osteoarthritis    of the knee left worse than right   Prediabetes     Past Surgical History:  Procedure Laterality Date    BREAST EXCISIONAL BIOPSY Right 2014   BREAST LUMPECTOMY Right 05/11/2013   high risk lumpectomy   BREAST LUMPECTOMY Right 2021   LCIS   BREAST LUMPECTOMY WITH NEEDLE LOCALIZATION Right 05/11/2013   Procedure: RIGHT BREAST NEEDLE LOCALIZATION  LUMPECTOMY;  Surgeon: Ashley Klein, MD;  Location: [place];  Service: General;  Laterality: Right;   BREAST LUMPECTOMY WITH RADIOACTIVE SEED LOCALIZATION Right 08/02/2020   Procedure: RIGHT BREAST LUMPECTOMY WITH RADIOACTIVE SEED LOCALIZATION;  Surgeon: Ashley Klein, MD;  Location: [place];  Service: General;  Laterality: Right;  RNFA   CHOLECYSTECTOMY     COLONOSCOPY     FOOT OSTEOTOMY     both  feet   TONSILLECTOMY       Current Outpatient Medications  Medication Sig Dispense Refill   Multiple Vitamin (MULTIVITAMIN) capsule Take 1 capsule by mouth daily.     pantoprazole (PROTONIX) 20 MG tablet Take 20 mg by mouth daily.     tamoxifen (NOLVADEX) 10 MG tablet TAKE 1 TABLET BY MOUTH EVERY DAY 90 tablet 1   losartan (COZAAR) 50 MG tablet Take 1 tablet (50 mg total) by mouth 2 (two) times daily. 180 tablet 3   No current facility-administered  medications for this visit.    Allergies:   Aspirin and Penicillins    Social History:  The patient  reports that she has never smoked. She has never used smokeless tobacco. She reports that she does not drink alcohol and does not use drugs.   Family History:  The patient's family history includes Hypertension in her brother, mother, and sister; Stroke in her mother.    ROS:  Please see the history of present illness.   Otherwise, review of systems are positive for none.   All other systems are reviewed and negative.    PHYSICAL EXAM: VS:  BP (!) 165/76   Pulse 65   Ht 5' (1.524 m)   Wt 140 lb 6.4 oz (63.7 kg)   SpO2 99%   BMI 27.42 kg/m  , BMI Body mass index is 27.42 kg/m. GENERAL:  Well appearing HEENT:  Pupils equal round and reactive, fundi not  visualized, oral mucosa unremarkable NECK:  No jugular venous distention, waveform within normal limits, carotid upstroke diminished and symmetric, no bruits, no thyromegaly LYMPHATICS:  No cervical, inguinal adenopathy LUNGS:  Clear to auscultation bilaterally BACK:  No CVA tenderness CHEST:  Unremarkable HEART:  PMI not displaced or sustained,S1 and S2 within normal limits, no S3, no S4, no clicks, no rubs, 3 out of 6 apical late peaking systolic murmur radiating at the aortic outflow tract and no diastolic murmurs ABD:  Flat, positive bowel sounds normal in frequency in pitch, no bruits, no rebound, no guarding, no midline pulsatile mass, no hepatomegaly, no splenomegaly EXT:  2 plus pulses throughout, no edema, no cyanosis no clubbing SKIN:  No rashes no nodules NEURO:  Cranial nerves II through XII grossly intact, motor grossly intact throughout PSYCH:  Cognitively intact, oriented to person place and time    EKG:  EKG is ordered today. The ekg ordered today demonstrates sinus rhythm, rate 65, axis within normal limits, intervals within normal limits, no acute ST-T wave changes.   Recent Labs: 10/05/2021: ALT 47; BUN 18; Creatinine 0.88; Hemoglobin 11.6; Platelet Count 202; Potassium 3.8; Sodium 142    Lipid Panel No results found for: "CHOL", "TRIG", "HDL", "CHOLHDL", "VLDL", "LDLCALC", "LDLDIRECT"    Wt Readings from Last 3 Encounters:  06/28/22 140 lb 6.4 oz (63.7 kg)  10/05/21 137 lb 11.2 oz (62.5 kg)  04/05/21 145 lb 9.6 oz (66 kg)      Other studies Reviewed: Additional studies/ records that were reviewed today include: Labs. Review of the above records demonstrates:  Please see elsewhere in the note.     ASSESSMENT AND PLAN:  AS: I suspect this is not severe.  I am going to start with an echocardiogram.  I reviewed with him through the translator aortic stenosis and actually showed in the TAVR video so that they would understand.  We have to determine whether this  is severe enough to likely be causing her shortness of breath.  Proceed with further evaluation.  She is to hold off on GI evaluation until the echo and follow-up with me.  SOB: I will assess this as above.  After the next visit I will consider follow-up labs to include BNP.  HTN: Her blood pressure is not controlled.  She was supposed to be taking Cozaar 50 mg twice daily which was recently increased by her primary provider but she did not do this.  She was also given HCTZ apparently which she did not do.  I have instructed her to at least begin  the increased dose of Cozaar and then to keep a blood pressure.  Persistent SVC:   This is not causing any symptoms.  We will be imaging this further I suspect the CTs pre-TAVR eventually.  Current medicines are reviewed at length with the patient today.  The patient does not have concerns regarding medicines.  The following changes have been made:  no change  Labs/ tests ordered today include:   Orders Placed This Encounter  Procedures   EKG 12-Lead   ECHOCARDIOGRAM COMPLETE     Disposition:   FU with me after the echocardiogram   Signed, Minus Breeding, MD  06/28/2022 9:54 AM    [place]

## 2022-06-28 ENCOUNTER — Ambulatory Visit: Payer: Medicare HMO | Attending: Cardiology | Admitting: Cardiology

## 2022-06-28 ENCOUNTER — Encounter: Payer: Self-pay | Admitting: Cardiology

## 2022-06-28 VITALS — BP 165/76 | HR 65 | Ht 60.0 in | Wt 140.4 lb

## 2022-06-28 DIAGNOSIS — Q269 Congenital malformation of great vein, unspecified: Secondary | ICD-10-CM | POA: Diagnosis not present

## 2022-06-28 DIAGNOSIS — I350 Nonrheumatic aortic (valve) stenosis: Secondary | ICD-10-CM

## 2022-06-28 DIAGNOSIS — R0602 Shortness of breath: Secondary | ICD-10-CM | POA: Diagnosis not present

## 2022-06-28 MED ORDER — LOSARTAN POTASSIUM 50 MG PO TABS: 50.0000 mg | ORAL_TABLET | Freq: Two times a day (BID) | ORAL | 3 refills | Status: DC

## 2022-06-28 NOTE — Patient Instructions (Signed)
Medication Instructions:   INCREASE LOSARTAN TO 50 MG TWICE DAILY  *If you need a refill on your cardiac medications before your next appointment, please call your pharmacy*  Testing/Procedures:  Your physician has requested that you have an echocardiogram. Echocardiography is a painless test that uses sound waves to create images of your heart. It provides your doctor with information about the size and shape of your heart and how well your heart's chambers and valves are working. This procedure takes approximately one hour. There are no restrictions for this procedure. [place], you and your health needs are our priority.  As part of our continuing mission to provide you with exceptional heart care, we have created designated Provider Care Teams.  These Care Teams include your primary Cardiologist (physician) and Advanced Practice Providers (APPs -  Physician Assistants and Nurse Practitioners) who all work together to provide you with the care you need, when you need it.  We recommend signing up for the patient portal called "MyChart".  Sign up information is provided on this After Visit Summary.  MyChart is used to connect with patients for Virtual Visits (Telemedicine).  Patients are able to view lab/test results, encounter notes, upcoming appointments, etc.  Non-urgent messages can be sent to your provider as well.   To learn more about what you can do with MyChart, go to [email or web address].    Your next appointment:    AFTER ECHO COMPLETE WITH DR [facility]

## 2022-07-06 ENCOUNTER — Telehealth: Payer: Self-pay | Admitting: *Deleted

## 2022-07-06 NOTE — Telephone Encounter (Signed)
   Pre-operative Risk Assessment    Patient Name: Ashley Pratt  DOB: [birth year] MRN: [id number]      Request for Surgical Clearance    Procedure:   EGD   Date of Surgery:  Clearance TBD                                 Surgeon:  DR. MANN Surgeon's Group or Practice Name:  [facility] Phone number:  [phone] Fax number:  [phone]   Type of Clearance Requested:   - Medical ; NO MEDICATIONS LISTED AS NEEDING TO BE HELD    Type of Anesthesia:   PROPOFOL   Additional requests/questions:    Jiles Prows   07/06/2022, 4:37 PM

## 2022-07-09 ENCOUNTER — Ambulatory Visit (HOSPITAL_COMMUNITY): Payer: Medicare HMO | Attending: Cardiology

## 2022-07-09 DIAGNOSIS — I350 Nonrheumatic aortic (valve) stenosis: Secondary | ICD-10-CM | POA: Diagnosis not present

## 2022-07-09 LAB — ECHOCARDIOGRAM COMPLETE
AR max vel: 0.87 cm2
AV Area VTI: 1.06 cm2
AV Area mean vel: 0.98 cm2
AV Mean grad: 17 mmHg
AV Peak grad: 27 mmHg
Ao pk vel: 2.6 m/s
Area-P 1/2: 3.48 cm2
S' Lateral: 2.9 cm

## 2022-07-09 NOTE — Telephone Encounter (Signed)
   Patient Name: Ashley Pratt  DOB: June 03, 1951 MRN: [id number]  Primary Cardiologist: None  Chart reviewed as part of pre-operative protocol coverage. Pre-op clearance already addressed by colleagues in earlier phone notes. To summarize recommendations:  -No EGD until she has her echo to look at the valve. -Dr. Percival Spanish  Will route this bundled recommendation to requesting provider via Epic fax function and remove from pre-op pool. Please call with questions.  Elgie Collard, PA-C 07/09/2022, 8:13 AM

## 2022-07-12 ENCOUNTER — Other Ambulatory Visit (HOSPITAL_COMMUNITY): Payer: Medicare HMO

## 2022-07-15 DIAGNOSIS — I350 Nonrheumatic aortic (valve) stenosis: Secondary | ICD-10-CM | POA: Insufficient documentation

## 2022-07-15 NOTE — Progress Notes (Unsigned)
Cardiology Office Note   Date:  07/16/2022   ID:  Lorilee, Cafarella [birth year], MRN [id number]  PCP:  Wenda Low, MD  Cardiologist:   None   Chief Complaint  Patient presents with   Heart Murmur      History of Present Illness: Ashley Pratt is a 71 y.o. female who is referred by Wenda Low, MD  for evaluation of chest pain.  She saw Dr. Johnsie Cancel in 2015.  She had a negative POET (Plain Old Exercise Treadmill) for chest pain.   I saw her in 2018.  Echo in 2018 demonstrated mild as.  She has a persistent left sided SVC.    She had a negative POET (Plain Old Exercise Treadmill)f or SOB in 2018.  She came back with vague complaints of chest pain and SOB.  She had an echo which demonstrated mild/mod AS.    I brought him back to discuss this.  The use of an interpreter.  She actually says she has had no new symptoms not since her POET (Plain Old Exercise Treadmill) in 2018.  She walks 2 miles a day [place] and might get a little short of breath if she is going up and down hills but this is not worse than before.  She is not having any new chest pressure, neck or arm discomfort.  She is not having any palpitations, presyncope or syncope.  She had no weight gain or edema.   Past Medical History:  Diagnosis Date   Aortic stenosis    Arthritis    Breast cancer (HCC)    Chronic back pain    Colon polyps    GERD (gastroesophageal reflux disease)    Hypertension    Malignant neoplasm of right female breast ([place])    unspecified site of breast   Osteoarthritis    of the knee left worse than right   Prediabetes     Past Surgical History:  Procedure Laterality Date   BREAST EXCISIONAL BIOPSY Right 2014   BREAST LUMPECTOMY Right 05/11/2013   high risk lumpectomy   BREAST LUMPECTOMY Right 2021   LCIS   BREAST LUMPECTOMY WITH NEEDLE LOCALIZATION Right 05/11/2013   Procedure: RIGHT BREAST NEEDLE LOCALIZATION  LUMPECTOMY;  Surgeon: Stark Klein, MD;   Location: [facility];  Service: General;  Laterality: Right;   BREAST LUMPECTOMY WITH RADIOACTIVE SEED LOCALIZATION Right 08/02/2020   Procedure: RIGHT BREAST LUMPECTOMY WITH RADIOACTIVE SEED LOCALIZATION;  Surgeon: Stark Klein, MD;  Location: [facility];  Service: General;  Laterality: Right;  RNFA   CHOLECYSTECTOMY     COLONOSCOPY     FOOT OSTEOTOMY     both  feet   TONSILLECTOMY       Current Outpatient Medications  Medication Sig Dispense Refill   losartan (COZAAR) 50 MG tablet Take 1 tablet (50 mg total) by mouth 2 (two) times daily. 180 tablet 3   Multiple Vitamin (MULTIVITAMIN) capsule Take 1 capsule by mouth daily.     pantoprazole (PROTONIX) 20 MG tablet Take 20 mg by mouth daily.     tamoxifen (NOLVADEX) 10 MG tablet TAKE 1 TABLET BY MOUTH EVERY DAY 90 tablet 1   No current facility-administered medications for this visit.    Allergies:   Aspirin and Penicillins    ROS:  Please see the history of present illness.   Otherwise, review of systems are positive for none.   All other systems are reviewed and negative.  PHYSICAL EXAM: VS:  BP 120/74   Pulse 68   Ht 5' (1.524 m)   Wt 139 lb 3.2 oz (63.1 kg)   SpO2 98%   BMI 27.19 kg/m  , BMI Body mass index is 27.19 kg/m. GENERAL:  Well appearing NECK:  No jugular venous distention, waveform within normal limits, carotid upstroke brisk and symmetric, no bruits, no thyromegaly LUNGS:  Clear to auscultation bilaterally CHEST:  Unremarkable HEART:  PMI not displaced or sustained,S1 and S2 within normal limits, no S3, no S4, no clicks, no rubs, 3 out of 6 apical systolic murmur radiating slightly at the aortic outflow tract and mid peaking, no diastolic murmurs ABD:  Flat, positive bowel sounds normal in frequency in pitch, no bruits, no rebound, no guarding, no midline pulsatile mass, no hepatomegaly, no splenomegaly EXT:  2 plus pulses throughout, no edema, no cyanosis no  clubbing    EKG:  EKG is  not ordered today.   Recent Labs: 10/05/2021: ALT 47; BUN 18; Creatinine 0.88; Hemoglobin 11.6; Platelet Count 202; Potassium 3.8; Sodium 142    Lipid Panel No results found for: "CHOL", "TRIG", "HDL", "CHOLHDL", "VLDL", "LDLCALC", "LDLDIRECT"    Wt Readings from Last 3 Encounters:  07/16/22 139 lb 3.2 oz (63.1 kg)  06/28/22 140 lb 6.4 oz (63.7 kg)  10/05/21 137 lb 11.2 oz (62.5 kg)      Other studies Reviewed: Additional studies/ records that were reviewed today include: Echocardiogram. Review of the above records demonstrates:  Please see elsewhere in the note.     ASSESSMENT AND PLAN:  AS:  This was mild on echo this month.  I will follow this in 1 year with a repeat echocardiogram.  No further intervention at this point.  SOB:   After the careful discussion she again says that she is not having any increased symptoms compared to those she had in 2018.  No change in therapy.  No further testing.   HTN: Her blood pressure is controlled today.  I think she is restarted the medicines and we clarified this at the last visit.  She continues with the regimen as above and can follow her blood pressures at home.   Persistent SVC:   This is not causing any symptoms.  No change in therapy.  Current medicines are reviewed at length with the patient today.  The patient does not have concerns regarding medicines.  The following changes have been made: None  Labs/ tests ordered today include: None  No orders of the defined types were placed in this encounter.    Disposition:   FU with me 1 year   Signed, Minus Breeding, MD  07/16/2022 9:40 AM    [facility]

## 2022-07-16 ENCOUNTER — Encounter: Payer: Self-pay | Admitting: Cardiology

## 2022-07-16 ENCOUNTER — Ambulatory Visit: Payer: Medicare HMO | Attending: Cardiology | Admitting: Cardiology

## 2022-07-16 VITALS — BP 120/74 | HR 68 | Ht 60.0 in | Wt 139.2 lb

## 2022-07-16 DIAGNOSIS — I10 Essential (primary) hypertension: Secondary | ICD-10-CM

## 2022-07-16 DIAGNOSIS — I350 Nonrheumatic aortic (valve) stenosis: Secondary | ICD-10-CM | POA: Diagnosis not present

## 2022-07-16 DIAGNOSIS — R0602 Shortness of breath: Secondary | ICD-10-CM | POA: Diagnosis not present

## 2022-07-16 NOTE — Patient Instructions (Signed)
Medication Instructions:  The current medical regimen is effective;  continue present plan and medications.  *If you need a refill on your cardiac medications before your next appointment, please call your pharmacy*   Testing/Procedures: Echocardiogram (12 month) - Your physician has requested that you have an echocardiogram. Echocardiography is a painless test that uses sound waves to create images of your heart. It provides your doctor with information about the size and shape of your heart and how well your heart's chambers and valves are working. This procedure takes approximately one hour. There are no restrictions for this procedure.    Follow-Up: At [facility], you and your health needs are our priority.  As part of our continuing mission to provide you with exceptional heart care, we have created designated Provider Care Teams.  These Care Teams include your primary Cardiologist (physician) and Advanced Practice Providers (APPs -  Physician Assistants and Nurse Practitioners) who all work together to provide you with the care you need, when you need it.  We recommend signing up for the patient portal called "MyChart".  Sign up information is provided on this After Visit Summary.  MyChart is used to connect with patients for Virtual Visits (Telemedicine).  Patients are able to view lab/test results, encounter notes, upcoming appointments, etc.  Non-urgent messages can be sent to your provider as well.   To learn more about what you can do with MyChart, go to [email or web address].    Your next appointment:   12 month(s)  The format for your next appointment:   In Person  Provider:   Minus Breeding, MD

## 2022-07-17 ENCOUNTER — Other Ambulatory Visit: Payer: Self-pay

## 2022-07-17 DIAGNOSIS — M5136 Other intervertebral disc degeneration, lumbar region: Secondary | ICD-10-CM | POA: Diagnosis not present

## 2022-07-17 DIAGNOSIS — J309 Allergic rhinitis, unspecified: Secondary | ICD-10-CM | POA: Diagnosis not present

## 2022-07-17 DIAGNOSIS — C50911 Malignant neoplasm of unspecified site of right female breast: Secondary | ICD-10-CM | POA: Diagnosis not present

## 2022-07-17 DIAGNOSIS — Z0000 Encounter for general adult medical examination without abnormal findings: Secondary | ICD-10-CM | POA: Diagnosis not present

## 2022-07-17 DIAGNOSIS — R7303 Prediabetes: Secondary | ICD-10-CM | POA: Diagnosis not present

## 2022-07-17 DIAGNOSIS — I350 Nonrheumatic aortic (valve) stenosis: Secondary | ICD-10-CM | POA: Diagnosis not present

## 2022-07-17 DIAGNOSIS — I519 Heart disease, unspecified: Secondary | ICD-10-CM | POA: Diagnosis not present

## 2022-07-17 DIAGNOSIS — Z1389 Encounter for screening for other disorder: Secondary | ICD-10-CM | POA: Diagnosis not present

## 2022-07-17 DIAGNOSIS — K219 Gastro-esophageal reflux disease without esophagitis: Secondary | ICD-10-CM | POA: Diagnosis not present

## 2022-07-17 DIAGNOSIS — M8580 Other specified disorders of bone density and structure, unspecified site: Secondary | ICD-10-CM | POA: Diagnosis not present

## 2022-07-17 DIAGNOSIS — I10 Essential (primary) hypertension: Secondary | ICD-10-CM | POA: Diagnosis not present

## 2022-08-07 DIAGNOSIS — K120 Recurrent oral aphthae: Secondary | ICD-10-CM | POA: Diagnosis not present

## 2022-10-02 NOTE — Progress Notes (Unsigned)
[place]   Telephone:(336) [phone] Fax:(336) [phone]   Clinic Follow up Note   Patient Care Team: Wenda Low, MD as PCP - General (Internal Medicine) Juanita Craver, MD as PCP - Gastroenterology (Gastroenterology) Stark Klein, MD as Consulting Physician (General Surgery)  Date of Service:  10/04/2022  CHIEF COMPLAINT: f/u of recurrent right breast LCIS   CURRENT THERAPY:  Tamoxifen '10mg'$  once daily starting in 08/2020    ASSESSMENT:  Ashley Pratt is a 71 y.o. female with   Breast neoplasm LCIS (right) --She was initially diagnosed with LCIS in 02/2013. She was treated with right lumpectomy with Dr Barry Dienes and about 3 years of antiestrogen therapy (exemestane and Tamoxifen), which she stopped on her own due to poor tolerance.  -She has recurrence based on 04/19/20 Mammogram and 04/2020 biopsy with high grade 60m of LCIS in her UIQ of right breast with necrosis and calcification, and multifocal foci. -She was treated with Right lumpectomy with Dr BBarry Dieneson 08/02/20. -she started low dose '10mg'$  Tamoxifen daily in 08/2020. Plan to take for 5 years.  -I reviewed the phase 3 TAM-01 clinical trial DATA, which showed similar benefit to reduce risk of breast cancer with tamoxifen 5 mg daily for 3 years.  Given her moderate side effect, especially leg cramps, I will reduce her dose to 5 mg for 1 more year.    PLAN: - Discuss Tamoxifen reduce dose 5 mg,should help with leg cramps.  -breast in June and MRI in Dec of 2024 -Lab reviewed -Refill Tamoxifen, she will take half tab daily  -F/U 1 year     SUMMARY OF ONCOLOGIC HISTORY: Oncology History Overview Note  Cancer Staging Breast neoplasm LCIS (right) Staging form: Breast, AJCC 7th Edition - Clinical: Stage Unknown (Tis (LCIS), NX, cM0) - Signed by LGordy Levan MD on 07/12/2014 - Pathologic: No stage assigned - Unsigned    Neoplasm of right breast, primary tumor staging category Tis: lobular  carcinoma in situ (LCIS)  02/20/2013 Mammogram   IMPRESSION:  Suspicious hypoechoic mass at 11 o'clock, 4 cm from the right  nipple with suspicious calcifications in the outer portion of the  right breast. This measures 2.1 x 0.7 x 2.2 cm.  At the  distal end of this hypoechoic area, there is another hypoechoic  area that may contain calcifications, 6 cm from the right nipple at  11 o'clock measuring 9 x 4 x 6 mm.  Findings are concerning for  possible carcinoma.    03/10/2013 Initial Biopsy   Diagnosis Breast, right, needle core biopsy, 9:30, 8cm/nipple - LOBULAR CARCINOMA IN SITU WITH NECROSIS AND CALCIFICATION, SEE COMMENT. Microscopic Comment There is extensive lobular carcinoma in situ, pleomorphic type, present with associated necrosis and calcification. The presence of the myoepithelial layer was confirmed with p63, smooth muscle mycin heavy chain, and calponin immunostains. The case was reviewed with Dr. SGari Crownwho concurs. (CRR:caf 03/12/13)   05/11/2013 Surgery   RIGHT BREAST NEEDLE LOCALIZATION  LUMPECTOMY by Dr BBarry Dienes   05/11/2013 Pathology Results   Diagnosis Breast, lumpectomy, Right - LOBULAR CARCINOMA IN SITU WITH CALCIFICATIONS, PARTIALLY INVOLVING AN INTRADUCTAL PAPILLOMA. - FIBROCYSTIC CHANGES WITH CALCIFICATIONS. - HEALING BIOPSY SITE. - SEE COMMENT. Microscopic Comment Immunohistochemical stains for smooth muscle myosin, calponin, p63 and cytokeratin AE1/AE3 fail to highlight the presence of invasive carcinoma. A cytokeratin 5/6 stain is negative for the presence of atypical ductal hyperplasia. The surgical resection margin(s) of the specimen were inked and microscopically evaluated. (JBK:caf 05/14/13)  07/18/2013 Imaging   DEXA  Osteopenia with Lowest T-score -1.7 at [facility]    06/2013 - 03/2016 Anti-estrogen oral therapy   She tried Tamoxifen and Exemestane. She was Intolerant to tamoxifen initially. Cumulative stiffness/ arthralgias from aromasin already  resolved. She stopped on her own.    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 This patient is considered osteopenic according to [facility]) criteria. There has been a statistically significant increase in BMD of Lumbar spine and no statistically significant change in left hip since prior exam dated 07/28/2013.   05/09/2020 Mammogram   There are developing grouped calcifications in the anterior third of the upper inner quadrant of the right breast spanning an area of 5 mm. They are indeterminate.    05/13/2020 Relapse/Recurrence   Diagnosis Breast, right, needle core biopsy, UIQ - MAMMARY CARCINOMA IN-SITU WITH NECROSIS AND CALCIFICATIONS - SEE COMMENT Microscopic Comment There is a focus concerning but not definitive for microinvasion. Based on the biopsy, the carcinoma in situ has a pleomorphic pattern, high nuclear grade and measures 0.4 cm in greatest linear extent. E-cadherin is pending and will be reported in an addendum. Dr. Jeannie Done reviewed the case and agrees with the above diagnosis. These results were called to [facility] on May 16, 2020.   08/02/2020 Surgery   RIGHT BREAST LUMPECTOMY WITH RADIOACTIVE SEED LOCALIZATION by Dr Barry Dienes    08/02/2020 Pathology Results   FINAL MICROSCOPIC DIAGNOSIS:   A. BREAST, RIGHT, LUMPECTOMY:  - Biopsy site.  Lobular carcinoma in situ.  Usual duct epithelial  hyperplasia.  Fibrocystic change.   B. BREAST, RIGHT, ADDITIONAL POSTERIOR MARGIN, EXCISION:  - Usual duct epithelial hyperplasia.  Fibrocystic change.   COMMENT:   A. The lobular carcinoma in situ observed is not of the pleomorphic  type.   B. E-cadherin and CK 5/6 are positive in usual duct epithelial  hyperplasia.    08/2020 -   Anti-estrogen oral therapy   Tamoxifen '10mg'$  once daily starting in 08/2020      INTERVAL HISTORY:  Ashley Pratt is here for a follow up of recurrent right breast LCIS  She was last seen by me on 10/05/2021 She presents to the clinic accompanied by husband. Pt states she having leg cramps most every night. She takes OTC Magnesium it help a little.     All other systems were reviewed with the patient and are negative.  MEDICAL HISTORY:  Past Medical History:  Diagnosis Date   Aortic stenosis    Arthritis    Breast cancer (HCC)    Chronic back pain    Colon polyps    GERD (gastroesophageal reflux disease)    Hypertension    Malignant neoplasm of right female breast ([place])    unspecified site of breast   Osteoarthritis    of the knee left worse than right   Prediabetes     SURGICAL HISTORY: Past Surgical History:  Procedure Laterality Date   BREAST EXCISIONAL BIOPSY Right 2014   BREAST LUMPECTOMY Right 05/11/2013   high risk lumpectomy   BREAST LUMPECTOMY Right 2021   LCIS   BREAST LUMPECTOMY WITH NEEDLE LOCALIZATION Right 05/11/2013   Procedure: RIGHT BREAST NEEDLE LOCALIZATION  LUMPECTOMY;  Surgeon: Stark Klein, MD;  Location: [facility];  Service: General;  Laterality: Right;   BREAST LUMPECTOMY WITH RADIOACTIVE SEED LOCALIZATION Right 08/02/2020   Procedure: RIGHT BREAST LUMPECTOMY WITH RADIOACTIVE SEED LOCALIZATION;  Surgeon: Stark Klein, MD;  Location: [place];  Service: General;  Laterality: Right;  RNFA   CHOLECYSTECTOMY     COLONOSCOPY     FOOT OSTEOTOMY     both  feet   TONSILLECTOMY      I have reviewed the social history and family history with the patient and they are unchanged from previous note.  ALLERGIES:  is allergic to aspirin and penicillins.  MEDICATIONS:  Current Outpatient Medications  Medication Sig Dispense Refill   losartan (COZAAR) 50 MG tablet Take 1 tablet (50 mg total) by mouth 2 (two) times  daily. 180 tablet 3   Multiple Vitamin (MULTIVITAMIN) capsule Take 1 capsule by mouth daily.     pantoprazole (PROTONIX) 20 MG tablet Take 20 mg by mouth daily.     tamoxifen (NOLVADEX) 10 MG tablet Take 0.5 tablets (5 mg total) by mouth daily. 45 tablet 3   No current facility-administered medications for this visit.    PHYSICAL EXAMINATION: ECOG PERFORMANCE STATUS: 0 - Asymptomatic  Vitals:   10/04/22 1114  BP: (!) 164/65  Pulse: 68  Resp: 18  Temp: 98.7 F (37.1 C)  SpO2: 100%   Wt Readings from Last 3 Encounters:  10/04/22 137 lb 12.8 oz (62.5 kg)  07/16/22 139 lb 3.2 oz (63.1 kg)  06/28/22 140 lb 6.4 oz (63.7 kg)    GENERAL:alert, no distress and comfortable SKIN: skin color, texture, turgor are normal, no rashes or significant lesions EYES: normal, Conjunctiva are pink and non-injected, sclera clear NECK: supple, thyroid normal size, non-tender, without nodularity LYMPH:  no palpable lymphadenopathy in the cervical, axillary (-) LUNGS: clear to auscultation and percussion with normal breathing effort HEART: regular rate & rhythm and no murmurs and no lower extremity edema ABDOMEN:abdomen soft, non-tender and normal bowel sounds Musculoskeletal:no cyanosis of digits and no clubbing  NEURO: alert & oriented x 3 with fluent speech, no focal motor/sensory deficits BREAST: No palpable mass, nodules or adenopathy bilaterally. Breast exam benign.     LABORATORY DATA:  I have reviewed the data as listed    Latest Ref Rng & Units 10/04/2022   10:48 AM 10/05/2021   12:08 PM 04/05/2021    1:56 PM  CBC  WBC 4.0 - 10.5 K/uL 8.7  8.8  7.9   Hemoglobin 12.0 - 15.0 g/dL 12.3  11.6  11.6   Hematocrit 36.0 - 46.0 % 37.4  36.8  36.7   Platelets 150 - 400 K/uL 194  202  178         Latest Ref Rng & Units 10/04/2022   10:48 AM 10/05/2021   12:08 PM 04/05/2021    1:56 PM  CMP  Glucose 70 - 99 mg/dL 124  126  105   BUN 8 - 23 mg/dL '17  18  17   '$ Creatinine 0.44 - 1.00 mg/dL 0.77   0.88  0.86   Sodium 135 - 145 mmol/L 139  142  141   Potassium 3.5 - 5.1 mmol/L 3.9  3.8  4.3   Chloride 98 - 111 mmol/L 103  107  106   CO2 22 - 32 mmol/L '31  25  26   '$ Calcium 8.9 - 10.3 mg/dL 9.4  8.9  8.7   Total Protein 6.5 - 8.1 g/dL 6.5  6.7  6.5  Total Bilirubin 0.3 - 1.2 mg/dL 0.4  0.3  0.2   Alkaline Phos 38 - 126 U/L 34  53  36   AST 15 - 41 U/L 24  35  22   ALT 0 - 44 U/L 23  47  20       RADIOGRAPHIC STUDIES: I have personally reviewed the radiological images as listed and agreed with the findings in the report. No results found.    Orders Placed This Encounter  Procedures   MM Digital Screening    Standing Status:   Future    Standing Expiration Date:   10/04/2023    Order Specific Question:   Reason for Exam (SYMPTOM  OR DIAGNOSIS REQUIRED)    Answer:   screening    Order Specific Question:   Preferred imaging location?    Answer:   [facility]   MR Breast Bilateral Wo Contrast    Standing Status:   Future    Order Specific Question:   What is the patient's sedation requirement?    Answer:   No Sedation    Order Specific Question:   Does the patient have a pacemaker or implanted devices?    Answer:   No    Order Specific Question:   Preferred imaging location?    Answer:   [facility] (table limit-550lbs)   All questions were answered. The patient knows to call the clinic with any problems, questions or concerns. No barriers to learning was detected. The total time spent in the appointment was 30 minutes.     Truitt Merle, MD 10/04/2022   Felicity Coyer, CMA, am acting as scribe for Truitt Merle, MD.   I have reviewed the above documentation for accuracy and completeness, and I agree with the above.

## 2022-10-03 ENCOUNTER — Other Ambulatory Visit: Payer: Self-pay

## 2022-10-03 DIAGNOSIS — D0501 Lobular carcinoma in situ of right breast: Secondary | ICD-10-CM

## 2022-10-03 DIAGNOSIS — D649 Anemia, unspecified: Secondary | ICD-10-CM

## 2022-10-04 ENCOUNTER — Inpatient Hospital Stay (HOSPITAL_BASED_OUTPATIENT_CLINIC_OR_DEPARTMENT_OTHER): Payer: Medicare HMO | Admitting: Hematology

## 2022-10-04 ENCOUNTER — Inpatient Hospital Stay: Payer: Medicare HMO | Attending: Hematology

## 2022-10-04 ENCOUNTER — Other Ambulatory Visit: Payer: Self-pay

## 2022-10-04 ENCOUNTER — Encounter: Payer: Self-pay | Admitting: Hematology

## 2022-10-04 VITALS — BP 164/65 | HR 68 | Temp 98.7°F | Resp 18 | Ht 60.0 in | Wt 137.8 lb

## 2022-10-04 DIAGNOSIS — C50211 Malignant neoplasm of upper-inner quadrant of right female breast: Secondary | ICD-10-CM | POA: Diagnosis not present

## 2022-10-04 DIAGNOSIS — D0501 Lobular carcinoma in situ of right breast: Secondary | ICD-10-CM

## 2022-10-04 DIAGNOSIS — Z170 Estrogen receptor positive status [ER+]: Secondary | ICD-10-CM | POA: Insufficient documentation

## 2022-10-04 DIAGNOSIS — Z79810 Long term (current) use of selective estrogen receptor modulators (SERMs): Secondary | ICD-10-CM | POA: Insufficient documentation

## 2022-10-04 DIAGNOSIS — D649 Anemia, unspecified: Secondary | ICD-10-CM

## 2022-10-04 DIAGNOSIS — Z1231 Encounter for screening mammogram for malignant neoplasm of breast: Secondary | ICD-10-CM

## 2022-10-04 LAB — CMP (CANCER CENTER ONLY)
ALT: 23 U/L (ref 0–44)
AST: 24 U/L (ref 15–41)
Albumin: 4 g/dL (ref 3.5–5.0)
Alkaline Phosphatase: 34 U/L — ABNORMAL LOW (ref 38–126)
Anion gap: 5 (ref 5–15)
BUN: 17 mg/dL (ref 8–23)
CO2: 31 mmol/L (ref 22–32)
Calcium: 9.4 mg/dL (ref 8.9–10.3)
Chloride: 103 mmol/L (ref 98–111)
Creatinine: 0.77 mg/dL (ref 0.44–1.00)
GFR, Estimated: >60 mL/min (ref >60)
Glucose, Bld: 124 mg/dL — ABNORMAL HIGH (ref 70–99)
Potassium: 3.9 mmol/L (ref 3.5–5.1)
Sodium: 139 mmol/L (ref 135–145)
Total Bilirubin: 0.4 mg/dL (ref 0.3–1.2)
Total Protein: 6.5 g/dL (ref 6.5–8.1)

## 2022-10-04 LAB — CBC WITH DIFFERENTIAL (CANCER CENTER ONLY)
Abs Immature Granulocytes: 0.02 10*3/uL (ref 0.00–0.07)
Basophils Absolute: 0 10*3/uL (ref 0.0–0.1)
Basophils Relative: 0 %
Eosinophils Absolute: 0.2 10*3/uL (ref 0.0–0.5)
Eosinophils Relative: 2 %
HCT: 37.4 % (ref 36.0–46.0)
Hemoglobin: 12.3 g/dL (ref 12.0–15.0)
Immature Granulocytes: 0 %
Lymphocytes Relative: 35 %
Lymphs Abs: 3 10*3/uL (ref 0.7–4.0)
MCH: 25.9 pg — ABNORMAL LOW (ref 26.0–34.0)
MCHC: 32.9 g/dL (ref 30.0–36.0)
MCV: 78.9 fL — ABNORMAL LOW (ref 80.0–100.0)
Monocytes Absolute: 0.6 10*3/uL (ref 0.1–1.0)
Monocytes Relative: 6 %
Neutro Abs: 4.9 10*3/uL (ref 1.7–7.7)
Neutrophils Relative %: 57 %
Platelet Count: 194 10*3/uL (ref 150–400)
RBC: 4.74 MIL/uL (ref 3.87–5.11)
RDW: 15.3 % (ref 11.5–15.5)
WBC Count: 8.7 10*3/uL (ref 4.0–10.5)
nRBC: 0 % (ref 0.0–0.2)

## 2022-10-04 LAB — SAMPLE TO BLOOD BANK

## 2022-10-04 LAB — FERRITIN: Ferritin: 47 ng/mL (ref 11–307)

## 2022-10-04 LAB — IRON AND IRON BINDING CAPACITY (CC-WL,HP ONLY)
Iron: 78 ug/dL (ref 28–170)
Saturation Ratios: 17 % (ref 10.4–31.8)
TIBC: 454 ug/dL — ABNORMAL HIGH (ref 250–450)
UIBC: 376 ug/dL (ref 148–442)

## 2022-10-04 MED ORDER — TAMOXIFEN CITRATE 10 MG PO TABS: 5.0000 mg | ORAL_TABLET | Freq: Every day | ORAL | 3 refills | Status: DC

## 2022-10-04 NOTE — Assessment & Plan Note (Signed)
--  She was initially diagnosed with LCIS in 02/2013. She was treated with right lumpectomy with Dr Barry Dienes and about 3 years of antiestrogen therapy (exemestane and Tamoxifen), which she stopped on her own due to poor tolerance.  -She has recurrence based on 04/19/20 Mammogram and 04/2020 biopsy with high grade 37m of LCIS in her UIQ of right breast with necrosis and calcification, and multifocal foci. -She was treated with Right lumpectomy with Dr BBarry Dieneson 08/02/20. -she started low dose '10mg'$  Tamoxifen daily in 08/2020. Plan to take for 5 years.

## 2022-12-13 DIAGNOSIS — I10 Essential (primary) hypertension: Secondary | ICD-10-CM | POA: Diagnosis not present

## 2022-12-13 DIAGNOSIS — K120 Recurrent oral aphthae: Secondary | ICD-10-CM | POA: Diagnosis not present

## 2022-12-13 DIAGNOSIS — C50911 Malignant neoplasm of unspecified site of right female breast: Secondary | ICD-10-CM | POA: Diagnosis not present

## 2023-01-14 DIAGNOSIS — K219 Gastro-esophageal reflux disease without esophagitis: Secondary | ICD-10-CM | POA: Diagnosis not present

## 2023-01-14 DIAGNOSIS — I359 Nonrheumatic aortic valve disorder, unspecified: Secondary | ICD-10-CM | POA: Diagnosis not present

## 2023-01-14 DIAGNOSIS — C50911 Malignant neoplasm of unspecified site of right female breast: Secondary | ICD-10-CM | POA: Diagnosis not present

## 2023-01-14 DIAGNOSIS — I519 Heart disease, unspecified: Secondary | ICD-10-CM | POA: Diagnosis not present

## 2023-01-14 DIAGNOSIS — I10 Essential (primary) hypertension: Secondary | ICD-10-CM | POA: Diagnosis not present

## 2023-01-14 DIAGNOSIS — R7303 Prediabetes: Secondary | ICD-10-CM | POA: Diagnosis not present

## 2023-01-24 DIAGNOSIS — I10 Essential (primary) hypertension: Secondary | ICD-10-CM | POA: Diagnosis not present

## 2023-04-03 DIAGNOSIS — R600 Localized edema: Secondary | ICD-10-CM | POA: Diagnosis not present

## 2023-04-03 DIAGNOSIS — I519 Heart disease, unspecified: Secondary | ICD-10-CM | POA: Diagnosis not present

## 2023-04-03 DIAGNOSIS — I350 Nonrheumatic aortic (valve) stenosis: Secondary | ICD-10-CM | POA: Diagnosis not present

## 2023-04-03 DIAGNOSIS — I10 Essential (primary) hypertension: Secondary | ICD-10-CM | POA: Diagnosis not present

## 2023-04-03 DIAGNOSIS — D8481 Immunodeficiency due to conditions classified elsewhere: Secondary | ICD-10-CM | POA: Diagnosis not present

## 2023-04-03 DIAGNOSIS — R5383 Other fatigue: Secondary | ICD-10-CM | POA: Diagnosis not present

## 2023-04-03 DIAGNOSIS — C50911 Malignant neoplasm of unspecified site of right female breast: Secondary | ICD-10-CM | POA: Diagnosis not present

## 2023-04-04 DIAGNOSIS — R5383 Other fatigue: Secondary | ICD-10-CM | POA: Diagnosis not present

## 2023-04-09 ENCOUNTER — Telehealth: Payer: Self-pay | Admitting: Cardiology

## 2023-04-09 NOTE — Telephone Encounter (Signed)
Patient requesting to switch from Dr. Antoine Poche to Dr. Servando Salina. She would like to see a female provider. Please advise.

## 2023-04-15 DIAGNOSIS — M810 Age-related osteoporosis without current pathological fracture: Secondary | ICD-10-CM | POA: Diagnosis not present

## 2023-04-15 DIAGNOSIS — K224 Dyskinesia of esophagus: Secondary | ICD-10-CM | POA: Diagnosis not present

## 2023-04-15 DIAGNOSIS — Z008 Encounter for other general examination: Secondary | ICD-10-CM | POA: Diagnosis not present

## 2023-04-15 DIAGNOSIS — Z96649 Presence of unspecified artificial hip joint: Secondary | ICD-10-CM | POA: Diagnosis not present

## 2023-04-15 DIAGNOSIS — G629 Polyneuropathy, unspecified: Secondary | ICD-10-CM | POA: Diagnosis not present

## 2023-04-15 DIAGNOSIS — I10 Essential (primary) hypertension: Secondary | ICD-10-CM | POA: Diagnosis not present

## 2023-04-15 DIAGNOSIS — R011 Cardiac murmur, unspecified: Secondary | ICD-10-CM | POA: Diagnosis not present

## 2023-04-15 DIAGNOSIS — E785 Hyperlipidemia, unspecified: Secondary | ICD-10-CM | POA: Diagnosis not present

## 2023-04-15 DIAGNOSIS — Z8249 Family history of ischemic heart disease and other diseases of the circulatory system: Secondary | ICD-10-CM | POA: Diagnosis not present

## 2023-04-15 DIAGNOSIS — R609 Edema, unspecified: Secondary | ICD-10-CM | POA: Diagnosis not present

## 2023-04-15 DIAGNOSIS — M1990 Unspecified osteoarthritis, unspecified site: Secondary | ICD-10-CM | POA: Diagnosis not present

## 2023-04-15 DIAGNOSIS — C50919 Malignant neoplasm of unspecified site of unspecified female breast: Secondary | ICD-10-CM | POA: Diagnosis not present

## 2023-04-15 DIAGNOSIS — K219 Gastro-esophageal reflux disease without esophagitis: Secondary | ICD-10-CM | POA: Diagnosis not present

## 2023-04-18 ENCOUNTER — Other Ambulatory Visit: Payer: Self-pay | Admitting: Internal Medicine

## 2023-04-18 DIAGNOSIS — Z1231 Encounter for screening mammogram for malignant neoplasm of breast: Secondary | ICD-10-CM

## 2023-04-30 ENCOUNTER — Ambulatory Visit
Admission: RE | Admit: 2023-04-30 | Discharge: 2023-04-30 | Disposition: A | Discharge status: Home / Self Care | Payer: Medicare HMO | Source: Ambulatory Visit | Attending: Internal Medicine | Admitting: Internal Medicine

## 2023-04-30 DIAGNOSIS — Z1231 Encounter for screening mammogram for malignant neoplasm of breast: Secondary | ICD-10-CM

## 2023-05-29 ENCOUNTER — Telehealth (HOSPITAL_COMMUNITY): Payer: Self-pay | Admitting: Cardiology

## 2023-05-29 NOTE — Telephone Encounter (Signed)
Patient did not wish to schedule due to patient following up with Dr. Servando Salina . He states he will wait to see what Dr Servando Salina suggest. Order will be removed from the echo WQ.

## 2023-06-07 DIAGNOSIS — H5203 Hypermetropia, bilateral: Secondary | ICD-10-CM | POA: Diagnosis not present

## 2023-06-07 DIAGNOSIS — H52223 Regular astigmatism, bilateral: Secondary | ICD-10-CM | POA: Diagnosis not present

## 2023-06-07 DIAGNOSIS — H524 Presbyopia: Secondary | ICD-10-CM | POA: Diagnosis not present

## 2023-06-13 ENCOUNTER — Other Ambulatory Visit (HOSPITAL_COMMUNITY): Payer: Self-pay | Admitting: Internal Medicine

## 2023-06-13 DIAGNOSIS — I10 Essential (primary) hypertension: Secondary | ICD-10-CM | POA: Diagnosis not present

## 2023-06-13 DIAGNOSIS — Z8249 Family history of ischemic heart disease and other diseases of the circulatory system: Secondary | ICD-10-CM

## 2023-06-13 DIAGNOSIS — I350 Nonrheumatic aortic (valve) stenosis: Secondary | ICD-10-CM | POA: Diagnosis not present

## 2023-06-13 DIAGNOSIS — G629 Polyneuropathy, unspecified: Secondary | ICD-10-CM | POA: Diagnosis not present

## 2023-06-13 DIAGNOSIS — I519 Heart disease, unspecified: Secondary | ICD-10-CM | POA: Diagnosis not present

## 2023-06-13 DIAGNOSIS — E538 Deficiency of other specified B group vitamins: Secondary | ICD-10-CM | POA: Diagnosis not present

## 2023-06-25 ENCOUNTER — Ambulatory Visit: Payer: Medicare HMO

## 2023-06-26 ENCOUNTER — Ambulatory Visit (HOSPITAL_COMMUNITY)
Admission: RE | Admit: 2023-06-26 | Discharge: 2023-06-26 | Disposition: A | Discharge status: Home / Self Care | Payer: Medicare HMO | Source: Ambulatory Visit | Attending: Internal Medicine | Admitting: Internal Medicine

## 2023-06-26 DIAGNOSIS — Z8249 Family history of ischemic heart disease and other diseases of the circulatory system: Secondary | ICD-10-CM | POA: Insufficient documentation

## 2023-07-05 ENCOUNTER — Telehealth: Payer: Self-pay | Admitting: Hematology

## 2023-07-07 NOTE — Assessment & Plan Note (Deleted)
--  She was initially diagnosed with LCIS in 02/2013. She was treated with right lumpectomy with Dr Donell Beers and about 3 years of antiestrogen therapy (exemestane and Tamoxifen), which she stopped on her own due to poor tolerance.  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 -She was treated with Right lumpectomy with Dr Donell Beers on 08/02/20. -she started low dose 10mg  Tamoxifen daily in 08/2020. -based on TAM-01 trial data, I recommended lower her tamoxifen dose to 5mg  daily until 09/2023, she is tolerating better.

## 2023-07-08 ENCOUNTER — Inpatient Hospital Stay: Payer: Medicare HMO | Admitting: Hematology

## 2023-07-08 DIAGNOSIS — D0501 Lobular carcinoma in situ of right breast: Secondary | ICD-10-CM

## 2023-07-10 ENCOUNTER — Telehealth: Payer: Self-pay | Admitting: Hematology

## 2023-07-10 ENCOUNTER — Encounter: Payer: Self-pay | Admitting: Cardiology

## 2023-07-10 ENCOUNTER — Ambulatory Visit: Payer: Medicare HMO | Attending: Cardiology | Admitting: Cardiology

## 2023-07-10 VITALS — BP 144/94 | HR 71 | Ht 59.0 in | Wt 135.8 lb

## 2023-07-10 DIAGNOSIS — I10 Essential (primary) hypertension: Secondary | ICD-10-CM | POA: Diagnosis not present

## 2023-07-10 DIAGNOSIS — R0609 Other forms of dyspnea: Secondary | ICD-10-CM | POA: Diagnosis not present

## 2023-07-10 DIAGNOSIS — I350 Nonrheumatic aortic (valve) stenosis: Secondary | ICD-10-CM | POA: Diagnosis not present

## 2023-07-10 DIAGNOSIS — Q269 Congenital malformation of great vein, unspecified: Secondary | ICD-10-CM

## 2023-07-10 NOTE — Progress Notes (Signed)
Cardiology Office Note:    Date:  07/10/2023   ID:  Ashley Pratt, Ashley Pratt Aug 15, 1951, MRN [id number]  PCP:  Georgann Housekeeper, MD  Cardiologist:  Thomasene Ripple, DO  Electrophysiologist:  None   Referring MD: Georgann Housekeeper, MD   " I am short pf breat   History of Present Illness:    Ashley Pratt is a 72 y.o. female with a hx of mild aortic stenosis, history of breast cancer, GERD, hypertension, osteoarthritis here today for follow-up visit.  Patient previously followed with Dr. Sarina Ser during her visit and echocardiogram was ordered for 1 year follow-up.  During the visit she denied any shortness of breath.  Her blood pressure was well-controlled.  We talked about her known persistent SVC she had not been having any related symptoms.  Since her visit she has had coronary CT scan does not show any evidence of coronary calcification.  She reports that she is experiencing significant shortness of breath on exertion.  She is concerned about this.  She is here with her husband.  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   BREAST LUMPECTOMY WITH NEEDLE LOCALIZATION Right 05/11/2013   Procedure: RIGHT BREAST NEEDLE LOCALIZATION  LUMPECTOMY;  Surgeon: Almond Lint, MD;  Location: [facility];  Service: General;  Laterality: Right;   BREAST LUMPECTOMY WITH RADIOACTIVE SEED LOCALIZATION Right 08/02/2020   Procedure: RIGHT BREAST LUMPECTOMY WITH RADIOACTIVE SEED  LOCALIZATION;  Surgeon: Almond Lint, MD;  Location: [facility];  Service: General;  Laterality: Right;  RNFA   CHOLECYSTECTOMY     COLONOSCOPY     FOOT OSTEOTOMY     both  feet   TONSILLECTOMY      Current Medications: Current Meds  Medication Sig   losartan (COZAAR) 50 MG tablet Take 1 tablet (50 mg total) by mouth 2 (two) times daily.   Multiple Vitamin (MULTIVITAMIN) capsule Take 1 capsule by mouth daily.   pantoprazole (PROTONIX) 20 MG tablet Take 20 mg by mouth daily.   tamoxifen (NOLVADEX) 10 MG tablet Take 0.5 tablets (5 mg total) by mouth daily.     Allergies:   Aspirin and Penicillins   Social History   Socioeconomic History   Marital status: Married    Spouse name: Not on file   Number of children: 2   Years of education: Not on file   Highest education level: Not on file  Occupational History   Not on file  Tobacco Use   Smoking status: Never   Smokeless tobacco: Never  Substance and Sexual Activity   Alcohol use: No   Drug use: No   Sexual activity: Yes  Other Topics Concern   Not on file  Social History Narrative   Lives with husband.     Social Determinants of Health   Financial Resource Strain: Not on file  Food Insecurity: Not on  file  Transportation Needs: Not on file  Physical Activity: Not on file  Stress: Not on file  Social Connections: Not on file     Family History: The patient's family history includes Hypertension in her brother, mother, and sister; Stroke in her mother.  ROS:   Review of Systems  Constitution: Negative for decreased appetite, fever and weight gain.  HENT: Negative for congestion, ear discharge, hoarse voice and sore throat.   Eyes: Negative for discharge, redness, vision loss in right eye and visual halos.  Cardiovascular: Negative for chest pain, dyspnea on exertion, leg swelling, orthopnea and palpitations.  Respiratory: Negative for cough, hemoptysis, shortness of breath and snoring.    Endocrine: Negative for heat intolerance and polyphagia.  Hematologic/Lymphatic: Negative for bleeding problem. Does not bruise/bleed easily.  Skin: Negative for flushing, nail changes, rash and suspicious lesions.  Musculoskeletal: Negative for arthritis, joint pain, muscle cramps, myalgias, neck pain and stiffness.  Gastrointestinal: Negative for abdominal pain, bowel incontinence, diarrhea and excessive appetite.  Genitourinary: Negative for decreased libido, genital sores and incomplete emptying.  Neurological: Negative for brief paralysis, focal weakness, headaches and loss of balance.  Psychiatric/Behavioral: Negative for altered mental status, depression and suicidal ideas.  Allergic/Immunologic: Negative for HIV exposure and persistent infections.    EKGs/Labs/Other Studies Reviewed:    The following studies were reviewed today:   EKG:  The ekg ordered today demonstrates   Recent Labs: 10/04/2022: ALT 23; BUN 17; Creatinine 0.77; Hemoglobin 12.3; Platelet Count 194; Potassium 3.9; Sodium 139  Recent Lipid Panel No results found for: "CHOL", "TRIG", "HDL", "CHOLHDL", "VLDL", "LDLCALC", "LDLDIRECT"  Physical Exam:    VS:  BP (!) 144/94 (BP Location: Right Arm, Patient Position: Sitting, Cuff Size: Normal)   Pulse 71   Ht 4\' 11"  (1.499 m)   Wt 135 lb 12.8 oz (61.6 kg)   SpO2 97%   BMI 27.43 kg/m     Wt Readings from Last 3 Encounters:  07/10/23 135 lb 12.8 oz (61.6 kg)  10/04/22 137 lb 12.8 oz (62.5 kg)  07/16/22 139 lb 3.2 oz (63.1 kg)     GEN: Well nourished, well developed in no acute distress HEENT: Normal NECK: No JVD; No carotid bruits LYMPHATICS: No lymphadenopathy CARDIAC: S1S2 noted,RRR, no murmurs, rubs, gallops RESPIRATORY:  Clear to auscultation without rales, wheezing or rhonchi  ABDOMEN: Soft, non-tender, non-distended, +bowel sounds, no guarding. EXTREMITIES: No edema, No cyanosis, no clubbing MUSCULOSKELETAL:  No deformity  SKIN: Warm and  dry NEUROLOGIC:  Alert and oriented x 3, non-focal PSYCHIATRIC:  Normal affect, good insight  ASSESSMENT:    1. 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 Nonrheumatic aortic valve stenosis   4. DOE (dyspnea on exertion)    PLAN:     With her shortness of breath and known aortic stenosis we will get a repeat echocardiogram in this patient. Blood pressure in the office today is normal. I repeated to the patient and her husband the coronary calcium score was all normal.  They are aware of her noncardiac results and has been referred to pulmonary and for further testing but her PCP.  The patient is in agreement with the above plan. The patient left the office in stable condition.  The patient will follow up in 6 months or sooner if needed.   Medication Adjustments/Labs and Tests Ordered: Current medicines are reviewed at length with the patient today.  Concerns regarding medicines are outlined above.  Orders Placed This  Encounter  Procedures   EKG 12-Lead   ECHOCARDIOGRAM COMPLETE   No orders of the defined types were placed in this encounter.   Patient Instructions  Medication Instructions:  Your physician recommends that you continue on your current medications as directed. Please refer to the Current Medication list given to you today.    *If you need a refill on your cardiac medications before your next appointment, please call your pharmacy*.   Testing/Procedures: Echo will be scheduled at [address].  Your physician has requested that you have an echocardiogram. Echocardiography is a painless test that uses sound waves to create images of your heart. It provides your doctor with information about the size and shape of your heart and how well your heart's chambers and valves are working. This procedure takes approximately one hour. There are no restrictions for this procedure. Please do NOT wear cologne, perfume, aftershave,  or lotions (deodorant is allowed). Please arrive 15 minutes prior to your appointment time.    Follow-Up: At [facility], you and your health needs are our priority.  As part of our continuing mission to provide you with exceptional heart care, we have created designated Provider Care Teams.  These Care Teams include your primary Cardiologist (physician) and Advanced Practice Providers (APPs -  Physician Assistants and Nurse Practitioners) who all work together to provide you with the care you need, when you need it.  We recommend signing up for the patient portal called "MyChart".  Sign up information is provided on this After Visit Summary.  MyChart is used to connect with patients for Virtual Visits (Telemedicine).  Patients are able to view lab/test results, encounter notes, upcoming appointments, etc.  Non-urgent messages can be sent to your provider as well.   To learn more about what you can do with MyChart, go to [email or web address].    Your next appointment:   6 month(s)  The format for your next appointment:   In Person  Provider:   Thomasene Ripple, DO    Other Instructions    Adopting a Healthy Lifestyle.  Know what a healthy weight is for you (roughly BMI <25) and aim to maintain this   Aim for 7+ servings of fruits and vegetables daily   65-80+ fluid ounces of water or unsweet tea for healthy kidneys   Limit to max 1 drink of alcohol per day; avoid smoking/tobacco   Limit animal fats in diet for cholesterol and heart health - choose grass fed whenever available   Avoid highly processed foods, and foods high in saturated/trans fats   Aim for low stress - take time to unwind and care for your mental health   Aim for 150 min of moderate intensity exercise weekly for heart health, and weights twice weekly for bone health   Aim for 7-9 hours of sleep daily   When it comes to diets, agreement about the perfect plan isnt easy to find, even among the  experts. Experts at the [facility] [employer] developed an idea known as the Healthy Eating Plate. Just imagine a plate divided into logical, healthy portions.   The emphasis is on diet quality:   Load up on vegetables and fruits - one-half of your plate: Aim for color and variety, and remember that potatoes dont count.   Go for whole grains - one-quarter of your plate: Whole wheat, barley, wheat berries, quinoa, oats, brown rice, and foods made with them. If you want pasta, go with  whole wheat pasta.   Protein power - one-quarter of your plate: Fish, chicken, beans, and nuts are all healthy, versatile protein sources. Limit red meat.   The diet, however, does go beyond the plate, offering a few other suggestions.   Use healthy plant oils, such as olive, canola, soy, corn, sunflower and peanut. Check the labels, and avoid partially hydrogenated oil, which have unhealthy trans fats.   If youre thirsty, drink water. Coffee and tea are good in moderation, but skip sugary drinks and limit milk and dairy products to one or two daily servings.   The type of carbohydrate in the diet is more important than the amount. Some sources of carbohydrates, such as vegetables, fruits, whole grains, and beans-are healthier than others.   Finally, stay active  Signed, Thomasene Ripple, DO  07/10/2023 7:02 PM    [facility]

## 2023-07-10 NOTE — Patient Instructions (Addendum)
Medication Instructions:  Your physician recommends that you continue on your current medications as directed. Please refer to the Current Medication list given to you today.    *If you need a refill on your cardiac medications before your next appointment, please call your pharmacy*.   Testing/Procedures: Echo will be scheduled at [address].  Your physician has requested that you have an echocardiogram. Echocardiography is a painless test that uses sound waves to create images of your heart. It provides your doctor with information about the size and shape of your heart and how well your heart's chambers and valves are working. This procedure takes approximately one hour. There are no restrictions for this procedure. Please do NOT wear cologne, perfume, aftershave, or lotions (deodorant is allowed). Please arrive 15 minutes prior to your appointment time.    Follow-Up: At [facility], you and your health needs are our priority.  As part of our continuing mission to provide you with exceptional heart care, we have created designated Provider Care Teams.  These Care Teams include your primary Cardiologist (physician) and Advanced Practice Providers (APPs -  Physician Assistants and Nurse Practitioners) who all work together to provide you with the care you need, when you need it.  We recommend signing up for the patient portal called "MyChart".  Sign up information is provided on this After Visit Summary.  MyChart is used to connect with patients for Virtual Visits (Telemedicine).  Patients are able to view lab/test results, encounter notes, upcoming appointments, etc.  Non-urgent messages can be sent to your provider as well.   To learn more about what you can do with MyChart, go to [email or web address].    Your next appointment:   6 month(s)  The format for your next appointment:   In Person  Provider:   Thomasene Ripple, DO    Other Instructions

## 2023-07-11 ENCOUNTER — Inpatient Hospital Stay: Payer: Medicare HMO | Attending: Hematology | Admitting: Hematology

## 2023-07-11 ENCOUNTER — Inpatient Hospital Stay: Payer: Medicare HMO

## 2023-07-11 ENCOUNTER — Encounter: Payer: Self-pay | Admitting: Hematology

## 2023-07-11 VITALS — BP 160/66 | HR 77 | Temp 98.8°F | Resp 18 | Wt 137.0 lb

## 2023-07-11 DIAGNOSIS — D0501 Lobular carcinoma in situ of right breast: Secondary | ICD-10-CM | POA: Diagnosis not present

## 2023-07-11 DIAGNOSIS — Z79810 Long term (current) use of selective estrogen receptor modulators (SERMs): Secondary | ICD-10-CM | POA: Diagnosis not present

## 2023-07-11 DIAGNOSIS — R918 Other nonspecific abnormal finding of lung field: Secondary | ICD-10-CM | POA: Insufficient documentation

## 2023-07-11 DIAGNOSIS — Z79899 Other long term (current) drug therapy: Secondary | ICD-10-CM | POA: Diagnosis not present

## 2023-07-11 DIAGNOSIS — D649 Anemia, unspecified: Secondary | ICD-10-CM

## 2023-07-11 DIAGNOSIS — M8580 Other specified disorders of bone density and structure, unspecified site: Secondary | ICD-10-CM | POA: Insufficient documentation

## 2023-07-11 LAB — CMP (CANCER CENTER ONLY)
ALT: 20 U/L (ref 0–44)
AST: 21 U/L (ref 15–41)
Albumin: 4 g/dL (ref 3.5–5.0)
Alkaline Phosphatase: 36 U/L — ABNORMAL LOW (ref 38–126)
Anion gap: 5 (ref 5–15)
BUN: 21 mg/dL (ref 8–23)
CO2: 32 mmol/L (ref 22–32)
Calcium: 9.3 mg/dL (ref 8.9–10.3)
Chloride: 102 mmol/L (ref 98–111)
Creatinine: 0.83 mg/dL (ref 0.44–1.00)
GFR, Estimated: >60 mL/min (ref >60)
Glucose, Bld: 125 mg/dL — ABNORMAL HIGH (ref 70–99)
Potassium: 3.7 mmol/L (ref 3.5–5.1)
Sodium: 139 mmol/L (ref 135–145)
Total Bilirubin: 0.4 mg/dL (ref 0.3–1.2)
Total Protein: 6.7 g/dL (ref 6.5–8.1)

## 2023-07-11 LAB — CBC WITH DIFFERENTIAL (CANCER CENTER ONLY)
Abs Immature Granulocytes: 0.01 10*3/uL (ref 0.00–0.07)
Basophils Absolute: 0 10*3/uL (ref 0.0–0.1)
Basophils Relative: 0 %
Eosinophils Absolute: 0.2 10*3/uL (ref 0.0–0.5)
Eosinophils Relative: 3 %
HCT: 38.3 % (ref 36.0–46.0)
Hemoglobin: 12.2 g/dL (ref 12.0–15.0)
Immature Granulocytes: 0 %
Lymphocytes Relative: 34 %
Lymphs Abs: 2.7 10*3/uL (ref 0.7–4.0)
MCH: 25.4 pg — ABNORMAL LOW (ref 26.0–34.0)
MCHC: 31.9 g/dL (ref 30.0–36.0)
MCV: 79.8 fL — ABNORMAL LOW (ref 80.0–100.0)
Monocytes Absolute: 0.5 10*3/uL (ref 0.1–1.0)
Monocytes Relative: 7 %
Neutro Abs: 4.5 10*3/uL (ref 1.7–7.7)
Neutrophils Relative %: 56 %
Platelet Count: 191 10*3/uL (ref 150–400)
RBC: 4.8 MIL/uL (ref 3.87–5.11)
RDW: 14.1 % (ref 11.5–15.5)
WBC Count: 8 10*3/uL (ref 4.0–10.5)
nRBC: 0 % (ref 0.0–0.2)

## 2023-07-11 LAB — IRON AND IRON BINDING CAPACITY (CC-WL,HP ONLY)
Iron: 70 ug/dL (ref 28–170)
Saturation Ratios: 14 % (ref 10.4–31.8)
TIBC: 508 ug/dL — ABNORMAL HIGH (ref 250–450)
UIBC: 438 ug/dL (ref 148–442)

## 2023-07-11 LAB — SAMPLE TO BLOOD BANK

## 2023-07-11 LAB — FERRITIN: Ferritin: 35 ng/mL (ref 11–307)

## 2023-07-11 NOTE — Assessment & Plan Note (Signed)
--  She was initially diagnosed with LCIS in 02/2013. She was treated with right lumpectomy with Dr Donell Beers and about 3 years of antiestrogen therapy (exemestane and Tamoxifen), which she stopped on her own due to poor tolerance.  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 -She was treated with Right lumpectomy with Dr Donell Beers on 08/02/20. -she started low dose 10mg  Tamoxifen daily in 08/2020. -based on TAM-01 trial data, I recommended lower her tamoxifen dose to 5mg  daily until 09/2023, she is tolerating better.

## 2023-07-11 NOTE — Progress Notes (Signed)
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 female with   Breast neoplasm LCIS (right) --She was initially diagnosed with LCIS in 02/2013. She was treated with right lumpectomy with Dr Donell Beers and about 3 years of antiestrogen therapy (exemestane and Tamoxifen), which she stopped on her own due to poor tolerance.  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 -She was treated with Right lumpectomy with Dr Donell Beers on 08/02/20. -she started low dose 10mg  Tamoxifen daily in 08/2020. -based on TAM-01 trial data, I recommended lower her tamoxifen dose to 5mg  daily until 09/2023, she is tolerating better.   Right lung mass -Found calcium deposit score CT scan on June 26, 2023. -She is asymptomatic.  I reviewed her CT scan images with patient and her husband, the 2.3 cm mass in the right lung is highly suspicious for malignancy. -She has been referred to [facility] pulmonary, but I did not see an appointment scheduled.  I will reach out to Dr. Elza Rafter, to see if we can expedite her appointment. -Will see her back if biopsy confirms malignancy.   PLAN: -lab today CBC and CMP -Bone density scan next year w/ mammogram. - Pt continue to take Tamoxifen till 08/2023 -I recommend a bronchoscopy - Referred pt  to [facility] -Laband f/u in 6 months, or sooner if  needed    SUMMARY OF ONCOLOGIC HISTORY: Oncology History Overview Note  Cancer Staging Breast neoplasm LCIS (right) Staging form: Breast, AJCC 7th Edition - Clinical: Stage Unknown (Tis (LCIS), NX, cM0) - Signed by Reece Packer, MD on 07/12/2014 - Pathologic: No stage assigned - Unsigned    Neoplasm of right breast, 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 This measures 2.1 x 0.7 x 2.2 cm.  At the  distal end of this hypoechoic area, there is another hypoechoic  area that may contain calcifications, 6 cm from the right nipple at  11 o'clock measuring 9 x 4 x 6 mm.  Findings are concerning for  possible carcinoma.    03/10/2013 Initial Biopsy   Diagnosis Breast, right, needle core biopsy, 9:30, 8cm/nipple - LOBULAR CARCINOMA IN SITU WITH NECROSIS AND CALCIFICATION, SEE COMMENT. Microscopic Comment There is extensive lobular carcinoma in situ, pleomorphic type, present with associated necrosis and calcification. The presence of the myoepithelial layer was confirmed with p63, smooth muscle mycin heavy chain, and calponin immunostains. The case was reviewed with Dr. Laureen Ochs who concurs. (CRR:caf 03/12/13)   05/11/2013 Surgery   RIGHT BREAST NEEDLE LOCALIZATION  LUMPECTOMY by Dr Donell Beers    05/11/2013 Pathology Results   Diagnosis Breast, lumpectomy, Right - LOBULAR CARCINOMA IN SITU WITH CALCIFICATIONS, PARTIALLY INVOLVING AN INTRADUCTAL PAPILLOMA. -  FIBROCYSTIC CHANGES WITH CALCIFICATIONS. - HEALING BIOPSY SITE. - SEE COMMENT. Microscopic Comment Immunohistochemical stains for smooth muscle myosin, calponin, p63 and cytokeratin AE1/AE3 fail to highlight the presence of invasive carcinoma. A cytokeratin 5/6 stain is negative for the presence of atypical ductal hyperplasia.  The surgical resection margin(s) of the specimen were inked and microscopically evaluated. (JBK:caf 05/14/13)   07/18/2013 Imaging   DEXA  Osteopenia with Lowest T-score -1.7 at [facility] Spine    06/2013 - 03/2016 Anti-estrogen oral therapy   She tried Tamoxifen and Exemestane. She was Intolerant to tamoxifen initially. Cumulative stiffness/ arthralgias from aromasin already resolved. She stopped on her own.    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 This patient is considered osteopenic according to World Health Organization [facility]) criteria. There has been a statistically significant increase in BMD of Lumbar spine and no statistically significant change in left hip since prior exam dated 07/28/2013.   05/09/2020 Mammogram   There are developing grouped calcifications in the anterior third of the upper inner quadrant of the right breast spanning an area of 5 mm. They are indeterminate.    05/13/2020 Relapse/Recurrence   Diagnosis Breast, right, needle core biopsy, UIQ - MAMMARY CARCINOMA IN-SITU WITH NECROSIS AND CALCIFICATIONS - SEE COMMENT Microscopic Comment There is a focus concerning but not definitive for microinvasion. Based on the biopsy, the carcinoma in situ has a pleomorphic pattern, high nuclear grade and measures 0.4 cm in greatest linear extent. E-cadherin is pending and will be reported in an addendum. Dr. Rayetta Pigg reviewed the case and agrees with the above diagnosis. These results were called to [facility] on May 16, 2020.   08/02/2020 Surgery   RIGHT BREAST LUMPECTOMY WITH RADIOACTIVE SEED LOCALIZATION by Dr Donell Beers    08/02/2020 Pathology Results   FINAL MICROSCOPIC DIAGNOSIS:   A. BREAST, RIGHT, LUMPECTOMY:  - Biopsy site.  Lobular carcinoma in  situ.  Usual duct epithelial  hyperplasia.  Fibrocystic change.   B. BREAST, RIGHT, ADDITIONAL POSTERIOR MARGIN, EXCISION:  - Usual duct epithelial hyperplasia.  Fibrocystic change.   COMMENT:   A. The lobular carcinoma in situ observed is not of the pleomorphic  type.   B. E-cadherin and CK 5/6 are positive in usual duct epithelial  hyperplasia.    08/2020 -  Anti-estrogen oral therapy   Tamoxifen 10mg  once daily starting in 08/2020      INTERVAL HISTORY:  Ebelyn Lesner is here for a follow up of  recurrent right breast LCIS. She was last seen by me on 10/04/2022. She presents to the clinic accompanied by husband. PCP is monitoring her BP. Pt is taking los dose Tamoxifen and she has no issues. Pt state that she has some cough. Pt has some SOB    All other systems were reviewed with the patient and are negative.  MEDICAL HISTORY:  Past Medical History:  Diagnosis Date   Aortic stenosis    Arthritis    Breast cancer (HCC)    Chronic back pain    Colon polyps    GERD (gastroesophageal reflux disease)    Hypertension    Malignant neoplasm of right female breast (HCC)    unspecified site of breast   Osteoarthritis    of the knee left worse than right  Prediabetes     SURGICAL HISTORY: Past Surgical History:  Procedure Laterality Date   BREAST EXCISIONAL BIOPSY Right 2014   BREAST LUMPECTOMY Right 05/11/2013   high risk lumpectomy   BREAST LUMPECTOMY Right 2021   LCIS   BREAST LUMPECTOMY WITH NEEDLE LOCALIZATION Right 05/11/2013   Procedure: RIGHT BREAST NEEDLE LOCALIZATION  LUMPECTOMY;  Surgeon: Almond Lint, MD;  Location: [facility];  Service: General;  Laterality: Right;   BREAST LUMPECTOMY WITH RADIOACTIVE SEED LOCALIZATION Right 08/02/2020   Procedure: RIGHT BREAST LUMPECTOMY WITH RADIOACTIVE SEED LOCALIZATION;  Surgeon: Almond Lint, MD;  Location: [facility];  Service: General;  Laterality: Right;  RNFA    CHOLECYSTECTOMY     COLONOSCOPY     FOOT OSTEOTOMY     both  feet   TONSILLECTOMY      I have reviewed the social history and family history with the patient and they are unchanged from previous note.  ALLERGIES:  is allergic to aspirin and penicillins.  MEDICATIONS:  Current Outpatient Medications  Medication Sig Dispense Refill   losartan (COZAAR) 50 MG tablet Take 1 tablet (50 mg total) by mouth 2 (two) times daily. 180 tablet 3   Multiple Vitamin (MULTIVITAMIN) capsule Take 1 capsule by mouth daily.     pantoprazole (PROTONIX) 20 MG tablet Take 20 mg by mouth daily.     tamoxifen (NOLVADEX) 10 MG tablet Take 0.5 tablets (5 mg total) by mouth daily. 45 tablet 3   No current facility-administered medications for this visit.    PHYSICAL EXAMINATION: ECOG PERFORMANCE STATUS: 0 - Asymptomatic  Vitals:   07/11/23 1005  BP: (!) 160/66  Pulse: 77  Resp: 18  Temp: 98.8 F (37.1 C)  SpO2: 99%   Wt Readings from Last 3 Encounters:  07/11/23 137 lb (62.1 kg)  07/10/23 135 lb 12.8 oz (61.6 kg)  10/04/22 137 lb 12.8 oz (62.5 kg)     GENERAL:alert, no distress and comfortable SKIN: skin color normal, no rashes or significant lesions EYES: normal, Conjunctiva are pink and non-injected, sclera clear  NEURO: alert & oriented x 3 with fluent speech NECK:(-) supple, thyroid normal size, non-tender, without nodularity LYMPH:  (-)no palpable lymphadenopathy in the cervical, axillary  LUNGS:(-)  clear to auscultation and percussion with normal breathing effort HEART: (-)regular rate & rhythm and no murmurs and no lower extremity edema ABDOMEN:(-) abdomen soft, (-) non-tender and normal bowel sounds BREAST: RT breast lumpectomy no scar tissue, no palpable mass breast exam benign. LT breast no palpable mass , breast exam benign. LABORATORY DATA:  I have reviewed the data as listed    Latest Ref Rng & Units 10/04/2022   10:48 AM 10/05/2021   12:08 PM 04/05/2021    1:56 PM  CBC  WBC  4.0 - 10.5 K/uL 8.7  8.8  7.9   Hemoglobin 12.0 - 15.0 g/dL 62.1  30.8  65.7   Hematocrit 36.0 - 46.0 % 37.4  36.8  36.7   Platelets 150 - 400 K/uL 194  202  178         Latest Ref Rng & Units 10/04/2022   10:48 AM 10/05/2021   12:08 PM 04/05/2021    1:56 PM  CMP  Glucose 70 - 99 mg/dL 846  962  952   BUN 8 - 23 mg/dL 17  18  17    Creatinine 0.44 - 1.00 mg/dL 8.41  3.24  4.01   Sodium 135 - 145 mmol/L 139  142  141   Potassium 3.5 - 5.1 mmol/L 3.9  3.8  4.3   Chloride 98 - 111 mmol/L 103  107  106   CO2 22 - 32 mmol/L 31  25  26    Calcium 8.9 - 10.3 mg/dL 9.4  8.9  8.7   Total Protein 6.5 - 8.1 g/dL 6.5  6.7  6.5   Total Bilirubin 0.3 - 1.2 mg/dL 0.4  0.3  0.2   Alkaline Phos 38 - 126 U/L 34  53  36   AST 15 - 41 U/L 24  35  22   ALT 0 - 44 U/L 23  47  20       RADIOGRAPHIC STUDIES: I have personally reviewed the radiological images as listed and agreed with the findings in the report. No results found.    No orders of the defined types were placed in this encounter.  All questions were answered. The patient knows to call the clinic with any problems, questions or concerns. No barriers to learning was detected. The total time spent in the appointment was 25 minutes.     Malachy Mood, MD 07/11/2023   Carolin Coy, CMA, am acting as scribe for Malachy Mood, MD.   I have reviewed the above documentation for accuracy and completeness, and I agree with the above.

## 2023-07-12 ENCOUNTER — Other Ambulatory Visit: Payer: Self-pay

## 2023-07-16 ENCOUNTER — Other Ambulatory Visit: Payer: Self-pay

## 2023-07-16 ENCOUNTER — Telehealth: Payer: Self-pay | Admitting: Hematology

## 2023-07-16 NOTE — Telephone Encounter (Signed)
Spoke to patients husband today regarding next appointment.  Patient was to be referred to pulmonologist and would return if biopsy showed malignancy.  The husband requested a female pulmonologist and then asked if the biopsy was for his wife's breast.  I told the patient the biopsy was for her lung.  He questioned whether that was really needed and did not understand that it was a surgical procedure.  He said that his wife had two previous breast biopsies and did not want anymore surgery.  I told him I would let you know that she did not want to proceed with a biopsy.

## 2023-07-17 ENCOUNTER — Telehealth: Payer: Self-pay | Admitting: Pulmonary Disease

## 2023-07-17 NOTE — Telephone Encounter (Signed)
Called [employer] to call patient to schedule appt with Dr. Tonia Brooms. VM has been left for pt to call back.    Livia Snellen, MD; Sheran Luz, RN Robynn Pane, please put on my schedule for the 23rd Thanks BLI

## 2023-07-19 NOTE — Telephone Encounter (Signed)
Patient has been scheduled for 9/23 at 10:30. Nothing further needed.

## 2023-07-22 ENCOUNTER — Institutional Professional Consult (permissible substitution): Payer: Medicare HMO | Admitting: Pulmonary Disease

## 2023-07-22 DIAGNOSIS — K219 Gastro-esophageal reflux disease without esophagitis: Secondary | ICD-10-CM | POA: Insufficient documentation

## 2023-07-22 DIAGNOSIS — R1310 Dysphagia, unspecified: Secondary | ICD-10-CM | POA: Insufficient documentation

## 2023-07-22 DIAGNOSIS — I359 Nonrheumatic aortic valve disorder, unspecified: Secondary | ICD-10-CM | POA: Insufficient documentation

## 2023-07-22 NOTE — Telephone Encounter (Signed)
See other phone note from Dr. Mosetta Putt.

## 2023-07-22 NOTE — Progress Notes (Deleted)
Synopsis: Referred in *** for *** by Georgann Housekeeper, MD  Subjective:   PATIENT ID: Ashley Pratt GENDER: female DOB: 07/27/1951, MRN: [id number]  No chief complaint on file.   HPI  ***  Past Medical History:  Diagnosis Date  . Aortic stenosis   . Arthritis   . Breast cancer (HCC)   . Chronic back pain   . Colon polyps   . GERD (gastroesophageal reflux disease)   . Hypertension   . Malignant neoplasm of right female breast (HCC)    unspecified site of breast  . 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 Prediabetes      Family History  Problem Relation Age of Onset  . Hypertension Mother   . Stroke Mother   . Hypertension Sister   . Hypertension Brother      Past Surgical History:  Procedure Laterality Date  . BREAST EXCISIONAL BIOPSY Right 2014  . BREAST LUMPECTOMY Right 05/11/2013   high risk lumpectomy  . BREAST LUMPECTOMY Right 2021   LCIS  . BREAST LUMPECTOMY WITH NEEDLE LOCALIZATION Right 05/11/2013   Procedure: RIGHT BREAST NEEDLE LOCALIZATION  LUMPECTOMY;  Surgeon: Almond Lint, MD;  Location: [facility];  Service: General;  Laterality: Right;  . BREAST LUMPECTOMY WITH RADIOACTIVE SEED LOCALIZATION Right 08/02/2020   Procedure: RIGHT BREAST LUMPECTOMY WITH RADIOACTIVE SEED LOCALIZATION;  Surgeon: Almond Lint, MD;  Location: [facility];  Service: General;  Laterality: Right;  RNFA  . CHOLECYSTECTOMY    . COLONOSCOPY    . FOOT OSTEOTOMY     both  feet  . TONSILLECTOMY      Social History   Socioeconomic History  . Marital status: Married    Spouse name: Not on file  . Number of children: 2  . Years of education: Not on file  . Highest education level: Not on file  Occupational History  . Not on file  Tobacco Use  . Smoking status: Never  . Smokeless tobacco: Never  Substance and Sexual Activity  . Alcohol use: No  . Drug use: No  . Sexual activity: Yes  Other Topics Concern  . Not on  file  Social History Narrative   Lives with husband.     Social Determinants of Health   Financial Resource Strain: Not on file  Food Insecurity: Not on file  Transportation Needs: Not on file  Physical Activity: Not on file  Stress: Not on file  Social Connections: Not on file  Intimate Partner Violence: Not on file     Allergies  Allergen Reactions  . Aspirin     Gi upset  . Penicillins     Not sure     Outpatient Medications Prior to Visit  Medication Sig Dispense Refill  . losartan (COZAAR) 50 MG tablet Take 1 tablet (50 mg total) by mouth 2 (two) times daily. 180 tablet 3  . Multiple Vitamin (MULTIVITAMIN) capsule Take 1 capsule by mouth daily.    . pantoprazole (PROTONIX) 20 MG tablet Take 20 mg by mouth daily.    . tamoxifen (NOLVADEX) 10 MG tablet Take 0.5 tablets (5 mg total) by mouth daily. 45 tablet 3   No facility-administered medications prior to visit.    ROS   Objective:  Physical Exam   There were no vitals filed for this visit.   on *** LPM *** RA BMI Readings from Last 3 Encounters:  07/11/23 27.67 kg/m  07/10/23 27.43 kg/m  10/04/22 26.91 kg/m   Wt Readings from Last 3 Encounters:  07/11/23 137 lb (62.1 kg)  07/10/23 135 lb 12.8 oz (61.6 kg)  10/04/22 137 lb 12.8 oz (62.5 kg)     CBC    Component Value Date/Time   WBC 8.0 07/11/2023 1024   WBC 9.0 10/17/2015 1322   RBC 4.80 07/11/2023 1024   HGB 12.2 07/11/2023 1024   HGB 12.8 10/17/2015 1322   HCT 38.3 07/11/2023 1024   HCT 40.8 10/17/2015 1322   PLT 191 07/11/2023 1024   PLT 212 10/17/2015 1322   MCV 79.8 (L) 07/11/2023 1024   MCV 79.1 (L) 10/17/2015 1322   MCH 25.4 (L) 07/11/2023 1024   MCHC 31.9 07/11/2023 1024   RDW 14.1 07/11/2023 1024   RDW 14.6 (H) 10/17/2015 1322   LYMPHSABS 2.7 07/11/2023 1024   LYMPHSABS 2.6 10/17/2015 1322   MONOABS 0.5 07/11/2023 1024   MONOABS 0.6 10/17/2015 1322   EOSABS 0.2 07/11/2023 1024   EOSABS 0.3 10/17/2015 1322   BASOSABS 0.0  07/11/2023 1024   BASOSABS 0.0 10/17/2015 1322    ***  Chest Imaging: ***  Pulmonary Functions Testing Results:     No data to display          FeNO: ***  Pathology: ***  Echocardiogram: ***  Heart Catheterization: ***    Assessment & Plan:   No diagnosis found.  Discussion: ***   Current Outpatient Medications:  .  losartan (COZAAR) 50 MG tablet, Take 1 tablet (50 mg total) by mouth 2 (two) times daily., Disp: 180 tablet, Rfl: 3 .  Multiple Vitamin (MULTIVITAMIN) capsule, Take 1 capsule by mouth daily., Disp: , Rfl:  .  pantoprazole (PROTONIX) 20 MG tablet, Take 20 mg by mouth daily., Disp: , Rfl:  .  tamoxifen (NOLVADEX) 10 MG tablet, Take 0.5 tablets (5 mg total) by mouth daily., Disp: 45 tablet, Rfl: 3  I spent *** minutes dedicated to the care of this patient on the date of this encounter to include pre-visit review of records, face-to-face time with the patient discussing conditions above, post visit ordering of testing, clinical documentation with the electronic health record, making appropriate referrals as documented, and communicating necessary findings to members of the patients care team.   Josephine Igo, DO [facility] 07/22/2023 8:29 AM

## 2023-07-23 ENCOUNTER — Telehealth: Payer: Self-pay | Admitting: Pulmonary Disease

## 2023-07-23 NOTE — Telephone Encounter (Signed)
Patient was scheduled 07/22/2023 and did not show. Next available appointment is 08/15/2023. Please advise when to schedule. Patient's husband phone number is [phone].

## 2023-07-24 DIAGNOSIS — I10 Essential (primary) hypertension: Secondary | ICD-10-CM | POA: Diagnosis not present

## 2023-07-24 DIAGNOSIS — R7303 Prediabetes: Secondary | ICD-10-CM | POA: Diagnosis not present

## 2023-07-24 NOTE — Telephone Encounter (Signed)
Patient has been scheduled to see Dr. Delton Coombes on 10/4 at 1:00pm, 30 min slot. Patient and spouse aware of appt time/date/location.

## 2023-07-24 NOTE — Telephone Encounter (Signed)
Dr. Delton Coombes, you have sooner availability but only 15 min slots are open. Would you be ok with this appt being in 15 min slot? Patient may need interpreter.

## 2023-07-24 NOTE — Telephone Encounter (Signed)
I think it would be tough to do in 15 min w interpreter. If possible to juggle any of my other pt's to create a 30 min slot then that would be better. If it can't work, then ok to put her in 15 min

## 2023-07-26 ENCOUNTER — Other Ambulatory Visit (HOSPITAL_COMMUNITY): Payer: Self-pay | Admitting: Internal Medicine

## 2023-07-26 DIAGNOSIS — R918 Other nonspecific abnormal finding of lung field: Secondary | ICD-10-CM

## 2023-07-31 ENCOUNTER — Ambulatory Visit (HOSPITAL_COMMUNITY): Payer: Medicare HMO | Attending: Cardiology

## 2023-07-31 ENCOUNTER — Institutional Professional Consult (permissible substitution): Payer: Medicare HMO | Admitting: Emergency Medicine

## 2023-07-31 DIAGNOSIS — I350 Nonrheumatic aortic (valve) stenosis: Secondary | ICD-10-CM | POA: Diagnosis not present

## 2023-07-31 LAB — ECHOCARDIOGRAM COMPLETE
AR max vel: 1.13 cm2
AV Area VTI: 1.28 cm2
AV Area mean vel: 1.31 cm2
AV Mean grad: 14 mm[Hg]
AV Peak grad: 27.9 mm[Hg]
Ao pk vel: 2.64 m/s
Area-P 1/2: 3.3 cm2
S' Lateral: 2.8 cm

## 2023-08-02 ENCOUNTER — Encounter: Payer: Self-pay | Admitting: Emergency Medicine

## 2023-08-02 ENCOUNTER — Ambulatory Visit: Payer: Medicare HMO | Admitting: Emergency Medicine

## 2023-08-02 VITALS — BP 138/80 | HR 70 | Ht 59.0 in | Wt 137.0 lb

## 2023-08-02 DIAGNOSIS — R911 Solitary pulmonary nodule: Secondary | ICD-10-CM | POA: Diagnosis not present

## 2023-08-02 DIAGNOSIS — D0501 Lobular carcinoma in situ of right breast: Secondary | ICD-10-CM | POA: Diagnosis not present

## 2023-08-02 DIAGNOSIS — I350 Nonrheumatic aortic (valve) stenosis: Secondary | ICD-10-CM | POA: Diagnosis not present

## 2023-08-02 NOTE — Assessment & Plan Note (Signed)
Breast Cancer Recurrence in 2021 treated with right lymphectomy and Tamoxifen. -Continue current treatment plan under the care of oncologist, Dr. Mosetta Putt.

## 2023-08-02 NOTE — Progress Notes (Signed)
Subjective:    Patient ID: Ashley Pratt, female    DOB: 1951-08-08, 72 y.o.   MRN: [id number]  HPI  The patient is a 72 year old never-smoker with a history of breast cancer, hypertension, aortic stenosis, GERD, and prediabetes. The breast cancer was initially treated with a right lumpectomy and approximately three years of anti-estrogen therapy, which was then stopped. The patient experienced a recurrence in 2021, which was treated with a right lymphectomy and tamoxifen.  A recent calcium coronary scoring CT chest revealed a new 2.3 cm irregular mass, raising suspicion for malignancy. The patient reports a daily cough, more pronounced in the morning and at night, but denies hemoptysis. The patient's father had a history of tuberculosis, but the patient denies any personal history of TB or exposure to inhaled irritants.  The patient's history of breast cancer is a concern, as breast cancer can metastasize to the lung.   Review of Systems As per HPI  Past Medical History:  Diagnosis Date   Aortic stenosis    Arthritis    Breast cancer (HCC)    Chronic back pain    Colon polyps    GERD (gastroesophageal reflux disease)    Hypertension    Malignant neoplasm of right female breast (HCC)    unspecified site of breast   Osteoarthritis    of the knee left worse than right   Prediabetes      Family History  Problem Relation Age of Onset   Hypertension Mother    Stroke Mother    Hypertension Sister    Hypertension Brother      Social History   Socioeconomic History   Marital status: Married    Spouse name: Not on file   Number of children: 2   Years of education: Not on file   Highest education level: Not on file  Occupational History   Not on file  Tobacco Use   Smoking status: Never   Smokeless tobacco: Never  Substance and Sexual Activity   Alcohol use: No   Drug use: No   Sexual activity: Yes  Other Topics Concern   Not on file  Social History  Narrative   Lives with husband.     Social Determinants of Health   Financial Resource Strain: Not on file  Food Insecurity: Not on file  Transportation Needs: Not on file  Physical Activity: Not on file  Stress: Not on file  Social Connections: Not on file  Intimate Partner Violence: Not on file     Allergies  Allergen Reactions   Aspirin     Gi upset   Gabapentin     Other Reaction(s): intolerant   Losartan Potassium-Hctz     Other Reaction(s): HCTZ - could not tolerate- losartan ok   Penicillins     Not sure     Outpatient Medications Prior to Visit  Medication Sig Dispense Refill   losartan (COZAAR) 100 MG tablet Take 100 mg by mouth daily.     Multiple Vitamin (MULTIVITAMIN) capsule Take 1 capsule by mouth daily.     pantoprazole (PROTONIX) 20 MG tablet Take 20 mg by mouth daily.     tamoxifen (NOLVADEX) 10 MG tablet Take 0.5 tablets (5 mg total) by mouth daily. 45 tablet 3   No facility-administered medications prior to visit.        Objective:   Physical Exam  Vitals:   08/02/23 1303  BP: 138/80  Pulse: 70  SpO2: 98%  Weight: 137 lb (62.1  kg)  Height: 4\' 11"  (1.499 m)   Gen: Pleasant, well-nourished, in no distress,  normal affect  ENT: No lesions,  mouth clear,  oropharynx clear, no postnasal drip  Neck: No JVD, no stridor  Lungs: No use of accessory muscles, no crackles or wheezing on normal respiration, no wheeze on forced expiration  Cardiovascular: Somewhat irregular with 3/6 systolic murmur, intact second heart sound  Musculoskeletal: No deformities, no cyanosis or clubbing  Neuro: alert, awake, non focal  Skin: Warm, no lesions or rash      Assessment & Plan:   Pulmonary nodule 1 cm or greater in diameter I reviewed the CT chest with her.  I strongly recommended that she schedule navigational bronchoscopy given suspicion regarding the right lung lesion.  She does not want to schedule right now, wants to get the PET scan, consider her  options discussed with family and possibly also Dr. Mosetta Putt  Suspicious Lung Mass New 2.3 cm irregular mass in the right lung found on a coronary calcium scoring CT. Discussed the high suspicion for malignancy, especially in the context of a history of breast cancer. Explained the need for a biopsy via bronchoscopy to confirm diagnosis, and discussed the associated risks, particularly pneumothorax (2% risk). -Schedule PET scan on 08/08/2023. -Consider scheduling bronchoscopy pending results of PET scan and further discussion with patient and oncologist.   Follow-up -Schedule follow-up appointment to discuss results of PET scan and potential bronchoscopy.  Neoplasm of right breast, primary tumor staging category Tis: lobular carcinoma in situ (LCIS) Breast Cancer Recurrence in 2021 treated with right lymphectomy and Tamoxifen. -Continue current treatment plan under the care of oncologist, Dr. Mosetta Putt.  Nonrheumatic aortic valve stenosis Hypertension, Aortic Stenosis, GERD, Prediabetes Chronic conditions, no acute issues discussed in this visit. -Continue current management plans for these conditions.   Levy Pupa, MD, PhD 08/02/2023, 1:31 PM [name] [phone] or if no answer before 7:00PM call [phone] For any issues after 7:00PM please call eLink [phone] Hours

## 2023-08-02 NOTE — Assessment & Plan Note (Signed)
Hypertension, Aortic Stenosis, GERD, Prediabetes Chronic conditions, no acute issues discussed in this visit. -Continue current management plans for these conditions.

## 2023-08-02 NOTE — Assessment & Plan Note (Addendum)
I reviewed the CT chest with her.  I strongly recommended that she schedule navigational bronchoscopy given suspicion regarding the right lung lesion.  She does not want to schedule right now, wants to get the PET scan, consider her options discussed with family and possibly also Dr. Mosetta Putt  Suspicious Lung Mass New 2.3 cm irregular mass in the right lung found on a coronary calcium scoring CT. Discussed the high suspicion for malignancy, especially in the context of a history of breast cancer. Explained the need for a biopsy via bronchoscopy to confirm diagnosis, and discussed the associated risks, particularly pneumothorax (2% risk). -Schedule PET scan on 08/08/2023. -Consider scheduling bronchoscopy pending results of PET scan and further discussion with patient and oncologist.   Follow-up -Schedule follow-up appointment to discuss results of PET scan and potential bronchoscopy.

## 2023-08-02 NOTE — Patient Instructions (Signed)
VISIT SUMMARY:  During your visit, we discussed your recent CT scan which showed a new 2.3 cm irregular mass in your right lung. Given your history of breast cancer, there is a high suspicion that this could be a malignancy. We also discussed your ongoing treatment for breast cancer recurrence and your chronic conditions including hypertension, aortic stenosis, GERD, and prediabetes.  YOUR PLAN:  -SUSPICIOUS LUNG MASS: The new mass in your lung could be a sign of cancer.  Get your PET scan as planned on 10/10.  We recommended a bronchoscopy to evaluate the lung lesion.  Please let us know if you decide you want to proceed with this.  We can discuss further at your next visit.  -BREAST CANCER: You should continue your current treatment plan under the care of your oncologist, Dr. Mosetta Putt, for your breast cancer recurrence.  -HYPERTENSION, AORTIC STENOSIS, GERD, PREDIABETES: These are your long-term health conditions. There were no new issues related to these conditions during this visit, so you should continue with your current treatment plans.  INSTRUCTIONS:  Please get your PET scan 08/08/2023. We will discuss the results at a follow-up appointment, where we will also discuss the possibility of a bronchoscopy procedure.

## 2023-08-08 ENCOUNTER — Encounter (HOSPITAL_COMMUNITY)
Admission: RE | Admit: 2023-08-08 | Discharge: 2023-08-08 | Disposition: A | Discharge status: Home / Self Care | Payer: Medicare HMO | Source: Ambulatory Visit | Attending: Internal Medicine | Admitting: Internal Medicine

## 2023-08-08 DIAGNOSIS — R918 Other nonspecific abnormal finding of lung field: Secondary | ICD-10-CM | POA: Insufficient documentation

## 2023-08-08 LAB — GLUCOSE, CAPILLARY: Glucose-Capillary: 111 mg/dL — ABNORMAL HIGH (ref 70–99)

## 2023-08-08 MED ORDER — FLUDEOXYGLUCOSE F - 18 (FDG) INJECTION
7.1000 | Freq: Once | INTRAVENOUS | Status: AC | PRN
  Administered 2023-08-08: 6.85 via INTRAVENOUS

## 2023-08-15 ENCOUNTER — Telehealth: Payer: Self-pay | Admitting: Emergency Medicine

## 2023-08-15 ENCOUNTER — Ambulatory Visit: Payer: Medicare HMO | Admitting: Emergency Medicine

## 2023-08-15 NOTE — Telephone Encounter (Signed)
Patient needs an office visit with Dr. Delton Coombes ASAP to discuss PET scan results.  Please put her in either a nodule slot or a blocked slot.

## 2023-08-22 NOTE — Telephone Encounter (Signed)
Called the patient's husband to discuss the urgency of setting up a follow-up visit and/or bronchoscopy given her PET scan results.  No answer, left a voicemail and asked him to call us back.  I will try him again

## 2023-08-29 NOTE — Telephone Encounter (Signed)
I tried calling the patient and her husband multiple times, have been unable to reach them.  She needs an office visit with RB or APP as soon as possible to discuss scan findings and plan next steps in her evaluation.  If unable to reach them please send a certified letter telling them to call and set up an office visit

## 2023-08-30 NOTE — Telephone Encounter (Signed)
Dr Delton Coombes- I was able to speak with her spouse (husband, Zulfiqar at [phone]. ) ok per DPR  I advised we need to get her in urgently to discuss the PET  He states that the pt refuses to make appt here now, bc she is fearful of any further procedures or testing and is not interested in anything further  Routing to Dr Delton Coombes to let him know

## 2023-09-02 NOTE — Telephone Encounter (Signed)
Dr. Delton Coombes, please advise on what you would like letter to state?

## 2023-09-02 NOTE — Telephone Encounter (Signed)
Thank you for letting me know.  We should send the certified letter to her address informing her of our recommendations. I guess at that point it is up to the patient and family to decide how they want to proceed.

## 2023-09-03 NOTE — Telephone Encounter (Signed)
The letter should state -  We are writing to inform you of PET scan results from 08/08/2023.  We have attempted to call and also to set up an office visit to review.  Your PET scan shows evidence for a possible lung cancer.  We strongly recommend that you return to our office, or seek similar consultation elsewhere, to discuss options for diagnostic testing and possible treatment.  Thank you

## 2023-09-03 NOTE — Telephone Encounter (Signed)
Letter has been written in both [place] and urdu [place] and sent via certified mail.  Will hold message in triage to ensure f/u.

## 2023-09-05 ENCOUNTER — Telehealth: Payer: Self-pay | Admitting: Emergency Medicine

## 2023-09-05 DIAGNOSIS — I10 Essential (primary) hypertension: Secondary | ICD-10-CM | POA: Diagnosis not present

## 2023-09-05 DIAGNOSIS — R918 Other nonspecific abnormal finding of lung field: Secondary | ICD-10-CM | POA: Diagnosis not present

## 2023-09-05 DIAGNOSIS — N898 Other specified noninflammatory disorders of vagina: Secondary | ICD-10-CM | POA: Diagnosis not present

## 2023-09-05 DIAGNOSIS — C50911 Malignant neoplasm of unspecified site of right female breast: Secondary | ICD-10-CM | POA: Diagnosis not present

## 2023-09-05 NOTE — Telephone Encounter (Signed)
Pt's husband calling. Last appt w/Dr. Delton Coombes viewed the CT scan and recom a bronc. Pt wanted to wait to see their regular Dr. Before making the decision. They did so and  they would like to move fwd. PET has been completed. Please contact husband to advise where we go from here. His # is [phone]

## 2023-09-10 NOTE — Telephone Encounter (Signed)
No answer from Triage in 5 days. Send direct to Dr.

## 2023-09-11 DIAGNOSIS — B3731 Acute candidiasis of vulva and vagina: Secondary | ICD-10-CM | POA: Diagnosis not present

## 2023-09-11 DIAGNOSIS — N898 Other specified noninflammatory disorders of vagina: Secondary | ICD-10-CM | POA: Diagnosis not present

## 2023-09-11 NOTE — Telephone Encounter (Signed)
PT husband calling again. Please call to advise.

## 2023-09-12 NOTE — Telephone Encounter (Signed)
Received signed receipt. Owens Loffler signed on 11/8.  Routing to Dr. Delton Coombes as an Lorain Childes.

## 2023-09-13 NOTE — Telephone Encounter (Signed)
Thank you :)

## 2023-09-13 NOTE — Telephone Encounter (Signed)
She needs an OV with RB. OK to use a 15 minute slot, fill any blocked slot

## 2023-09-18 DIAGNOSIS — E538 Deficiency of other specified B group vitamins: Secondary | ICD-10-CM | POA: Diagnosis not present

## 2023-09-19 NOTE — Telephone Encounter (Signed)
Appt made. NFN

## 2023-09-23 ENCOUNTER — Telehealth: Payer: Self-pay | Admitting: Emergency Medicine

## 2023-09-23 DIAGNOSIS — C50911 Malignant neoplasm of unspecified site of right female breast: Secondary | ICD-10-CM | POA: Diagnosis not present

## 2023-09-23 DIAGNOSIS — R918 Other nonspecific abnormal finding of lung field: Secondary | ICD-10-CM | POA: Diagnosis not present

## 2023-09-23 DIAGNOSIS — R911 Solitary pulmonary nodule: Secondary | ICD-10-CM

## 2023-09-23 NOTE — Telephone Encounter (Signed)
I discussed plans with the patient's husband by phone.  Agree that she needs navigational bronchoscopy and EBUS under general anesthesia.  I will work on getting this set up for 10/07/2023.  She will need a super D CT chest in preparation for that bronchoscopy.  I will order that today as well.

## 2023-09-23 NOTE — Telephone Encounter (Signed)
PT and husband present to the front asking if Dr. Delton Coombes can do the biopsy before the end of the year. Pls call @ [phone]

## 2023-09-23 NOTE — Telephone Encounter (Signed)
Wants her biopsy done before the end of the year

## 2023-09-24 ENCOUNTER — Encounter: Payer: Self-pay | Admitting: Emergency Medicine

## 2023-10-04 ENCOUNTER — Other Ambulatory Visit: Payer: Self-pay

## 2023-10-04 ENCOUNTER — Ambulatory Visit (HOSPITAL_COMMUNITY)
Admission: RE | Admit: 2023-10-04 | Discharge: 2023-10-04 | Disposition: A | Discharge status: Home / Self Care | Payer: Medicare HMO | Source: Ambulatory Visit | Attending: Internal Medicine | Admitting: Internal Medicine

## 2023-10-04 ENCOUNTER — Encounter (HOSPITAL_COMMUNITY): Payer: Self-pay | Admitting: Emergency Medicine

## 2023-10-04 DIAGNOSIS — R590 Localized enlarged lymph nodes: Secondary | ICD-10-CM | POA: Diagnosis not present

## 2023-10-04 DIAGNOSIS — I700 Atherosclerosis of aorta: Secondary | ICD-10-CM | POA: Diagnosis not present

## 2023-10-04 DIAGNOSIS — R911 Solitary pulmonary nodule: Secondary | ICD-10-CM | POA: Insufficient documentation

## 2023-10-04 NOTE — Progress Notes (Signed)
PCP - Georgann Housekeeper, MD Cardiologist - Thomasene Ripple, DO  EKG - 07/10/2023 Chest x-ray - Denies ECHO - 07/31/2023 Cardiac Cath - Denies  Sleep Sudy -Denies  DM - Denies   Blood Thinner Instructions: Denies Aspirin Instructions: Denies  ERAS Protcol - NPO COVID TEST- N/A  Anesthesia review: Yes  -------------  SDW INSTRUCTIONS:  Your procedure is scheduled on Monday Dec 9th. Please report to [facility] Main Entrance "A" at 0530 A.M., and check in at the Admitting office. Call this number if you have problems the morning of surgery: [phone]   Remember: Do not eat or drink after midnight the night before your surgery     Medications to take morning of surgery with a sip of water include: pantoprazole (PROTONIX)    As of today, STOP taking any Aspirin (unless otherwise instructed by your surgeon), Aleve, Naproxen, Ibuprofen, Motrin, Advil, Goody's, BC's, all herbal medications, fish oil, and all vitamins.    The Morning of Surgery Do not wear jewelry, make-up or nail polish. Do not wear lotions, powders, or perfumes/colognes, or deodorant Do not bring valuables to the hospital. [facility] is not responsible for any belongings or valuables.  If you are a smoker, DO NOT Smoke 24 hours prior to surgery  If you wear a CPAP at night please bring your mask the morning of surgery   Remember that you must have someone to transport you home after your surgery, and remain with you for 24 hours if you are discharged the same day.  Please bring cases for contacts, glasses, hearing aids, dentures or bridgework because it cannot be worn into surgery.   Patients discharged the day of surgery will not be allowed to drive home.   Please shower the NIGHT BEFORE/MORNING OF SURGERY (use antibacterial soap like DIAL soap if possible). Wear comfortable clothes the morning of surgery. Oral Hygiene is also important to reduce your risk of infection.  Remember - BRUSH YOUR TEETH THE  MORNING OF SURGERY WITH YOUR REGULAR TOOTHPASTE  Patient denies shortness of breath, fever, cough and chest pain.

## 2023-10-06 NOTE — Assessment & Plan Note (Signed)
--  She was initially diagnosed with LCIS in 02/2013. She was treated with right lumpectomy with Dr Donell Beers and about 3 years of antiestrogen therapy (exemestane and Tamoxifen), which she stopped on her own due to poor tolerance.  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 -She was treated with Right lumpectomy with Dr Donell Beers on 08/02/20. -she started low dose 10mg  Tamoxifen daily in 08/2020. -based on TAM-01 trial data, I recommended lower her tamoxifen dose to 5mg  daily until 09/2023, she is tolerating better.

## 2023-10-06 NOTE — Anesthesia Preprocedure Evaluation (Signed)
Anesthesia Evaluation  Patient identified by MRN, date of birth, ID band Patient awake    Reviewed: Allergy & Precautions, NPO status , Patient's Chart, lab work & pertinent test results  Airway Mallampati: II  TM Distance: >3 FB Neck ROM: Full    Dental no notable dental hx. (+) Teeth Intact, Dental Advisory Given   Pulmonary neg pulmonary ROS R Lung mass   Pulmonary exam normal breath sounds clear to auscultation       Cardiovascular hypertension, Normal cardiovascular exam+ Valvular Problems/Murmurs (Mild AS, Mild MR) MR and AS  Rhythm:Regular Rate:Normal  07/31/2023 TTE 1. Left ventricular ejection fraction, by estimation, is 60 to 65%. The  left ventricle has normal function. The left ventricle has no regional  wall motion abnormalities. There is mild left ventricular hypertrophy.  Left ventricular diastolic parameters  were normal. The average left ventricular global longitudinal strain is  -18.4 %.   2. Right ventricular systolic function is normal. The right ventricular  size is normal. There is normal pulmonary artery systolic pressure. The  estimated right ventricular systolic pressure is 28.2 mmHg.   3. The mitral valve is degenerative. Mild mitral valve regurgitation. No  evidence of mitral stenosis. Moderate mitral annular calcification.   4. The aortic valve is tricuspid. Aortic valve regurgitation is not  visualized. Mild aortic valve stenosis.   5. The inferior vena cava is normal in size with greater than 50%  respiratory variability, suggesting right atrial pressure of 3 mmHg.   6. Rhythm strip during this exam demonstrates normal sinus rhythm.      Neuro/Psych negative neurological ROS  negative psych ROS   GI/Hepatic Neg liver ROS,GERD  ,,  Endo/Other  negative endocrine ROS    Renal/GU      Musculoskeletal  (+) Arthritis ,    Abdominal   Peds  Hematology   Anesthesia Other Findings R  Breast CA  Reproductive/Obstetrics                             Anesthesia Physical Anesthesia Plan  ASA: 3  Anesthesia Plan: General   Post-op Pain Management:    Induction: Intravenous  PONV Risk Score and Plan: Treatment may vary due to age or medical condition, Midazolam and Ondansetron  Airway Management Planned: Oral ETT  Additional Equipment: None  Intra-op Plan:   Post-operative Plan: Extubation in OR  Informed Consent: I have reviewed the patients History and Physical, chart, labs and discussed the procedure including the risks, benefits and alternatives for the proposed anesthesia with the patient or authorized representative who has indicated his/her understanding and acceptance.     Dental advisory given  Plan Discussed with: CRNA and Anesthesiologist  Anesthesia Plan Comments:         Anesthesia Quick Evaluation

## 2023-10-07 ENCOUNTER — Ambulatory Visit (HOSPITAL_COMMUNITY): Payer: Medicare HMO

## 2023-10-07 ENCOUNTER — Encounter (HOSPITAL_COMMUNITY)
Admission: RE | Disposition: A | Discharge status: Home / Self Care | Payer: Self-pay | Source: Home / Self Care | Attending: Emergency Medicine

## 2023-10-07 ENCOUNTER — Inpatient Hospital Stay: Payer: Medicare HMO | Admitting: Hematology

## 2023-10-07 ENCOUNTER — Other Ambulatory Visit: Payer: Self-pay

## 2023-10-07 ENCOUNTER — Ambulatory Visit (HOSPITAL_COMMUNITY): Payer: Self-pay | Admitting: Anesthesiology

## 2023-10-07 ENCOUNTER — Ambulatory Visit (HOSPITAL_COMMUNITY)
Admission: RE | Admit: 2023-10-07 | Discharge: 2023-10-07 | Disposition: A | Discharge status: Home / Self Care | Payer: Medicare HMO | Attending: Emergency Medicine | Admitting: Emergency Medicine

## 2023-10-07 ENCOUNTER — Encounter (HOSPITAL_COMMUNITY): Payer: Self-pay | Admitting: Emergency Medicine

## 2023-10-07 ENCOUNTER — Ambulatory Visit (HOSPITAL_BASED_OUTPATIENT_CLINIC_OR_DEPARTMENT_OTHER): Payer: Self-pay | Admitting: Anesthesiology

## 2023-10-07 ENCOUNTER — Inpatient Hospital Stay: Payer: Medicare HMO

## 2023-10-07 DIAGNOSIS — Z8249 Family history of ischemic heart disease and other diseases of the circulatory system: Secondary | ICD-10-CM | POA: Diagnosis not present

## 2023-10-07 DIAGNOSIS — R846 Abnormal cytological findings in specimens from respiratory organs and thorax: Secondary | ICD-10-CM | POA: Diagnosis not present

## 2023-10-07 DIAGNOSIS — D0501 Lobular carcinoma in situ of right breast: Secondary | ICD-10-CM

## 2023-10-07 DIAGNOSIS — C342 Malignant neoplasm of middle lobe, bronchus or lung: Secondary | ICD-10-CM | POA: Insufficient documentation

## 2023-10-07 DIAGNOSIS — Z48813 Encounter for surgical aftercare following surgery on the respiratory system: Secondary | ICD-10-CM | POA: Diagnosis not present

## 2023-10-07 DIAGNOSIS — R590 Localized enlarged lymph nodes: Secondary | ICD-10-CM | POA: Diagnosis present

## 2023-10-07 DIAGNOSIS — R911 Solitary pulmonary nodule: Secondary | ICD-10-CM

## 2023-10-07 DIAGNOSIS — Z853 Personal history of malignant neoplasm of breast: Secondary | ICD-10-CM | POA: Diagnosis not present

## 2023-10-07 DIAGNOSIS — C771 Secondary and unspecified malignant neoplasm of intrathoracic lymph nodes: Secondary | ICD-10-CM | POA: Insufficient documentation

## 2023-10-07 DIAGNOSIS — K219 Gastro-esophageal reflux disease without esophagitis: Secondary | ICD-10-CM | POA: Diagnosis not present

## 2023-10-07 DIAGNOSIS — I10 Essential (primary) hypertension: Secondary | ICD-10-CM | POA: Insufficient documentation

## 2023-10-07 DIAGNOSIS — M1990 Unspecified osteoarthritis, unspecified site: Secondary | ICD-10-CM | POA: Insufficient documentation

## 2023-10-07 DIAGNOSIS — I080 Rheumatic disorders of both mitral and aortic valves: Secondary | ICD-10-CM | POA: Insufficient documentation

## 2023-10-07 HISTORY — PX: VIDEO BRONCHOSCOPY WITH ENDOBRONCHIAL ULTRASOUND: SHX6177

## 2023-10-07 HISTORY — PX: BRONCHIAL NEEDLE ASPIRATION BIOPSY: SHX5106

## 2023-10-07 HISTORY — PX: BRONCHIAL BIOPSY: SHX5109

## 2023-10-07 HISTORY — PX: BRONCHIAL BRUSHINGS: SHX5108

## 2023-10-07 LAB — BASIC METABOLIC PANEL
Anion gap: 9 (ref 5–15)
BUN: <5 mg/dL — ABNORMAL LOW (ref 8–23)
CO2: 22 mmol/L (ref 22–32)
Calcium: 8.4 mg/dL — ABNORMAL LOW (ref 8.9–10.3)
Chloride: 97 mmol/L — ABNORMAL LOW (ref 98–111)
Creatinine, Ser: 0.75 mg/dL (ref 0.44–1.00)
GFR, Estimated: >60 mL/min (ref >60)
Glucose, Bld: 98 mg/dL (ref 70–99)
Potassium: 3.7 mmol/L (ref 3.5–5.1)
Sodium: 128 mmol/L — ABNORMAL LOW (ref 135–145)

## 2023-10-07 LAB — CBC
HCT: 37.8 % (ref 36.0–46.0)
Hemoglobin: 12.4 g/dL (ref 12.0–15.0)
MCH: 25.4 pg — ABNORMAL LOW (ref 26.0–34.0)
MCHC: 32.8 g/dL (ref 30.0–36.0)
MCV: 77.5 fL — ABNORMAL LOW (ref 80.0–100.0)
Platelets: 188 10*3/uL (ref 150–400)
RBC: 4.88 MIL/uL (ref 3.87–5.11)
RDW: 14.7 % (ref 11.5–15.5)
WBC: 5.1 10*3/uL (ref 4.0–10.5)
nRBC: 0 % (ref 0.0–0.2)

## 2023-10-07 LAB — GLUCOSE, CAPILLARY: Glucose-Capillary: 104 mg/dL — ABNORMAL HIGH (ref 70–99)

## 2023-10-07 SURGERY — BRONCHOSCOPY, WITH BIOPSY USING ELECTROMAGNETIC NAVIGATION
Anesthesia: General

## 2023-10-07 MED ORDER — DEXAMETHASONE SODIUM PHOSPHATE 10 MG/ML IJ SOLN
INTRAMUSCULAR | Status: DC | PRN
  Administered 2023-10-07: 4 mg via INTRAVENOUS

## 2023-10-07 MED ORDER — ONDANSETRON HCL 4 MG/2ML IJ SOLN
INTRAMUSCULAR | Status: DC | PRN
  Administered 2023-10-07: 4 mg via INTRAVENOUS

## 2023-10-07 MED ORDER — PROPOFOL 10 MG/ML IV BOLUS
INTRAVENOUS | Status: DC | PRN
  Administered 2023-10-07: 150 mg via INTRAVENOUS

## 2023-10-07 MED ORDER — LACTATED RINGERS IV SOLN: INTRAVENOUS | Status: DC

## 2023-10-07 MED ORDER — FENTANYL CITRATE (PF) 100 MCG/2ML IJ SOLN: 25.0000 ug | INTRAMUSCULAR | Status: DC | PRN

## 2023-10-07 MED ORDER — CHLORHEXIDINE GLUCONATE 0.12 % MT SOLN
15.0000 mL | Freq: Once | OROMUCOSAL | Status: AC
  Administered 2023-10-07: 15 mL via OROMUCOSAL
  Filled 2023-10-07: qty 15

## 2023-10-07 MED ORDER — ACETAMINOPHEN 10 MG/ML IV SOLN: 1000.0000 mg | Freq: Once | INTRAVENOUS | Status: DC | PRN

## 2023-10-07 MED ORDER — LIDOCAINE 2% (20 MG/ML) 5 ML SYRINGE
INTRAMUSCULAR | Status: DC | PRN
  Administered 2023-10-07: 100 mg via INTRAVENOUS

## 2023-10-07 MED ORDER — ONDANSETRON HCL 4 MG/2ML IJ SOLN: 4.0000 mg | Freq: Once | INTRAMUSCULAR | Status: DC | PRN

## 2023-10-07 MED ORDER — LABETALOL HCL 5 MG/ML IV SOLN
INTRAVENOUS | Status: DC | PRN
  Administered 2023-10-07: 10 mg via INTRAVENOUS

## 2023-10-07 MED ORDER — ROCURONIUM BROMIDE 10 MG/ML (PF) SYRINGE
PREFILLED_SYRINGE | INTRAVENOUS | Status: DC | PRN
  Administered 2023-10-07 (×2): 40 mg via INTRAVENOUS

## 2023-10-07 MED ORDER — SUGAMMADEX SODIUM 200 MG/2ML IV SOLN
INTRAVENOUS | Status: DC | PRN
  Administered 2023-10-07: 200 mg via INTRAVENOUS

## 2023-10-07 NOTE — Progress Notes (Signed)
Dr Delton Coombes read Chest xray and pt ok to discharge.

## 2023-10-07 NOTE — Progress Notes (Signed)
Patient understands English well and no interpreters available in person or via AMN.

## 2023-10-07 NOTE — H&P (Signed)
Ashley Pratt is an 72 y.o. adult.   Chief Complaint: Right middle lobe nodule, mediastinal adenopathy HPI: 72 year old woman, never smoker with history of breast cancer, hypertension, AS, GERD.  She has a newly identified 2.3 cm irregular right middle lobe mass with mediastinal adenopathy.  Suspicious for either recurrent or primary lung cancer.  The area was hypermetabolic on PET scan.  She presents now for bronchoscopy and a tissue diagnosis.  Denies any new issues, cough, dyspnea.  She feels well.  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   BREAST LUMPECTOMY WITH NEEDLE LOCALIZATION Right 05/11/2013   Procedure: RIGHT BREAST NEEDLE LOCALIZATION  LUMPECTOMY;  Surgeon: Almond Lint, MD;  Location: [facility];  Service: General;  Laterality: Right;   BREAST LUMPECTOMY WITH RADIOACTIVE SEED LOCALIZATION Right 08/02/2020   Procedure: RIGHT BREAST LUMPECTOMY WITH RADIOACTIVE SEED LOCALIZATION;  Surgeon: Almond Lint, MD;  Location: [facility];  Service: General;  Laterality: Right;  RNFA   CHOLECYSTECTOMY     COLONOSCOPY     FOOT OSTEOTOMY     both  feet   TONSILLECTOMY      Family History  Problem Relation Age of Onset   Hypertension Mother    Stroke Mother    Hypertension Sister    Hypertension Brother    Social History:  reports that he has never smoked. He has never used smokeless tobacco. He reports that he does not drink alcohol and does not use  drugs.  Allergies:  Allergies  Allergen Reactions   Aspirin     Gi upset   Gabapentin     Other Reaction(s): intolerant   Losartan Potassium-Hctz     Other Reaction(s): HCTZ - could not tolerate- losartan ok   Penicillins     Not sure    Medications Prior to Admission  Medication Sig Dispense Refill   losartan (COZAAR) 100 MG tablet Take 100 mg by mouth daily.     Multiple Vitamin (MULTIVITAMIN) capsule Take 1 capsule by mouth daily.     pantoprazole (PROTONIX) 20 MG tablet Take 20 mg by mouth daily.     tamoxifen (NOLVADEX) 10 MG tablet Take 0.5 tablets (5 mg total) by mouth daily. 45 tablet 3    Results for orders placed or performed during the hospital encounter of 10/07/23 (from the past 48 hour(s))  CBC per protocol     Status: Abnormal   Collection Time: 10/07/23  5:50 AM  Result Value Ref Range   WBC 5.1 4.0 - 10.5 K/uL   RBC 4.88 3.87 - 5.11 MIL/uL   Hemoglobin 12.4 12.0 - 15.0 g/dL   HCT 16.1 09.6 - 04.5 %   MCV 77.5 (L) 80.0 - 100.0 fL   MCH 25.4 (L) 26.0 - 34.0 pg   MCHC 32.8 30.0 - 36.0 g/dL   RDW 40.9 81.1 - 91.4 %   Platelets  188 150 - 400 K/uL   nRBC 0.0 0.0 - 0.2 %    Comment: Performed at [facility], [address], [place], [place] [address]   No results found.  Review of Systems  Blood pressure (!) 183/68, temperature 98.1 F (36.7 C), resp. rate 18, height 5' (1.524 m), weight 59.9 kg, SpO2 98%. Physical Exam  Gen: Pleasant, well-nourished, in no distress,  normal affect  ENT: No lesions,  mouth clear,  oropharynx clear, no postnasal drip  Neck: No JVD, no stridor  Lungs: No use of accessory muscles, no crackles or wheezing on normal respiration, no wheeze on forced expiration  Cardiovascular: RRR, heart sounds normal, no murmur or gallops, no peripheral edema  Abdomen: soft and NT, no HSM,  BS normal  Musculoskeletal: No deformities, no cyanosis or clubbing  Neuro: alert, awake, non focal  Skin: Warm, no lesions or  rashes    Assessment/Plan Right lower lobe nodule, suspicious for malignancy and associated with mediastinal adenopathy.  Plan is for robotic assisted navigational bronchoscopy and endobronchial ultrasound for tissue sampling.  Patient understands the risks, benefits, rationale and agrees to proceed.  No barriers identified.  Leslye Peer, MD 10/07/2023, 7:24 AM

## 2023-10-07 NOTE — Anesthesia Procedure Notes (Signed)
Procedure Name: Intubation Date/Time: 10/07/2023 7:47 AM  Performed by: Sandie Ano, CRNAPre-anesthesia Checklist: Patient identified, Emergency Drugs available, Suction available and Patient being monitored Patient Re-evaluated:Patient Re-evaluated prior to induction Oxygen Delivery Method: Circle System Utilized Preoxygenation: Pre-oxygenation with 100% oxygen Induction Type: IV induction Ventilation: Mask ventilation without difficulty Laryngoscope Size: Mac and 3 Grade View: Grade II Tube type: Oral Tube size: 7.5 mm Number of attempts: 1 Airway Equipment and Method: Stylet and Oral airway Placement Confirmation: ETT inserted through vocal cords under direct vision, positive ETCO2 and breath sounds checked- equal and bilateral Secured at: 24 cm Tube secured with: Tape Dental Injury: Teeth and Oropharynx as per pre-operative assessment  Comments: CRNA attempt x1 Grade 2b view. MDA attempted x1 Success.

## 2023-10-07 NOTE — Op Note (Signed)
Video Bronchoscopy with Robotic Assisted Bronchoscopic Navigation and Endobronchial Ultrasound Procedure Note  Date of Operation: 10/07/2023   Pre-op Diagnosis: Right middle lobe nodule, mediastinal adenopathy  Post-op Diagnosis: Same  Surgeon: Levy Pupa  Assistants: None  Anesthesia: General endotracheal anesthesia  Operation: Flexible video fiberoptic bronchoscopy with robotic assistance and biopsies.  Estimated Blood Loss: Minimal  Complications: None  Indications and History: Ashley Pratt is a 72 y.o. adult with history of breast cancer.  She is a never smoker.  She was found to have a new irregular right lower lobe nodule and associated mediastinal adenopathy on chest imaging.  Recommendation made to proceed with tissue diagnosis via robotic assisted navigational bronchoscopy and endobronchial ultrasound with biopsies. The risks, benefits, complications, treatment options and expected outcomes were discussed with the patient.  The possibilities of pneumothorax, pneumonia, reaction to medication, pulmonary aspiration, perforation of a viscus, bleeding, failure to diagnose a condition and creating a complication requiring transfusion or operation were discussed with the patient who freely signed the consent.    Description of Procedure: The patient was seen in the Preoperative Area, was examined and was deemed appropriate to proceed.  The patient was taken to [facility] endoscopy room where he, identified as Ashley Pratt and the procedure verified as Flexible Video Fiberoptic Bronchoscopy.  A Time Out was held and the above information confirmed.   Prior to the date of the procedure a high-resolution CT scan of the chest was performed. Utilizing ION software program a virtual tracheobronchial tree was generated to allow the creation of distinct navigation pathways to the patient's parenchymal abnormalities. After being taken to the operating room general anesthesia was  initiated and the patient  was orally intubated. The video fiberoptic bronchoscope was introduced via the endotracheal tube and a general inspection was performed which showed normal right and left lung anatomy. Aspiration of the bilateral mainstems was completed to remove any remaining secretions. Robotic catheter inserted into patient's endotracheal tube.   Target #1 right middle lobe nodule: Targeting software mis-recognized the patient's patulous esophagus as the trachea, and a specific airway tree could not be created.  Therefore a Demo scan was used to create an approximate tracheobronchial tree to allow airway registration.  Once this was accomplished, navigation was performed to the region of the right middle lobe with the nodular abnormality and Cios three-dimensional imaging was performed to localize and target the right middle lobe nodule.  Under fluoroscopic guidance transbronchial needle brushings, transbronchial needle biopsies, and transbronchial forceps biopsies were performed to be sent for cytology and pathology.   The robotic scope was then withdrawn and the endobronchial ultrasound was used to identify and characterize the peritracheal, hilar and bronchial lymph nodes. Inspection showed enlargement at station 4R and station 7. Using real-time ultrasound guidance Wang needle biopsies were take from Station 4R and 7 nodes and were sent for cytology.   At the end of the procedure a general airway inspection was performed and there was no evidence of active bleeding. The bronchoscope was removed.  The patient tolerated the procedure well. There was no significant blood loss and there were no obvious complications. A post-procedural chest x-ray is pending.  Samples Target #1: 1. Transbronchial needle brushings from right middle lobe nodule 2. Transbronchial Wang needle biopsies from right middle lobe nodule 3. Transbronchial forceps biopsies from right middle lobe nodule  EBUS  samples: 1. Wang needle biopsies from 4R node 2. Wang needle biopsies from 7 node  Plans:  The patient will  be discharged from the PACU to home when recovered from anesthesia and after chest x-ray is reviewed. We will review the cytology, pathology and microbiology results with the patient when they become available. Outpatient followup will be with Dr. Delton Coombes and Saralyn Pilar, NP.    Levy Pupa, MD, PhD 10/07/2023, 8:47 AM [facility] [phone] or if no answer before 7:00PM call [phone] For any issues after 7:00PM please call eLink [phone]

## 2023-10-07 NOTE — Transfer of Care (Signed)
Immediate Anesthesia Transfer of Care Note  Patient: Ashley Pratt  Procedure(s) Performed: ROBOTIC ASSISTED NAVIGATIONAL BRONCHOSCOPY VIDEO BRONCHOSCOPY WITH ENDOBRONCHIAL ULTRASOUND BRONCHIAL NEEDLE ASPIRATION BIOPSIES BRONCHIAL BRUSHINGS BRONCHIAL BIOPSIES  Patient Location: PACU  Anesthesia Type:General  Level of Consciousness: awake, alert , and oriented  Airway & Oxygen Therapy: Patient Spontanous Breathing and Patient connected to nasal cannula oxygen  Post-op Assessment: Report given to RN and Post -op Vital signs reviewed and stable  Post vital signs: Reviewed and stable  Last Vitals:  Vitals Value Taken Time  BP 145/72 10/07/23 0852  Temp    Pulse 74 10/07/23 0854  Resp 16 10/07/23 0854  SpO2 99 % 10/07/23 0854  Vitals shown include unfiled device data.  Last Pain:  Vitals:   10/07/23 0628  PainSc: 0-No pain         Complications: No notable events documented.

## 2023-10-07 NOTE — Anesthesia Postprocedure Evaluation (Signed)
Anesthesia Post Note  Patient: Melisa Colgan Priore  Procedure(s) Performed: ROBOTIC ASSISTED NAVIGATIONAL BRONCHOSCOPY VIDEO BRONCHOSCOPY WITH ENDOBRONCHIAL ULTRASOUND BRONCHIAL NEEDLE ASPIRATION BIOPSIES BRONCHIAL BRUSHINGS BRONCHIAL BIOPSIES     Patient location during evaluation: PACU Anesthesia Type: General Level of consciousness: awake and alert Pain management: pain level controlled Vital Signs Assessment: post-procedure vital signs reviewed and stable Respiratory status: spontaneous breathing, nonlabored ventilation, respiratory function stable and patient connected to nasal cannula oxygen Cardiovascular status: blood pressure returned to baseline and stable Postop Assessment: no apparent nausea or vomiting Anesthetic complications: no   No notable events documented.  Last Vitals:  Vitals:   10/07/23 0915 10/07/23 0930  BP: (!) 158/69 (!) 168/80  Pulse: 71 71  Resp: 15 15  Temp:  37.1 C  SpO2: 100% 98%    Last Pain:  Vitals:   10/07/23 0930  PainSc: 0-No pain                 Trevor Iha

## 2023-10-07 NOTE — Discharge Instructions (Addendum)
Flexible Bronchoscopy, Care After This sheet gives you information about how to care for yourself after your test. Your doctor may also give you more specific instructions. If you have problems or questions, contact your doctor. Follow these instructions at home: Eating and drinking When your numbness is gone and your cough and gag reflexes have come back, you may: Eat only soft foods. Slowly drink liquids. When you get home from the test, go back to your normal diet. Driving Do not drive for 24 hours if you were given a medicine to help you relax (sedative). Do not drive or use heavy machinery while taking prescription pain medicine. General instructions  Take over-the-counter and prescription medicines only as told by your doctor. Return to your normal activities as told. Ask what activities are safe for you. Do not use any products that have nicotine or tobacco in them. This includes cigarettes and e-cigarettes. If you need help quitting, ask your doctor. Keep all follow-up visits as told by your doctor. This is important. It is very important if you had a tissue sample (biopsy) taken. Get help right away if: You have shortness of breath that gets worse. You get light-headed. You feel like you are going to pass out (faint). You have chest pain. You cough up: More than a little blood. More blood than before. Summary Do not eat or drink anything (not even water) for 2 hours after your test, or until your numbing medicine wears off. Do not use cigarettes. Do not use e-cigarettes. Get help right away if you have chest pain.  Please call our office for any questions or concerns.  [phone].  This information is not intended to replace advice given to you by your health care provider. Make sure you discuss any questions you have with your health care provider. Document Released: 08/12/2009 Document Revised: 09/27/2017 Document Reviewed: 11/02/2016 Elsevier Patient Education  2020  [employer].

## 2023-10-08 ENCOUNTER — Encounter (HOSPITAL_COMMUNITY): Payer: Self-pay | Admitting: Emergency Medicine

## 2023-10-10 ENCOUNTER — Other Ambulatory Visit: Payer: Self-pay

## 2023-10-10 ENCOUNTER — Inpatient Hospital Stay: Payer: Medicare HMO | Attending: Hematology | Admitting: Hematology

## 2023-10-10 VITALS — BP 174/64 | HR 64 | Temp 97.3°F | Resp 15 | Wt 135.5 lb

## 2023-10-10 DIAGNOSIS — K219 Gastro-esophageal reflux disease without esophagitis: Secondary | ICD-10-CM | POA: Diagnosis not present

## 2023-10-10 DIAGNOSIS — R590 Localized enlarged lymph nodes: Secondary | ICD-10-CM | POA: Insufficient documentation

## 2023-10-10 DIAGNOSIS — D0501 Lobular carcinoma in situ of right breast: Secondary | ICD-10-CM | POA: Insufficient documentation

## 2023-10-10 DIAGNOSIS — Z5111 Encounter for antineoplastic chemotherapy: Secondary | ICD-10-CM | POA: Insufficient documentation

## 2023-10-10 DIAGNOSIS — Z79899 Other long term (current) drug therapy: Secondary | ICD-10-CM | POA: Insufficient documentation

## 2023-10-10 DIAGNOSIS — M1990 Unspecified osteoarthritis, unspecified site: Secondary | ICD-10-CM | POA: Insufficient documentation

## 2023-10-10 DIAGNOSIS — C342 Malignant neoplasm of middle lobe, bronchus or lung: Secondary | ICD-10-CM | POA: Insufficient documentation

## 2023-10-10 DIAGNOSIS — C3490 Malignant neoplasm of unspecified part of unspecified bronchus or lung: Secondary | ICD-10-CM

## 2023-10-10 DIAGNOSIS — I119 Hypertensive heart disease without heart failure: Secondary | ICD-10-CM | POA: Insufficient documentation

## 2023-10-10 DIAGNOSIS — C3491 Malignant neoplasm of unspecified part of right bronchus or lung: Secondary | ICD-10-CM | POA: Diagnosis not present

## 2023-10-10 DIAGNOSIS — R7303 Prediabetes: Secondary | ICD-10-CM | POA: Diagnosis not present

## 2023-10-10 LAB — CYTOLOGY - NON PAP

## 2023-10-10 NOTE — Progress Notes (Signed)
[facility]   [phone] [phone]   Clinic Follow up Note   Patient Care Team: Georgann Housekeeper, MD as PCP - General (Internal Medicine) Charna Elizabeth, MD as PCP - Gastroenterology (Gastroenterology) Thomasene Ripple, DO as PCP - Cardiology (Cardiology) Almond Lint, MD as Consulting Physician (General Surgery)  Date of Service:  10/10/2023  CHIEF COMPLAINT: f/u of biopsy results  CURRENT THERAPY:  Pending concurrent chemoradiation  Assessment and Plan    Lung Adenocarcinoma, Stage III Confirmed diagnosis based on biopsy. PET scan shows localized cancer in lung and positive mediastinal and bilateral hilar adenopathy, stage III, with no metastasis outside the chest. Brain MRI planned due to high risk of brain metastasis. Treatment involves concurrent chemotherapy and radiation therapy. Discussed benefits and risks, including fatigue, nausea, neuropathy, neutropenia and hair loss etc. Emphasized prompt treatment to prevent metastasis. Cure rate for stage III is approximately 40% with current treatments. Patient expressed concerns about pain but agreed to proceed.  -Patient is never smoker, will request foundation 1 or her biopsy, to see if she is a candidate for targeted therapy.  Will also request PD-L1 expression  - Order brain MRI - Refer to Dr. Jerolyn Center for chemotherapy - Refer to radiation oncologist (Dr. Kathrynn Running or Dr. Mitzi Hansen) - Order repeat CT chest, abdomen, and pelvis with contrast since last scan was 2 months ago   Breast neoplasm LCIS (right) --She was initially diagnosed with LCIS in 02/2013. She was treated with right lumpectomy with Dr Donell Beers and about 3 years of antiestrogen therapy (exemestane and Tamoxifen), which she stopped on her own due to poor tolerance.  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 -She was  treated with Right lumpectomy with Dr Donell Beers on 08/02/20. -she started low dose 10mg  Tamoxifen daily in 08/2020. -based on TAM-01 trial data, I recommended lower her tamoxifen dose to 5mg  daily until 09/2023, she has completed.  -Continue annual mammogram.  Plan -I reviewed her biopsy results and treatment plan - Schedule follow-up appointments with Dr. Jerolyn Center and radiation oncologist - Schedule brain MRI and CT scans -Will repeat CT chest, abdomen pelvis with contrast as a new baseline -I have requested NexGen sequencing foundation one and PD-L1 on her biopsy sample. -I will see her as needed      SUMMARY OF ONCOLOGIC HISTORY: Oncology History Overview Note  Cancer Staging Breast neoplasm LCIS (right) Staging form: Breast, AJCC 7th Edition - Clinical: Stage Unknown (Tis (LCIS), NX, cM0) - Signed by Reece Packer, MD on 07/12/2014 - Pathologic: No stage assigned - Unsigned    Neoplasm of right breast, 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 This measures 2.1 x 0.7 x 2.2 cm.  At the  distal end of this hypoechoic area, there is another hypoechoic  area that may contain calcifications, 6 cm from the right nipple at  11 o'clock measuring 9 x 4 x 6 mm.  Findings are concerning for  possible carcinoma.    03/10/2013 Initial Biopsy   Diagnosis Breast, right, needle core biopsy, 9:30, 8cm/nipple - LOBULAR CARCINOMA IN SITU WITH NECROSIS AND CALCIFICATION, SEE COMMENT. Microscopic Comment There is extensive lobular carcinoma in situ, pleomorphic type,  present with associated necrosis and calcification. The presence of the myoepithelial layer was confirmed with p63, smooth muscle mycin heavy chain, and calponin immunostains. The case was reviewed with Dr. Laureen Ochs who concurs.  (CRR:caf 03/12/13)   05/11/2013 Surgery   RIGHT BREAST NEEDLE LOCALIZATION  LUMPECTOMY by Dr Donell Beers    05/11/2013 Pathology Results   Diagnosis Breast, lumpectomy, Right - LOBULAR CARCINOMA IN SITU WITH CALCIFICATIONS, PARTIALLY INVOLVING AN INTRADUCTAL PAPILLOMA. - FIBROCYSTIC CHANGES WITH CALCIFICATIONS. - HEALING BIOPSY SITE. - SEE COMMENT. Microscopic Comment Immunohistochemical stains for smooth muscle myosin, calponin, p63 and cytokeratin AE1/AE3 fail to highlight the presence of invasive carcinoma. A cytokeratin 5/6 stain is negative for the presence of atypical ductal hyperplasia. The surgical resection margin(s) of the specimen were inked and microscopically evaluated. (JBK:caf 05/14/13)   07/18/2013 Imaging   DEXA  Osteopenia with Lowest T-score -1.7 at [facility]    06/2013 - 03/2016 Anti-estrogen oral therapy   She tried Tamoxifen and Exemestane. She was Intolerant to tamoxifen initially. Cumulative stiffness/ arthralgias from aromasin already resolved. She stopped on her own.    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 This patient is considered osteopenic according to World Health Organization [facility]) criteria. There has been a statistically significant increase in BMD of Lumbar spine and no statistically significant change in left hip since prior exam dated 07/28/2013.   05/09/2020 Mammogram   There are developing grouped calcifications in the anterior third of the upper inner quadrant of the right breast spanning an area of 5 mm. They are indeterminate.    05/13/2020 Relapse/Recurrence   Diagnosis Breast, right, needle core biopsy, UIQ - MAMMARY CARCINOMA IN-SITU WITH NECROSIS AND CALCIFICATIONS - SEE COMMENT Microscopic Comment There is a focus concerning but not  definitive for microinvasion. Based on the biopsy, the carcinoma in situ has a pleomorphic pattern, high nuclear grade and measures 0.4 cm in greatest linear extent. E-cadherin is pending and will be reported in an addendum. Dr. Rayetta Pigg reviewed the case and agrees with the above diagnosis. These results were called to [facility] on May 16, 2020.   08/02/2020 Surgery   RIGHT BREAST LUMPECTOMY WITH RADIOACTIVE SEED LOCALIZATION by Dr Donell Beers    08/02/2020 Pathology Results   FINAL MICROSCOPIC DIAGNOSIS:   A. BREAST, RIGHT, LUMPECTOMY:  - Biopsy site.  Lobular carcinoma in situ.  Usual duct epithelial  hyperplasia.  Fibrocystic change.   B. BREAST, RIGHT, ADDITIONAL POSTERIOR MARGIN, EXCISION:  - Usual duct epithelial hyperplasia.  Fibrocystic change.   COMMENT:   A. The lobular carcinoma in situ observed is not of the pleomorphic  type.   B. E-cadherin and CK 5/6 are positive in usual duct epithelial  hyperplasia.    08/2020 -  Anti-estrogen oral therapy   Tamoxifen 10mg  once daily starting in 08/2020      Discussed the use of AI scribe software for clinical note transcription with the patient, who gave verbal consent to proceed.  History of Present Illness   A 72 year old female patient presents for follow-up after a recent lung biopsy. The patient reports feeling scared but denies any cough, chest discomfort, or shortness of breath. She has occasional coughing but does not cough up anything. She denies any difficulty with eating. The patient underwent a lung biopsy  which revealed lung adenocarcinoma. The patient has never smoked or consumed alcohol. She has been experiencing confusion, which has been concerning for her and her family. The patient has been following a diet devoid of meat and milk and has been taking herbal supplements. She has also been avoiding sweet and spicy foods.         All other systems were reviewed with the patient and are  negative.  MEDICAL HISTORY:  Past Medical History:  Diagnosis Date   Aortic stenosis    Arthritis    Breast cancer (HCC)    Chronic back pain    Colon polyps    GERD (gastroesophageal reflux disease)    Hypertension    Malignant neoplasm of right female breast (HCC)    unspecified site of breast   Osteoarthritis    of the knee left worse than right   Prediabetes     SURGICAL HISTORY: Past Surgical History:  Procedure Laterality Date   BREAST EXCISIONAL BIOPSY Right 2014   BREAST LUMPECTOMY Right 05/11/2013   high risk lumpectomy   BREAST LUMPECTOMY Right 2021   LCIS   BREAST LUMPECTOMY WITH NEEDLE LOCALIZATION Right 05/11/2013   Procedure: RIGHT BREAST NEEDLE LOCALIZATION  LUMPECTOMY;  Surgeon: Almond Lint, MD;  Location: [facility];  Service: General;  Laterality: Right;   BREAST LUMPECTOMY WITH RADIOACTIVE SEED LOCALIZATION Right 08/02/2020   Procedure: RIGHT BREAST LUMPECTOMY WITH RADIOACTIVE SEED LOCALIZATION;  Surgeon: Almond Lint, MD;  Location: [facility];  Service: General;  Laterality: Right;  RNFA   BRONCHIAL BIOPSY  10/07/2023   Procedure: BRONCHIAL BIOPSIES;  Surgeon: Leslye Peer, MD;  Location: [facility];  Service: Pulmonary;;   BRONCHIAL BRUSHINGS  10/07/2023   Procedure: BRONCHIAL BRUSHINGS;  Surgeon: Leslye Peer, MD;  Location[facility];  Service: Pulmonary;;   BRONCHIAL NEEDLE ASPIRATION BIOPSY  10/07/2023   Procedure: BRONCHIAL NEEDLE ASPIRATION BIOPSIES;  Surgeon: Leslye Peer, MD;  Location: [facility];  Service: Pulmonary;;   CHOLECYSTECTOMY     COLONOSCOPY     FOOT OSTEOTOMY     both  feet   TONSILLECTOMY     VIDEO BRONCHOSCOPY WITH ENDOBRONCHIAL ULTRASOUND N/A 10/07/2023   Procedure: VIDEO BRONCHOSCOPY WITH ENDOBRONCHIAL ULTRASOUND;  Surgeon: Leslye Peer, MD;  Location: [facility];  Service: Pulmonary;  Laterality: N/A;    I have reviewed the social history and family history with the patient  and they are unchanged from previous note.  ALLERGIES:  is allergic to aspirin, gabapentin, losartan potassium-hctz, and penicillins.  MEDICATIONS:  Current Outpatient Medications  Medication Sig Dispense Refill   losartan (COZAAR) 100 MG tablet Take 100 mg by mouth daily.     Multiple Vitamin (MULTIVITAMIN) capsule Take 1 capsule by mouth daily.     pantoprazole (PROTONIX) 20 MG tablet Take 20 mg by mouth daily.     tamoxifen (NOLVADEX) 10 MG tablet Take 0.5 tablets (5 mg total) by mouth daily. 45 tablet 3   No current facility-administered medications for this visit.    PHYSICAL EXAMINATION: ECOG PERFORMANCE STATUS: 0 - Asymptomatic  Vitals:   10/10/23 1603  BP: (!) 174/64  Pulse: 64  Resp: 15  Temp: (!) 97.3 F (36.3 C)  SpO2: 100%   Wt Readings from Last 3 Encounters:  10/10/23 135 lb 8 oz (61.5 kg)  10/07/23 132 lb (59.9 kg)  08/02/23 137 lb (62.1 kg)     GENERAL:alert, no distress and comfortable SKIN: skin color, texture, turgor are normal,  no rashes or significant lesions EYES: normal, Conjunctiva are pink and non-injected, sclera clear Musculoskeletal:no cyanosis of digits and no clubbing  NEURO: alert & oriented x 3 with fluent speech, no focal motor/sensory deficits    LABORATORY DATA:  I have reviewed the data as listed    Latest Ref Rng & Units 10/07/2023    5:50 AM 07/11/2023   10:24 AM 10/04/2022   10:48 AM  CBC  WBC 4.0 - 10.5 K/uL 5.1  8.0  8.7   Hemoglobin 12.0 - 15.0 g/dL 16.1  09.6  04.5   Hematocrit 36.0 - 46.0 % 37.8  38.3  37.4   Platelets 150 - 400 K/uL 188  191  194         Latest Ref Rng & Units 10/07/2023    5:50 AM 07/11/2023   10:24 AM 10/04/2022   10:48 AM  CMP  Glucose 70 - 99 mg/dL 98  409  811   BUN 8 - 23 mg/dL 5  21  17    Creatinine 0.44 - 1.00 mg/dL 9.14  7.82  9.56   Sodium 135 - 145 mmol/L 128  139  139   Potassium 3.5 - 5.1 mmol/L 3.7  3.7  3.9   Chloride 98 - 111 mmol/L 97  102  103   CO2 22 - 32 mmol/L 22  32  31    Calcium 8.9 - 10.3 mg/dL 8.4  9.3  9.4   Total Protein 6.5 - 8.1 g/dL  6.7  6.5   Total Bilirubin 0.3 - 1.2 mg/dL  0.4  0.4   Alkaline Phos 38 - 126 U/L  36  34   AST 15 - 41 U/L  21  24   ALT 0 - 44 U/L  20  23       RADIOGRAPHIC STUDIES: I have personally reviewed the radiological images as listed and agreed with the findings in the report. No results found.    Orders Placed This Encounter  Procedures   MR Brain W Wo Contrast    Standing Status:   Future    Expected Date:   10/17/2023    Expiration Date:   10/09/2024    If indicated for the ordered procedure, I authorize the administration of contrast media per Radiology protocol:   Yes    What is the patient's sedation requirement?:   No Sedation    Does the patient have a pacemaker or implanted devices?:   No    Use SRS Protocol?:   No    Preferred imaging location?:   [facility] (table limit - 550 lbs)   CT CHEST ABDOMEN PELVIS W CONTRAST    Standing Status:   Future    Expected Date:   10/17/2023    Expiration Date:   10/09/2024    If indicated for the ordered procedure, I authorize the administration of contrast media per Radiology protocol:   Yes    Does the patient have a contrast media/X-ray dye allergy?:   No    Preferred imaging location?:   [facility]    If indicated for the ordered procedure, I authorize the administration of oral contrast media per Radiology protocol:   Yes   Ambulatory referral to Radiation Oncology    Referral Priority:   Urgent    Referral Type:   Consultation    Referral Reason:   Specialty Services Required    Requested Specialty:   Radiation Oncology    Number of Visits  Requested:   1   All questions were answered. The patient knows to call the clinic with any problems, questions or concerns. No barriers to learning was detected. The total time spent in the appointment was 40 minutes.     Malachy Mood, MD 10/10/2023

## 2023-10-11 ENCOUNTER — Telehealth: Payer: Self-pay | Admitting: Radiation Oncology

## 2023-10-11 ENCOUNTER — Telehealth: Payer: Self-pay | Admitting: Emergency Medicine

## 2023-10-11 NOTE — Telephone Encounter (Signed)
Patient is doing well post bronchoscopy, has good follow-up with oncology.  Please cancel the appointment with S. Groce on 10/15/2023 and with Dr. Delton Coombes on 11/13/2023.  Thank you

## 2023-10-11 NOTE — Telephone Encounter (Signed)
12/13 @ 10:12 am Left voicemail with assistance of [employer] for patient to call our office to be schedule for consult.

## 2023-10-11 NOTE — Progress Notes (Signed)
The proposed treatment discussed in conference is for discussion purpose only and is not a binding recommendation.  The patients have not been physically examined, or presented with their treatment options.  Therefore, final treatment plans cannot be decided.  

## 2023-10-12 ENCOUNTER — Ambulatory Visit (HOSPITAL_COMMUNITY)
Admission: RE | Admit: 2023-10-12 | Discharge: 2023-10-12 | Disposition: A | Discharge status: Home / Self Care | Payer: Medicare HMO | Source: Ambulatory Visit | Attending: Hematology | Admitting: Hematology

## 2023-10-12 DIAGNOSIS — C3490 Malignant neoplasm of unspecified part of unspecified bronchus or lung: Secondary | ICD-10-CM | POA: Diagnosis not present

## 2023-10-12 MED ORDER — GADOBUTROL 1 MMOL/ML IV SOLN
6.0000 mL | Freq: Once | INTRAVENOUS | Status: AC | PRN
Start: 2023-10-12 — End: 2023-10-12
  Administered 2023-10-12: 6 mL via INTRAVENOUS

## 2023-10-14 ENCOUNTER — Telehealth: Payer: Self-pay

## 2023-10-14 ENCOUNTER — Other Ambulatory Visit: Payer: Self-pay

## 2023-10-14 NOTE — Progress Notes (Signed)
Thoracic Location of Tumor / Histology: Right Middle Lobe Lung   CT CAP 10/17/2023   Biopsies of RML Lung 10/07/2023    Past/Anticipated interventions by cardiothoracic surgery, if any:   Past/Anticipated interventions by medical oncology, if any:  Dr. Arbutus Ped 10/18/2023 -   Tobacco/Marijuana/Snuff/ETOH use: Non Smoker  Signs/Symptoms Weight changes, if any: Fluctuates a couple of pounds. Respiratory complaints, if any: She reports increased work of breathing when climbing stairs. Hemoptysis, if any: She reports occasional productive cough with white phlegm. Pain issues, if any:  Denies chest pain, pressure, or tightness.  SAFETY ISSUES: Prior radiation? No Pacemaker/ICD? No  Possible current pregnancy? Postmenopausal Is the patient on methotrexate? No  Current Complaints / other details:

## 2023-10-14 NOTE — Progress Notes (Addendum)
I reached out to the pt via interpreter services to see if she could come to an appt to see Dr.Mohamed this Friday 12/20 at 9am. Call went to VM. Interpreter left VM At 09:53 pt's husband returned my call. Pts husband speaks english and does not require interpretation services. I asked if pt would be able to come to an appt with Dr Arbutus Ped this Friday 12/20 at 8:30 for labs and 9:00 for an appt with Dr Arbutus Ped.  Pts husband agrees to the appt. Pts husband also expresses concern about the CT she is supposed to get on 12/19 and the amount of radiation that all this imaging has exposed her to. I explained it's because her last CT was over 2 months ago and we needed and updated image. However, I let the pt's husband know that I would speak to Dr.Feng and Dr. Arbutus Ped and get back to him regarding his concern. Pt's husband verbalized appreciation.  Request for pt tissue from biopsy on 12/9 be sent to [facility] for molecular testing and PD-L1 emailed to Lita Mains, [facility] path tech.

## 2023-10-14 NOTE — Progress Notes (Signed)
Radiation Oncology         [phone] ________________________________  Name: Ashley Pratt        MRN: [id number]  Date of Service: 10/15/2023 DOB: 04-21-1951  [id number], Jerelyn Scott, MD  Malachy Mood, MD     REFERRING PHYSICIAN: Malachy Mood, MD   DIAGNOSIS: The encounter diagnosis was Malignant neoplasm of middle lobe of right lung [facility]).   HISTORY OF PRESENT ILLNESS: Ashley Pratt is a 72 y.o. female seen at the request of Dr. Mosetta Putt for a new diagnosis of Lung cancer.  The patient was found to have an irregular mass in the right lung found during the coronary calcium scoring CT and was referred to pulmonary medicine.  He initially scan measured this on 06/26/2023 as of 2.3 cm mass.  She underwent a PET scan on 08/08/2023 that showed a 2.2 cm posterior right middle lobe mass with an SUV of 7.8 there were also hypermetabolic lymph nodes including an AP window node measuring 7 mm with an SUV of 5.6, right paratracheal node measuring 8 mm with an SUV of 8.8, an 8 mm left hilar node with an SUV of 4.6, and a right hilar lymph node with an SUV of 4.9 no measurements given.  She then underwent a bronchoscopy on 10/07/2023.  Cytology showed adenocarcinoma in the fine-needle aspirate and brushings from the right middle lobe.  Assessment also of the forearm lymph node showed malignant cells and the level 7 node showed atypical cells.  She was seen by Dr. Mosetta Putt given a history of LCIS of the breast and has been referred to Dr. Arbutus Ped to establish care.  MRI brain on 10/12/2023 has also been performed and was negative for metastatic disease. She's seen to consider chemoradiation.     PREVIOUS RADIATION THERAPY: No   PAST MEDICAL HISTORY:  Past Medical History:  Diagnosis Date   Aortic stenosis    Arthritis    Breast cancer (HCC)    Chronic back pain    Colon polyps    GERD (gastroesophageal reflux disease)    Hypertension    Malignant neoplasm of right female breast (HCC)     unspecified site of breast   Osteoarthritis    of the knee left worse than right   Prediabetes        PAST SURGICAL HISTORY: Past Surgical History:  Procedure Laterality Date   BREAST EXCISIONAL BIOPSY Right 2014   BREAST LUMPECTOMY Right 05/11/2013   high risk lumpectomy   BREAST LUMPECTOMY Right 2021   LCIS   BREAST LUMPECTOMY WITH NEEDLE LOCALIZATION Right 05/11/2013   Procedure: RIGHT BREAST NEEDLE LOCALIZATION  LUMPECTOMY;  Surgeon: Almond Lint, MD;  Location: [facility];  Service: General;  Laterality: Right;   BREAST LUMPECTOMY WITH RADIOACTIVE SEED LOCALIZATION Right 08/02/2020   Procedure: RIGHT BREAST LUMPECTOMY WITH RADIOACTIVE SEED LOCALIZATION;  Surgeon: Almond Lint, MD;  Location: [facility];  Service: General;  Laterality: Right;  RNFA   BRONCHIAL BIOPSY  10/07/2023   Procedure: BRONCHIAL BIOPSIES;  Surgeon: Leslye Peer, MD;  Location: [facility];  Service: Pulmonary;;   BRONCHIAL BRUSHINGS  10/07/2023   Procedure: BRONCHIAL BRUSHINGS;  Surgeon: Leslye Peer, MD;  Location: [facility];  Service: Pulmonary;;   BRONCHIAL NEEDLE ASPIRATION BIOPSY  10/07/2023   Procedure: BRONCHIAL NEEDLE ASPIRATION BIOPSIES;  Surgeon: Leslye Peer, MD;  Location: [facility];  Service: Pulmonary;;   CHOLECYSTECTOMY     COLONOSCOPY     FOOT OSTEOTOMY  both  feet   TONSILLECTOMY     VIDEO BRONCHOSCOPY WITH ENDOBRONCHIAL ULTRASOUND N/A 10/07/2023   Procedure: VIDEO BRONCHOSCOPY WITH ENDOBRONCHIAL ULTRASOUND;  Surgeon: Leslye Peer, MD;  Location: [facility];  Service: Pulmonary;  Laterality: N/A;     FAMILY HISTORY:  Family History  Problem Relation Age of Onset   Hypertension Mother    Stroke Mother    Hypertension Sister    Hypertension Brother      SOCIAL HISTORY:  reports that she has never smoked. She has never used smokeless tobacco. She reports that she does not drink alcohol and does not use drugs. The patient is  married. She lives in [place] and is originally from [place]. She is accompanied by her husband. They have two adult children, a son who lives with them, and a daughter who [place]. She is very active and enjoys exercising at home or at the [place] daily.    ALLERGIES: Aspirin, Gabapentin, Losartan potassium-hctz, and Penicillins   MEDICATIONS:  Current Outpatient Medications  Medication Sig Dispense Refill   losartan (COZAAR) 100 MG tablet Take 100 mg by mouth daily.     magnesium gluconate (MAGONATE) 500 MG tablet Take 500 mg by mouth 2 (two) times daily.     Multiple Vitamin (MULTIVITAMIN) capsule Take 1 capsule by mouth daily.     pantoprazole (PROTONIX) 20 MG tablet Take 20 mg by mouth daily.     No current facility-administered medications for this encounter.     REVIEW OF SYSTEMS: On review of systems, the patient reports that she has weight fluctuation by a few pounds, but no significant unintended weight loss. She has increased efforts for breathing with climbing the stairs but denies any shortness of breath at rest or with her usual exercise. She is not having any hemoptysis, and has an occasional productive cough with white phlegm.      PHYSICAL EXAM:  Wt Readings from Last 3 Encounters:  10/15/23 131 lb 9.6 oz (59.7 kg)  10/10/23 135 lb 8 oz (61.5 kg)  10/07/23 132 lb (59.9 kg)   Temp Readings from Last 3 Encounters:  10/15/23 98.1 F (36.7 C) (Oral)  10/10/23 (!) 97.3 F (36.3 C) (Temporal)  10/07/23 98.7 F (37.1 C)   BP Readings from Last 3 Encounters:  10/15/23 (!) 170/77  10/10/23 (!) 174/64  10/07/23 (!) 168/80   Pulse Readings from Last 3 Encounters:  10/15/23 73  10/10/23 64  10/07/23 71   Pain Assessment Pain Score: 0-No pain/10  In general this is a well appearing [place] female in no acute distress. She's alert and oriented x4 and appropriate throughout the examination. Cardiopulmonary assessment is negative for acute  distress and she exhibits normal effort.     ECOG = 1  0 - Asymptomatic (Fully active, able to carry on all predisease activities without restriction)  1 - Symptomatic but completely ambulatory (Restricted in physically strenuous activity but ambulatory and able to carry out work of a light or sedentary nature. For example, light housework, office work)  2 - Symptomatic, <50% in bed during the day (Ambulatory and capable of all self care but unable to carry out any work activities. Up and about more than 50% of waking hours)  3 - Symptomatic, >50% in bed, but not bedbound (Capable of only limited self-care, confined to bed or chair 50% or more of waking hours)  4 - Bedbound (Completely disabled. Cannot carry on any self-care. Totally confined to bed or  chair)  5 - Death   Santiago Glad MM, Creech RH, Tormey DC, et al. [phone]). "Toxicity and response criteria of the [facility]". Am. Evlyn Clines. Oncol. 5 (6): 649-55    LABORATORY DATA:  Lab Results  Component Value Date   WBC 5.1 10/07/2023   HGB 12.4 10/07/2023   HCT 37.8 10/07/2023   MCV 77.5 (L) 10/07/2023   PLT 188 10/07/2023   Lab Results  Component Value Date   NA 128 (L) 10/07/2023   K 3.7 10/07/2023   CL 97 (L) 10/07/2023   CO2 22 10/07/2023   Lab Results  Component Value Date   ALT 20 07/11/2023   AST 21 07/11/2023   ALKPHOS 36 (L) 07/11/2023   BILITOT 0.4 07/11/2023      RADIOGRAPHY: MR Brain W Wo Contrast Result Date: 10/12/2023 CLINICAL DATA:  Non-small cell lung cancer (NSCLC), staging EXAM: MRI HEAD WITHOUT AND WITH CONTRAST TECHNIQUE: Multiplanar, multiecho pulse sequences of the brain and surrounding structures were obtained without and with intravenous contrast. CONTRAST:  6mL GADAVIST GADOBUTROL 1 MMOL/ML IV SOLN COMPARISON:  MRI August 03, 2021 FINDINGS: Brain: No acute infarction, hemorrhage, hydrocephalus, extra-axial collection or mass lesion. No pathologic enhancement. Vascular: Major  arterial flow voids are maintained at the skull base. Skull and upper cervical spine: Normal marrow signal. Sinuses/Orbits: Negative. IMPRESSION: No evidence of acute intracranial abnormality or metastatic disease. Electronically Signed   By: Feliberto Harts M.D.   On: 10/12/2023 16:53   DG Chest Port 1 View Result Date: 10/07/2023 CLINICAL DATA:  Status post bronchoscopy with biopsy. EXAM: PORTABLE CHEST 1 VIEW COMPARISON:  Mar 26, 2019.  August 08, 2023. FINDINGS: Stable cardiomediastinal silhouette. No definite pneumothorax or pleural effusion is noted. Stable nodular density seen in right midlung consistent with probable malignancy noted on recent PET scan. Left lung is unremarkable. IMPRESSION: No definite pneumothorax status post bronchoscopy and biopsy. Electronically Signed   By: Lupita Raider M.D.   On: 10/07/2023 09:33   DG C-ARM BRONCHOSCOPY Result Date: 10/07/2023 C-ARM BRONCHOSCOPY: Fluoroscopy was utilized by the requesting physician.  No radiographic interpretation.       IMPRESSION/PLAN: 1. Stage IIIA, cT1cN2M0, NSCLC, adenocarcinoma of the RML. Dr. Mitzi Hansen discusses the pathology findings and reviews the nature of locally advanced lung cancer.  Dr. Mosetta Putt recommends repeating CT imaging this week as well for more up to date baseline for future comparison. We discussed the risks, benefits, short, and long term effects of radiotherapy, as well as the curative intent, and the patient is interested in proceeding. Dr. Mitzi Hansen discusses the delivery and logistics of radiotherapy and anticipates a course of 5 1/2 weeks of radiotherapy to the RML target and regional nodes. Written consent is obtained and placed in the chart, a copy was provided to the patient. She will simulate on Thursday 10/17/23, and we anticipate starting treatment on 10/28/23.   In a visit lasting 60 minutes, greater than 50% of the time was spent face to face discussing the patient's condition, in preparation for the  discussion, and coordinating the patient's care.   The above documentation reflects my direct findings during this shared patient visit. Please see the separate note by Dr. Mitzi Hansen on this date for the remainder of the patient's plan of care.    Osker Mason, [facility]   **Disclaimer: This note was dictated with voice recognition software. Similar sounding words can inadvertently be transcribed and this note may contain transcription errors which may not have been corrected  upon publication of note.**

## 2023-10-14 NOTE — Telephone Encounter (Signed)
Pt's husband called inquiring about pt's future appts and when the pt would start chemotherapy.  Pt has completed the MRI Dr. Mosetta Putt ordered on 10/12/2023.  Pt's husband stated that the pt is scheduled on 10/17/2023 for the CT CAPS w/contrast Dr. Mosetta Putt ordered.  Pt's husband wanted to know when the pt would be scheduled to see Dr. Shirline Frees and start radiation.  Stated Dr. Mosetta Putt made the referral to radiation oncology but they maybe waiting for the results of the CT CAP w/contrast before moving forward.  Stated this nurse will notify Dr. Mosetta Putt to see what's the status of the referrals.  Pt's husband verbalized understanding and had no further questions or concerns at this time.

## 2023-10-15 ENCOUNTER — Ambulatory Visit
Admission: RE | Admit: 2023-10-15 | Discharge: 2023-10-15 | Disposition: A | Discharge status: Home / Self Care | Payer: Medicare HMO | Source: Ambulatory Visit | Attending: Radiation Oncology | Admitting: Radiation Oncology

## 2023-10-15 ENCOUNTER — Encounter: Payer: Self-pay | Admitting: Radiation Oncology

## 2023-10-15 ENCOUNTER — Ambulatory Visit: Payer: Medicare HMO | Admitting: Acute Care

## 2023-10-15 VITALS — BP 170/77 | HR 73 | Temp 98.1°F | Resp 18 | Ht 60.0 in | Wt 131.6 lb

## 2023-10-15 DIAGNOSIS — Z79899 Other long term (current) drug therapy: Secondary | ICD-10-CM | POA: Diagnosis not present

## 2023-10-15 DIAGNOSIS — I10 Essential (primary) hypertension: Secondary | ICD-10-CM | POA: Insufficient documentation

## 2023-10-15 DIAGNOSIS — Z860100 Personal history of colon polyps, unspecified: Secondary | ICD-10-CM | POA: Diagnosis not present

## 2023-10-15 DIAGNOSIS — C342 Malignant neoplasm of middle lobe, bronchus or lung: Secondary | ICD-10-CM | POA: Insufficient documentation

## 2023-10-15 DIAGNOSIS — M1990 Unspecified osteoarthritis, unspecified site: Secondary | ICD-10-CM | POA: Diagnosis not present

## 2023-10-15 DIAGNOSIS — I350 Nonrheumatic aortic (valve) stenosis: Secondary | ICD-10-CM | POA: Insufficient documentation

## 2023-10-15 DIAGNOSIS — K219 Gastro-esophageal reflux disease without esophagitis: Secondary | ICD-10-CM | POA: Insufficient documentation

## 2023-10-15 NOTE — Progress Notes (Signed)
I reached out to pt's husband to verify pt's demographic information. Also reviewed the pt's appts with the husband. The pt does not have myChart, so I let the pt's husband know that the appt with Dr Arbutus Ped on 12/20 had to be moved to 9:45 for labs and 10:15 for an appt with Dr Arbutus Ped. Husband verbalized understanding.

## 2023-10-16 DIAGNOSIS — C3490 Malignant neoplasm of unspecified part of unspecified bronchus or lung: Secondary | ICD-10-CM | POA: Diagnosis not present

## 2023-10-17 ENCOUNTER — Ambulatory Visit
Admission: RE | Admit: 2023-10-17 | Discharge: 2023-10-17 | Disposition: A | Discharge status: Home / Self Care | Payer: Medicare HMO | Source: Ambulatory Visit | Attending: Radiation Oncology | Admitting: Radiation Oncology

## 2023-10-17 ENCOUNTER — Ambulatory Visit (HOSPITAL_COMMUNITY)
Admission: RE | Admit: 2023-10-17 | Discharge: 2023-10-17 | Disposition: A | Discharge status: Home / Self Care | Payer: Medicare HMO | Source: Ambulatory Visit | Attending: Hematology | Admitting: Hematology

## 2023-10-17 ENCOUNTER — Other Ambulatory Visit: Payer: Self-pay

## 2023-10-17 DIAGNOSIS — I700 Atherosclerosis of aorta: Secondary | ICD-10-CM | POA: Diagnosis not present

## 2023-10-17 DIAGNOSIS — N200 Calculus of kidney: Secondary | ICD-10-CM | POA: Diagnosis not present

## 2023-10-17 DIAGNOSIS — C3490 Malignant neoplasm of unspecified part of unspecified bronchus or lung: Secondary | ICD-10-CM

## 2023-10-17 DIAGNOSIS — C342 Malignant neoplasm of middle lobe, bronchus or lung: Secondary | ICD-10-CM | POA: Insufficient documentation

## 2023-10-17 MED ORDER — IOHEXOL 300 MG/ML  SOLN
100.0000 mL | Freq: Once | INTRAMUSCULAR | Status: AC | PRN
  Administered 2023-10-17: 100 mL via INTRAVENOUS

## 2023-10-18 ENCOUNTER — Inpatient Hospital Stay: Payer: Medicare HMO | Admitting: Internal Medicine

## 2023-10-18 ENCOUNTER — Inpatient Hospital Stay: Payer: Medicare HMO

## 2023-10-18 VITALS — BP 142/59 | HR 80 | Temp 98.0°F | Resp 16 | Ht 60.0 in | Wt 130.3 lb

## 2023-10-18 DIAGNOSIS — Z170 Estrogen receptor positive status [ER+]: Secondary | ICD-10-CM | POA: Diagnosis not present

## 2023-10-18 DIAGNOSIS — Z5111 Encounter for antineoplastic chemotherapy: Secondary | ICD-10-CM | POA: Diagnosis not present

## 2023-10-18 DIAGNOSIS — D0501 Lobular carcinoma in situ of right breast: Secondary | ICD-10-CM

## 2023-10-18 DIAGNOSIS — C3490 Malignant neoplasm of unspecified part of unspecified bronchus or lung: Secondary | ICD-10-CM

## 2023-10-18 DIAGNOSIS — C342 Malignant neoplasm of middle lobe, bronchus or lung: Secondary | ICD-10-CM

## 2023-10-18 LAB — CMP (CANCER CENTER ONLY)
ALT: 22 U/L (ref 0–44)
AST: 24 U/L (ref 15–41)
Albumin: 4.1 g/dL (ref 3.5–5.0)
Alkaline Phosphatase: 37 U/L — ABNORMAL LOW (ref 38–126)
Anion gap: 5 (ref 5–15)
BUN: 11 mg/dL (ref 8–23)
CO2: 30 mmol/L (ref 22–32)
Calcium: 9.2 mg/dL (ref 8.9–10.3)
Chloride: 98 mmol/L (ref 98–111)
Creatinine: 0.79 mg/dL (ref 0.44–1.00)
GFR, Estimated: >60 mL/min (ref >60)
Glucose, Bld: 145 mg/dL — ABNORMAL HIGH (ref 70–99)
Potassium: 4.4 mmol/L (ref 3.5–5.1)
Sodium: 133 mmol/L — ABNORMAL LOW (ref 135–145)
Total Bilirubin: 0.5 mg/dL (ref <1.2)
Total Protein: 6.3 g/dL — ABNORMAL LOW (ref 6.5–8.1)

## 2023-10-18 LAB — CBC WITH DIFFERENTIAL (CANCER CENTER ONLY)
Abs Immature Granulocytes: 0.01 10*3/uL (ref 0.00–0.07)
Basophils Absolute: 0 10*3/uL (ref 0.0–0.1)
Basophils Relative: 0 %
Eosinophils Absolute: 0 10*3/uL (ref 0.0–0.5)
Eosinophils Relative: 1 %
HCT: 36.2 % (ref 36.0–46.0)
Hemoglobin: 12 g/dL (ref 12.0–15.0)
Immature Granulocytes: 0 %
Lymphocytes Relative: 28 %
Lymphs Abs: 2.2 10*3/uL (ref 0.7–4.0)
MCH: 25.5 pg — ABNORMAL LOW (ref 26.0–34.0)
MCHC: 33.1 g/dL (ref 30.0–36.0)
MCV: 76.9 fL — ABNORMAL LOW (ref 80.0–100.0)
Monocytes Absolute: 0.5 10*3/uL (ref 0.1–1.0)
Monocytes Relative: 7 %
Neutro Abs: 5.1 10*3/uL (ref 1.7–7.7)
Neutrophils Relative %: 64 %
Platelet Count: 209 10*3/uL (ref 150–400)
RBC: 4.71 MIL/uL (ref 3.87–5.11)
RDW: 14.8 % (ref 11.5–15.5)
WBC Count: 7.8 10*3/uL (ref 4.0–10.5)
nRBC: 0 % (ref 0.0–0.2)

## 2023-10-18 MED ORDER — PROCHLORPERAZINE MALEATE 10 MG PO TABS: 10.0000 mg | ORAL_TABLET | Freq: Four times a day (QID) | ORAL | 1 refills | Status: DC | PRN

## 2023-10-18 MED ORDER — ONDANSETRON HCL 8 MG PO TABS: 8.0000 mg | ORAL_TABLET | Freq: Three times a day (TID) | ORAL | 1 refills | Status: DC | PRN

## 2023-10-18 NOTE — Progress Notes (Signed)
[facility] [phone]   [phone]  CONSULT NOTE  REFERRING PHYSICIAN: Dr. Georgann Housekeeper  REASON FOR CONSULTATION:  72 years old female recently diagnosed with lung cancer  HPI Ashley Pratt is a 72 y.o. female presented to the clinic today for initial evaluation of lung cancer accompanied by her husband and daughter. Discussed the use of AI scribe software for clinical note transcription with the patient, who gave verbal consent to proceed.  History of Present Illness   The patient, a 72 year old individual with a recent diagnosis of lung adenocarcinoma, presents for an initial consultation. The lung cancer was incidentally discovered during a cardiac CT scan, prompted by the patient's son's concern about potential heart disease risk. The patient has a history of breast cancer, treated twice in 2014 and 2021 with hormonal therapy, specifically tamoxifen, for ductal carcinoma in situ. The patient stopped tamoxifen therapy last month, as advised by her previous oncologist, Dr. Mosetta Putt, after a negative follow-up.   The patient had a PET scan on August 08, 2023 and that showed 2.2 x 1.6 cm posterior right middle lobe nodule abutting the major fissure with maximum SUV of 7.8 suggesting primary bronchogenic carcinoma.  There was also hypermetabolic thoracic adenopathy including 0.7 cm AP window lymph node with SUV max of 5.6 and 0.8 cm short axis right paratracheal lymph node with SUV max of 8.8 and 0.8 cm short axis left hilar lymph node with SUV max of 4.6 as well as hypermetabolism in the right hilar region with maximum SUV of 4.9.  The patient reports recent unintentional weight loss of approximately 3-4 pounds and increased fatigue. She also describes shortness of breath when climbing stairs, occasional right-sided chest pain, and a slightly increased heart rate. However, she denies any cough, hemoptysis, nausea, vomiting, diarrhea, headaches, or  changes in vision.  The patient's medical history includes arthritis, hypertension, acid reflux, and prediabetes. She denies any history of heart attacks, strokes, or high cholesterol. The patient's family history is significant for a brother who had a brain tumor and two other brothers who had strokes. The patient's parents passed away due to pneumonia and stroke, respectively. The patient denies any personal history of smoking, alcohol use, or illicit drug use. She reports an allergy to aspirin, which causes stomach upset.  The patient underwent a bronchoscopy with biopsy on October 07, 2023, which confirmed the diagnosis of adenocarcinoma. An MRI of the brain was performed on October 12, 2023, which was negative for metastasis. A CT scan of the chest, abdomen, and pelvis was performed recently to assess for any changes over the past two months, but the results are not yet available. The patient's tissue has been sent for molecular marker and PD-L1 testing, the results of which are also pending.      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  BREAST LUMPECTOMY WITH NEEDLE LOCALIZATION Right 05/11/2013   Procedure: RIGHT BREAST NEEDLE LOCALIZATION  LUMPECTOMY;  Surgeon: Almond Lint, MD;  Location: [facility];  Service: General;  Laterality: Right;   BREAST LUMPECTOMY WITH RADIOACTIVE SEED LOCALIZATION Right 08/02/2020   Procedure: RIGHT BREAST LUMPECTOMY WITH RADIOACTIVE SEED  LOCALIZATION;  Surgeon: Almond Lint, MD;  Location: [facility];  Service: General;  Laterality: Right;  RNFA   BRONCHIAL BIOPSY  10/07/2023   Procedure: BRONCHIAL BIOPSIES;  Surgeon: Leslye Peer, MD;  Location: [facility];  Service: Pulmonary;;   BRONCHIAL BRUSHINGS  10/07/2023   Procedure: BRONCHIAL BRUSHINGS;  Surgeon: Leslye Peer, MD;  Location: [facility] END[facility]COPY;  Service: Pulmonary;;   BRONCHIAL NEEDLE ASPIRATION BIOPSY  10/07/2023   Procedure: BRONCHIAL NEEDLE ASPIRATION BIOPSIES;  Surgeon: Leslye Peer, MD;  Location: [facility] END[facility]COPY;  Service: Pulmonary;;   CHOLECYSTECTOMY     COLONOSCOPY     FOOT OSTEOTOMY     both  feet   TONSILLECTOMY     VIDEO BRONCHOSCOPY WITH ENDOBRONCHIAL ULTRASOUND N/A 10/07/2023   Procedure: VIDEO BRONCHOSCOPY WITH ENDOBRONCHIAL ULTRASOUND;  Surgeon: Leslye Peer, MD;  Location: [facility];  Service: Pulmonary;  Laterality: N/A;    Family History  Problem Relation Age of Onset   Hypertension Mother    Stroke Mother    Hypertension Sister    Hypertension Brother     Social History Social History   Tobacco Use   Smoking status: Never   Smokeless tobacco: Never  Substance Use Topics   Alcohol use: No   Drug use: No    Allergies  Allergen Reactions   Aspirin     Gi upset   Gabapentin     Other Reaction(s): intolerant   Losartan Potassium-Hctz     Other Reaction(s): HCTZ - could not tolerate- losartan ok   Penicillins     Not sure    Current Outpatient Medications  Medication Sig Dispense Refill   losartan (COZAAR) 100 MG tablet Take 100 mg by mouth daily.     magnesium gluconate (MAGONATE) 500 MG tablet Take 500 mg by mouth 2 (two) times daily.     Multiple Vitamin (MULTIVITAMIN) capsule Take 1 capsule by mouth daily.     pantoprazole (PROTONIX) 20 MG tablet Take 20 mg by mouth daily.     No current facility-administered medications for this visit.    Review of Systems  Constitutional: positive for fatigue  and weight loss Eyes: negative Ears, nose, mouth, throat, and face: negative Respiratory: positive for cough Cardiovascular: negative Gastrointestinal: negative Genitourinary:negative Integument/breast: negative Hematologic/lymphatic: negative Musculoskeletal:negative Neurological: negative Behavioral/Psych: negative Endocrine: negative Allergic/Immunologic: negative  Physical Exam  WJX:BJYNW, healthy, no distress, well nourished, and well developed SKIN: skin color, texture, turgor are normal, no rashes or significant lesions HEAD: Normocephalic, No masses, lesions, tenderness or abnormalities EYES: normal, PERRLA, Conjunctiva are pink and non-injected EARS: External ears normal, Canals clear OROPHARYNX:no exudate, no erythema, and lips, buccal mucosa, and tongue normal  NECK: supple, no adenopathy, no JVD LYMPH:  no palpable lymphadenopathy, no hepatosplenomegaly BREAST:not examined LUNGS: clear to auscultation , and palpation HEART: regular rate & rhythm, no murmurs, and no gallops ABDOMEN:abdomen soft, non-tender, normal bowel sounds, and no masses or organomegaly BACK: Back symmetric, no curvature., No CVA tenderness EXTREMITIES:no joint deformities, effusion, or inflammation, no edema  NEURO: alert & oriented x 3 with fluent speech, no focal motor/sensory deficits  PERFORMANCE STATUS: ECOG 1  LABORATORY DATA: Lab Results  Component Value  Date   WBC 7.8 10/18/2023   HGB 12.0 10/18/2023   HCT 36.2 10/18/2023   MCV 76.9 (L) 10/18/2023   PLT 209 10/18/2023      Chemistry      Component Value Date/Time   NA 128 (L) 10/07/2023 0550   NA 139 10/17/2015 1322   K 3.7 10/07/2023 0550   K 4.1 10/17/2015 1322   CL 97 (L) 10/07/2023 0550   CO2 22 10/07/2023 0550   CO2 28 10/17/2015 1322   BUN <5 (L) 10/07/2023 0550   BUN 14.5 10/17/2015 1322   CREATININE 0.75 10/07/2023 0550   CREATININE 0.83 07/11/2023 1024   CREATININE 0.87 04/10/2016 1540   CREATININE 0.9  10/17/2015 1322      Component Value Date/Time   CALCIUM 8.4 (L) 10/07/2023 0550   CALCIUM 10.1 10/17/2015 1322   ALKPHOS 36 (L) 07/11/2023 1024   ALKPHOS 103 10/17/2015 1322   AST 21 07/11/2023 1024   AST 24 10/17/2015 1322   ALT 20 07/11/2023 1024   ALT 32 10/17/2015 1322   BILITOT 0.4 07/11/2023 1024   BILITOT 0.33 10/17/2015 1322       RADIOGRAPHIC STUDIES: CT Super D Chest Wo Contrast Result Date: 10/15/2023 CLINICAL DATA:  Follow-up FDG avid right middle lobe nodule, additional history of breast cancer * Tracking Code: BO * EXAM: CT CHEST WITHOUT CONTRAST TECHNIQUE: Multidetector CT imaging of the chest was performed using thin slice collimation for electromagnetic bronchoscopy planning purposes, without intravenous contrast. RADIATION DOSE REDUCTION: This exam was performed according to the departmental dose-optimization program which includes automated exposure control, adjustment of the mA and/or kV according to patient size and/or use of iterative reconstruction technique. COMPARISON:  PET-CT, 08/08/2023 FINDINGS: Cardiovascular: Aortic atherosclerosis. Cardiomegaly. No pericardial effusion. Mediastinum/Nodes: Unchanged enlarged mediastinal and hilar lymph nodes, pretracheal nodes measuring up to 1.4 x 1.0 cm (series 3, image 67). Incidental note of vascular variant persistent left-sided SVC (series 3, image 42). Thyroid gland, trachea, and esophagus demonstrate no significant findings. Lungs/Pleura: Stable, or perhaps minimally enlarged mass abutting the fissure in the lateral segment right middle lobe measuring 2.3 x 2.0 cm, previously 2.2 x 1.8 cm when measured with similar technique (series 4, image 75). Unchanged, scattered bronchiolar plugging and centrilobular and tree-in-bud nodularity in the inferior right middle lobe (series 4, image 99). No pleural effusion or pneumothorax. Upper Abdomen: No acute abnormality. Musculoskeletal: Status post right lumpectomy. No acute osseous  findings. IMPRESSION: 1. Stable, or perhaps minimally enlarged mass abutting the fissure in the lateral segment right middle lobe measuring 2.3 x 2.0 cm, previously 2.2 x 1.8 cm when measured with similar technique. This remains presumed primary lung malignancy. 2. Unchanged enlarged metastatic mediastinal and hilar lymph nodes. 3. Unchanged, scattered bronchiolar plugging and centrilobular and tree-in-bud nodularity in the inferior right middle lobe, consistent with atypical infection, particularly atypical mycobacterium. 4. Cardiomegaly. Aortic Atherosclerosis (ICD10-I70.0). Electronically Signed   By: Jearld Lesch M.D.   On: 10/15/2023 14:13   MR Brain W Wo Contrast Result Date: 10/12/2023 CLINICAL DATA:  Non-small cell lung cancer (NSCLC), staging EXAM: MRI HEAD WITHOUT AND WITH CONTRAST TECHNIQUE: Multiplanar, multiecho pulse sequences of the brain and surrounding structures were obtained without and with intravenous contrast. CONTRAST:  6mL GADAVIST GADOBUTROL 1 MMOL/ML IV SOLN COMPARISON:  MRI August 03, 2021 FINDINGS: Brain: No acute infarction, hemorrhage, hydrocephalus, extra-axial collection or mass lesion. No pathologic enhancement. Vascular: Major arterial flow voids are maintained at the skull base. Skull and upper cervical spine: Normal  marrow signal. Sinuses/Orbits: Negative. IMPRESSION: No evidence of acute intracranial abnormality or metastatic disease. Electronically Signed   By: Feliberto Harts M.D.   On: 10/12/2023 16:53   DG Chest Port 1 View Result Date: 10/07/2023 CLINICAL DATA:  Status post bronchoscopy with biopsy. EXAM: PORTABLE CHEST 1 VIEW COMPARISON:  Mar 26, 2019.  August 08, 2023. FINDINGS: Stable cardiomediastinal silhouette. No definite pneumothorax or pleural effusion is noted. Stable nodular density seen in right midlung consistent with probable malignancy noted on recent PET scan. Left lung is unremarkable. IMPRESSION: No definite pneumothorax status post bronchoscopy  and biopsy. Electronically Signed   By: Lupita Raider M.D.   On: 10/07/2023 09:33   DG C-ARM BRONCHOSCOPY Result Date: 10/07/2023 C-ARM BRONCHOSCOPY: Fluoroscopy was utilized by the requesting physician.  No radiographic interpretation.    ASSESSMENT: This is a very pleasant 72 years old female recently diagnosed with a stage IIIb (T1c, N3, M0) non-small cell lung cancer, adenocarcinoma presented with right middle lobe lung nodule in addition to bilateral hilar and mediastinal lymphadenopathy diagnosed in December 2024.  Molecular studies and PD-L1 expression are still pending.   PLAN: I had a lengthy discussion with the patient and her family today about her current disease stage, prognosis and treatment options.  I personally and independently reviewed her previous imaging studies and pathology report. Assessment and Plan    STAGE IIIB (T1c, N3, M0) Non-Small Cell Lung Cancer (NSCLC) - Adenocarcinoma diagnosed in December 2024. Diagnosed with NSCLC adenocarcinoma, confirmed by bronchoscopy and biopsy on October 07, 2023. Suspected stage III due to involvement of the right middle lobe, bilateral hilar and mediastinal lymph nodes. Molecular marker and PD-L1 testing pending. History of breast cancer treated with hormonal therapy, stopped last month. Reports weight loss, fatigue, and occasional right-sided chest pain. MRI of the brain on October 12, 2023, was negative. Awaiting CT scan results of the chest, abdomen, and pelvis. Discussed concurrent chemoradiation therapy with weekly carboplatin and paclitaxel to enhance radiation effectiveness. Explained potential side effects including nausea, fatigue, and blood count suppression. Discussed possible immunotherapy if molecular markers are negative. Emphasized weekly monitoring of blood counts, kidney, and liver function. - Start concurrent chemoradiation therapy on October 28, 2023 - Administer weekly carboplatin for AUC 2 and paclitaxel 45 mg/m2  with radiation. - Schedule radiation therapy Monday through Friday - Monitor blood counts, kidney, and liver function weekly - Arrange port placement if molecular markers are negative and immunotherapy is required - Provide anti-nausea medication - Schedule chemotherapy education class - Refer to dietitian for nutritional guidance  Hypertension Hypertension, no current medication details provided. - Monitor blood pressure regularly  Prediabetes Prediabetes, no current symptoms or treatment details provided. - Monitor blood glucose levels regularly  Arthritis Arthritis, no current symptoms or treatment details provided. - Monitor symptoms and manage as needed  Gastroesophageal Reflux Disease (GERD) GERD, no current symptoms or treatment details provided. - Monitor symptoms and manage as needed  General Health Maintenance Discussed the importance of a healthy diet and avoiding unnecessary supplements. No smoking or alcohol use. Family history of stroke and brain tumor. - Encourage a balanced diet with adequate protein - Refer to dietitian for detailed dietary plan  Follow-up - Follow-up every other week during treatment - Arrange follow-up CT scan after six and a half weeks of treatment to assess response.    The patient voices understanding of current disease status and treatment options and is in agreement with the current care plan.  All questions were answered.  The patient knows to call the clinic with any problems, questions or concerns. We can certainly see the patient much sooner if necessary.  Thank you so much for allowing me to participate in the care of [facility]. I will continue to follow up the patient with you and assist in her care. The total time spent in the appointment was 90 minutes.  Disclaimer: This note was dictated with voice recognition software. Similar sounding words can inadvertently be transcribed and may not be corrected upon  review.   Lajuana Matte October 18, 2023, 10:26 AM

## 2023-10-18 NOTE — Progress Notes (Signed)
START OFF PATHWAY REGIMEN - Non-Small Cell Lung   [id number]:Carboplatin AUC=2 IV D1 + Paclitaxel 45 mg/m2 IV D1 q7 Days + RT:   A cycle is every 7 days, concurrent with RT:     Paclitaxel      Carboplatin   **Always confirm dose/schedule in your pharmacy ordering system**  Patient Characteristics: Preoperative or Nonsurgical Candidate (Clinical Staging), Stage III - Nonsurgical Candidate (Nonsquamous and Squamous), PS = 0,1, EGFR Mutation - Common (Exon 19 Deletion or Exon 21 L858R Substitution) Status Unknown Therapeutic Status: Preoperative or Nonsurgical Candidate (Clinical Staging) AJCC T Category: cT1c AJCC N Category: cN3 AJCC M Category: cM0 AJCC 8 Stage Grouping: IIIB ECOG Performance Status: 1 EGFR Mutation - Common (Exon 19 Deletion or Exon 21 L858R Substitution): Awaiting Test Results Intent of Therapy: Curative Intent, Discussed with Patient

## 2023-10-20 ENCOUNTER — Other Ambulatory Visit: Payer: Self-pay

## 2023-10-21 ENCOUNTER — Encounter (HOSPITAL_COMMUNITY): Payer: Self-pay

## 2023-10-21 DIAGNOSIS — C342 Malignant neoplasm of middle lobe, bronchus or lung: Secondary | ICD-10-CM | POA: Diagnosis not present

## 2023-10-21 NOTE — Progress Notes (Signed)

## 2023-10-22 ENCOUNTER — Other Ambulatory Visit: Payer: Self-pay | Admitting: Hematology

## 2023-10-22 ENCOUNTER — Encounter (HOSPITAL_COMMUNITY): Payer: Self-pay | Admitting: Internal Medicine

## 2023-10-22 DIAGNOSIS — R69 Illness, unspecified: Secondary | ICD-10-CM | POA: Diagnosis not present

## 2023-10-22 NOTE — Progress Notes (Signed)
Appears to be stable CT CAP. Appointment to discuss 10/29/2023.

## 2023-10-24 ENCOUNTER — Telehealth: Payer: Self-pay

## 2023-10-24 ENCOUNTER — Telehealth: Payer: Self-pay | Admitting: Medical Oncology

## 2023-10-24 ENCOUNTER — Other Ambulatory Visit: Payer: Self-pay | Admitting: Medical Oncology

## 2023-10-24 DIAGNOSIS — C3491 Malignant neoplasm of unspecified part of right bronchus or lung: Secondary | ICD-10-CM

## 2023-10-24 NOTE — Telephone Encounter (Signed)
Husband requested a dietician consult tomorrow or another day. Referral sent to [facility]. Dietician notified.

## 2023-10-24 NOTE — Progress Notes (Signed)
Nutrition consult placed per pt request

## 2023-10-24 NOTE — Telephone Encounter (Signed)
Spoke with patients spouse about dietician appt.  Ashley Pratt's first availability is 1/8 and she can see the pt in infusion.  Jessica notified of pt's approval. Pt's spouse verbalized understanding.

## 2023-10-24 NOTE — Telephone Encounter (Signed)
Pt has completed cycle

## 2023-10-25 ENCOUNTER — Inpatient Hospital Stay: Payer: Medicare HMO

## 2023-10-25 ENCOUNTER — Other Ambulatory Visit: Payer: Self-pay | Admitting: *Deleted

## 2023-10-25 DIAGNOSIS — C342 Malignant neoplasm of middle lobe, bronchus or lung: Secondary | ICD-10-CM | POA: Diagnosis not present

## 2023-10-25 MED ORDER — PROCHLORPERAZINE MALEATE 10 MG PO TABS: 10.0000 mg | ORAL_TABLET | Freq: Four times a day (QID) | ORAL | 1 refills | Status: DC | PRN

## 2023-10-25 MED ORDER — ONDANSETRON HCL 8 MG PO TABS: 8.0000 mg | ORAL_TABLET | Freq: Three times a day (TID) | ORAL | 1 refills | Status: DC | PRN

## 2023-10-28 ENCOUNTER — Ambulatory Visit: Payer: Medicare HMO | Admitting: Radiation Oncology

## 2023-10-28 ENCOUNTER — Other Ambulatory Visit: Payer: Self-pay

## 2023-10-28 ENCOUNTER — Ambulatory Visit
Admission: RE | Admit: 2023-10-28 | Discharge: 2023-10-28 | Discharge status: Home / Self Care | Payer: Medicare HMO | Source: Ambulatory Visit | Attending: Radiation Oncology | Admitting: Radiation Oncology

## 2023-10-28 DIAGNOSIS — C342 Malignant neoplasm of middle lobe, bronchus or lung: Secondary | ICD-10-CM | POA: Diagnosis not present

## 2023-10-28 DIAGNOSIS — Z510 Encounter for antineoplastic radiation therapy: Secondary | ICD-10-CM | POA: Diagnosis not present

## 2023-10-28 LAB — RAD ONC ARIA SESSION SUMMARY
Course Elapsed Days: 0
Plan Fractions Treated to Date: 1
Plan Prescribed Dose Per Fraction: 2 Gy
Plan Total Fractions Prescribed: 30
Plan Total Prescribed Dose: 60 Gy
Reference Point Dosage Given to Date: 2 Gy
Reference Point Session Dosage Given: 2 Gy
Session Number: 1

## 2023-10-29 ENCOUNTER — Encounter: Payer: Self-pay | Admitting: Internal Medicine

## 2023-10-29 ENCOUNTER — Inpatient Hospital Stay: Payer: Medicare HMO | Admitting: Internal Medicine

## 2023-10-29 ENCOUNTER — Inpatient Hospital Stay: Payer: Medicare HMO

## 2023-10-29 ENCOUNTER — Other Ambulatory Visit: Payer: Self-pay

## 2023-10-29 ENCOUNTER — Ambulatory Visit
Admission: RE | Admit: 2023-10-29 | Discharge: 2023-10-29 | Disposition: A | Discharge status: Home / Self Care | Payer: Medicare HMO | Source: Ambulatory Visit | Attending: Radiation Oncology | Admitting: Radiation Oncology

## 2023-10-29 ENCOUNTER — Ambulatory Visit: Payer: Medicare HMO

## 2023-10-29 ENCOUNTER — Other Ambulatory Visit: Payer: Self-pay | Admitting: Physician Assistant

## 2023-10-29 VITALS — BP 171/66 | HR 69 | Temp 97.6°F | Resp 17 | Ht 60.0 in | Wt 130.0 lb

## 2023-10-29 VITALS — BP 142/76 | HR 68 | Temp 98.4°F | Resp 18

## 2023-10-29 DIAGNOSIS — Z510 Encounter for antineoplastic radiation therapy: Secondary | ICD-10-CM | POA: Diagnosis not present

## 2023-10-29 DIAGNOSIS — Z5111 Encounter for antineoplastic chemotherapy: Secondary | ICD-10-CM | POA: Diagnosis not present

## 2023-10-29 DIAGNOSIS — C342 Malignant neoplasm of middle lobe, bronchus or lung: Secondary | ICD-10-CM | POA: Diagnosis not present

## 2023-10-29 LAB — CBC WITH DIFFERENTIAL (CANCER CENTER ONLY)
Abs Immature Granulocytes: 0.01 10*3/uL (ref 0.00–0.07)
Basophils Absolute: 0 10*3/uL (ref 0.0–0.1)
Basophils Relative: 0 %
Eosinophils Absolute: 0 10*3/uL (ref 0.0–0.5)
Eosinophils Relative: 0 %
HCT: 36.7 % (ref 36.0–46.0)
Hemoglobin: 12.1 g/dL (ref 12.0–15.0)
Immature Granulocytes: 0 %
Lymphocytes Relative: 35 %
Lymphs Abs: 2.5 10*3/uL (ref 0.7–4.0)
MCH: 25.4 pg — ABNORMAL LOW (ref 26.0–34.0)
MCHC: 33 g/dL (ref 30.0–36.0)
MCV: 77.1 fL — ABNORMAL LOW (ref 80.0–100.0)
Monocytes Absolute: 0.5 10*3/uL (ref 0.1–1.0)
Monocytes Relative: 8 %
Neutro Abs: 3.9 10*3/uL (ref 1.7–7.7)
Neutrophils Relative %: 57 %
Platelet Count: 215 10*3/uL (ref 150–400)
RBC: 4.76 MIL/uL (ref 3.87–5.11)
RDW: 14.6 % (ref 11.5–15.5)
WBC Count: 7 10*3/uL (ref 4.0–10.5)
nRBC: 0 % (ref 0.0–0.2)

## 2023-10-29 LAB — RAD ONC ARIA SESSION SUMMARY
Course Elapsed Days: 1
Plan Fractions Treated to Date: 2
Plan Prescribed Dose Per Fraction: 2 Gy
Plan Total Fractions Prescribed: 30
Plan Total Prescribed Dose: 60 Gy
Reference Point Dosage Given to Date: 4 Gy
Reference Point Session Dosage Given: 2 Gy
Session Number: 2

## 2023-10-29 LAB — CMP (CANCER CENTER ONLY)
ALT: 28 U/L (ref 0–44)
AST: 26 U/L (ref 15–41)
Albumin: 4.2 g/dL (ref 3.5–5.0)
Alkaline Phosphatase: 36 U/L — ABNORMAL LOW (ref 38–126)
Anion gap: 6 (ref 5–15)
BUN: 10 mg/dL (ref 8–23)
CO2: 30 mmol/L (ref 22–32)
Calcium: 9.5 mg/dL (ref 8.9–10.3)
Chloride: 94 mmol/L — ABNORMAL LOW (ref 98–111)
Creatinine: 0.76 mg/dL (ref 0.44–1.00)
GFR, Estimated: >60 mL/min (ref >60)
Glucose, Bld: 122 mg/dL — ABNORMAL HIGH (ref 70–99)
Potassium: 4.4 mmol/L (ref 3.5–5.1)
Sodium: 130 mmol/L — ABNORMAL LOW (ref 135–145)
Total Bilirubin: 0.5 mg/dL (ref 0.0–1.2)
Total Protein: 6.8 g/dL (ref 6.5–8.1)

## 2023-10-29 MED ORDER — DEXAMETHASONE SODIUM PHOSPHATE 10 MG/ML IJ SOLN
10.0000 mg | Freq: Once | INTRAMUSCULAR | Status: AC
  Administered 2023-10-29: 10 mg via INTRAVENOUS
  Filled 2023-10-29: qty 1

## 2023-10-29 MED ORDER — DIPHENHYDRAMINE HCL 50 MG/ML IJ SOLN
50.0000 mg | Freq: Once | INTRAMUSCULAR | Status: AC
  Administered 2023-10-29: 50 mg via INTRAVENOUS
  Filled 2023-10-29: qty 1

## 2023-10-29 MED ORDER — FAMOTIDINE IN NACL 20-0.9 MG/50ML-% IV SOLN
20.0000 mg | Freq: Once | INTRAVENOUS | Status: AC
Start: 2023-10-29 — End: 2023-10-29
  Administered 2023-10-29: 20 mg via INTRAVENOUS
  Filled 2023-10-29: qty 50

## 2023-10-29 MED ORDER — SODIUM CHLORIDE 0.9 % IV SOLN: INTRAVENOUS | Status: DC

## 2023-10-29 MED ORDER — SODIUM CHLORIDE 0.9 % IV SOLN
45.0000 mg/m2 | Freq: Once | INTRAVENOUS | Status: AC
  Administered 2023-10-29: 72 mg via INTRAVENOUS
  Filled 2023-10-29: qty 12

## 2023-10-29 MED ORDER — SODIUM CHLORIDE 0.9 % IV SOLN
147.6000 mg | Freq: Once | INTRAVENOUS | Status: AC
  Administered 2023-10-29: 150 mg via INTRAVENOUS
  Filled 2023-10-29: qty 15

## 2023-10-29 MED ORDER — PALONOSETRON HCL INJECTION 0.25 MG/5ML
0.2500 mg | Freq: Once | INTRAVENOUS | Status: AC
  Administered 2023-10-29: 0.25 mg via INTRAVENOUS
  Filled 2023-10-29: qty 5

## 2023-10-29 NOTE — Progress Notes (Signed)
 [facility] [phone]   [phone]  OFFICE PROGRESS NOTE  Ransom Other, MD [address] [place] [address]  DIAGNOSIS:  stage IIIb (T1c, N3, M0) non-small cell lung cancer, adenocarcinoma presented with right middle lobe lung nodule in addition to bilateral hilar and mediastinal lymphadenopathy diagnosed in December 2024.   Biomarker Findings HRD signature - HRDsig Negative Microsatellite status - MS-Stable Tumor Mutational Burden - 2 Muts/Mb Genomic Findings For a complete list of the genes assayed, please refer to the Appendix. EGFR G719C, E709V, amplification CDKN2A loss - equivocal? MTAP loss - equivocal? CDKN2B loss - equivocal? FGF23 R187W - subclonal? TP53 R248L 7 Disease relevant genes with no reportable alterations: ALK, BRAF, ERBB2, KRAS, MET, RET, ROS1  PDL1 Expression 30%   PRIOR THERAPY: None  CURRENT THERAPY: Concurrent chemoradiation with weekly carboplatin  for AUC of 2 and paclitaxel  45 Mg/M2.  First dose October 29, 2023.   INTERVAL HISTORY: Ashley Pratt 72 y.o. female returns to the clinic today for follow-up visit accompanied by her husband and son.Discussed the use of AI scribe software for clinical note transcription with the patient, who gave verbal consent to proceed.  History of Present Illness   The patient, a 73 year old diagnosed with stage 3B non-small cell lung cancer (adenocarcinoma) in December 2024, presents for the initiation of her first treatment with chemo and radiation. The patient's molecular markers were tested, revealing an uncommon EGFR mutation and a PD-L1 exhalation of 30%, both of which could be utilized for future treatment.  The patient reports occasional mild pain in the lower right side of her chest, where a biopsy was previously performed. She believes this to be residual pain from the biopsy procedure. The patient denies any significant breathing  difficulties or coughing, and there has been no hemoptysis. She reports a slight weight loss, but her weight remains stable compared to two weeks prior. The patient denies any nausea or vomiting.  The patient's lab work is satisfactory for the initiation of treatment. However, she reported experiencing dizziness on the day of the consultation. Previous imaging studies of the patient's brain were normal.  The patient's diet and activity levels were discussed.       MEDICAL HISTORY: Past Medical History:  Diagnosis Date   Aortic stenosis    Arthritis    Breast cancer (HCC)    Chronic back pain    Colon polyps    GERD (gastroesophageal reflux disease)    Hypertension    Malignant neoplasm of right female breast (HCC)    unspecified site of breast   Osteoarthritis    of the knee left worse than right   Prediabetes     ALLERGIES:  is allergic to aspirin, gabapentin, losartan  potassium-hctz, and penicillins.  MEDICATIONS:  Current Outpatient Medications  Medication Sig Dispense Refill   losartan  (COZAAR ) 100 MG tablet Take 100 mg by mouth daily.     magnesium gluconate (MAGONATE) 500 MG tablet Take 500 mg by mouth 2 (two) times daily.     Multiple Vitamin (MULTIVITAMIN) capsule Take 1 capsule by mouth daily.     ondansetron  (ZOFRAN ) 8 MG tablet Take 1 tablet (8 mg total) by mouth every 8 (eight) hours as needed for nausea or vomiting. Start on the third day after chemotherapy. 30 tablet 1   pantoprazole (PROTONIX) 20 MG tablet Take 20 mg by mouth daily.     prochlorperazine  (COMPAZINE ) 10 MG tablet Take 1 tablet (10 mg  total) by mouth every 6 (six) hours as needed for nausea or vomiting. 30 tablet 1   No current facility-administered medications for this visit.    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   BREAST  LUMPECTOMY WITH NEEDLE LOCALIZATION Right 05/11/2013   Procedure: RIGHT BREAST NEEDLE LOCALIZATION  LUMPECTOMY;  Surgeon: Jina Nephew, MD;  Location: [facility];  Service: General;  Laterality: Right;   BREAST LUMPECTOMY WITH RADIOACTIVE SEED LOCALIZATION Right 08/02/2020   Procedure: RIGHT BREAST LUMPECTOMY WITH RADIOACTIVE SEED LOCALIZATION;  Surgeon: Nephew Jina, MD;  Location: [facility];  Service: General;  Laterality: Right;  RNFA   BRONCHIAL BIOPSY  10/07/2023   Procedure: BRONCHIAL BIOPSIES;  Surgeon: Shelah Lamar RAMAN, MD;  Location: [facility];  Service: Pulmonary;;   BRONCHIAL BRUSHINGS  10/07/2023   Procedure: BRONCHIAL BRUSHINGS;  Surgeon: Shelah Lamar RAMAN, MD;  Location: [facility] ENDOSCOPY;  Service: Pulmonary;;   BRONCHIAL NEEDLE ASPIRATION BIOPSY  10/07/2023   Procedure: BRONCHIAL NEEDLE ASPIRATION BIOPSIES;  Surgeon: Shelah Lamar RAMAN, MD;  Location: [facility] ENDOSCOPY;  Service: Pulmonary;;   CHOLECYSTECTOMY     COLONOSCOPY     FOOT OSTEOTOMY     both  feet   TONSILLECTOMY     VIDEO BRONCHOSCOPY WITH ENDOBRONCHIAL ULTRASOUND N/A 10/07/2023   Procedure: VIDEO BRONCHOSCOPY WITH ENDOBRONCHIAL ULTRASOUND;  Surgeon: Shelah Lamar RAMAN, MD;  Location: [facility] ENDOSCOPY;  Service: Pulmonary;  Laterality: N/A;    REVIEW OF SYSTEMS:  Constitutional: positive for fatigue Eyes: negative Ears, nose, mouth, throat, and face: negative Respiratory: positive for pleurisy/chest pain Cardiovascular: negative Gastrointestinal: negative Genitourinary:negative Integument/breast: negative Hematologic/lymphatic: negative Musculoskeletal:negative Neurological: positive for dizziness Behavioral/Psych: negative Endocrine: negative Allergic/Immunologic: negative   PHYSICAL EXAMINATION: General appearance: alert, cooperative, and no distress Head: Normocephalic, without obvious abnormality, atraumatic Neck: no adenopathy, no JVD, supple, symmetrical, trachea midline, and thyroid  not  enlarged, symmetric, no tenderness/mass/nodules Lymph nodes: Cervical, supraclavicular, and axillary nodes normal. Resp: clear to auscultation bilaterally Back: symmetric, no curvature. ROM normal. No CVA tenderness. Cardio: regular rate and rhythm, S1, S2 normal, no murmur, click, rub or gallop GI: soft, non-tender; bowel sounds normal; no masses,  no organomegaly Extremities: extremities normal, atraumatic, no cyanosis or edema Neurologic: Alert and oriented X 3, normal strength and tone. Normal symmetric reflexes. Normal coordination and gait  ECOG PERFORMANCE STATUS: 1 - Symptomatic but completely ambulatory  Blood pressure (!) 171/66, pulse 69, temperature 97.6 F (36.4 C), temperature source Temporal, resp. rate 17, height 5' (1.524 m), weight 130 lb (59 kg), SpO2 100%.  LABORATORY DATA: Lab Results  Component Value Date   WBC 7.0 10/29/2023   HGB 12.1 10/29/2023   HCT 36.7 10/29/2023   MCV 77.1 (L) 10/29/2023   PLT 215 10/29/2023      Chemistry      Component Value Date/Time   NA 130 (L) 10/29/2023 1033   NA 139 10/17/2015 1322   K 4.4 10/29/2023 1033   K 4.1 10/17/2015 1322   CL 94 (L) 10/29/2023 1033   CO2 30 10/29/2023 1033   CO2 28 10/17/2015 1322   BUN 10 10/29/2023 1033   BUN 14.5 10/17/2015 1322   CREATININE 0.76 10/29/2023 1033   CREATININE 0.87 04/10/2016 1540   CREATININE 0.9 10/17/2015 1322      Component Value Date/Time   CALCIUM 9.5  10/29/2023 1033   CALCIUM 10.1 10/17/2015 1322   ALKPHOS 36 (L) 10/29/2023 1033   ALKPHOS 103 10/17/2015 1322   AST 26 10/29/2023 1033   AST 24 10/17/2015 1322   ALT 28 10/29/2023 1033   ALT 32 10/17/2015 1322   BILITOT 0.5 10/29/2023 1033   BILITOT 0.33 10/17/2015 1322       RADIOGRAPHIC STUDIES: CT CHEST ABDOMEN PELVIS W CONTRAST Result Date: 10/22/2023 CLINICAL DATA:  Non-small-cell lung cancer, restaging. History of breast cancer. * Tracking Code: BO * EXAM: CT CHEST, ABDOMEN, AND PELVIS WITH CONTRAST  TECHNIQUE: Multidetector CT imaging of the chest, abdomen and pelvis was performed following the standard protocol during bolus administration of intravenous contrast. RADIATION DOSE REDUCTION: This exam was performed according to the departmental dose-optimization program which includes automated exposure control, adjustment of the mA and/or kV according to patient size and/or use of iterative reconstruction technique. CONTRAST:  OMNIPAQUE  IOHEXOL  300 MG/ML  SOLN COMPARISON:  10/04/2023 chest CT. PET 08/08/2023. clinic note of 10/18/2023 reviewed. FINDINGS: CT CHEST FINDINGS Cardiovascular: Duplicated SVC. Aortic atherosclerosis. Tortuous thoracic aorta. Mild cardiomegaly, without pericardial effusion. No central pulmonary embolism, on this non-dedicated study. Mediastinum/Nodes: No supraclavicular adenopathy. Precarinal 9 mm node on 24/2 is unchanged. Low right paratracheal 8 mm node on 22/2 is also similar. No hilar adenopathy. Mild esophageal dilatation with fluid level within on 32/2. Lungs/Pleura: No pleural fluid. Posterolateral right middle lobe pulmonary nodule measures 2.3 x 2.1 cm on 75/4, similar to 2.3 x 2.0 cm on the prior. Musculoskeletal: Included within the abdomen pelvic section. CT ABDOMEN PELVIS FINDINGS Hepatobiliary: Normal liver. Cholecystectomy, without biliary ductal dilatation. Pancreas: Normal, without mass or ductal dilatation. Spleen: Normal in size, without focal abnormality. Adrenals/Urinary Tract: Normal adrenal glands. 3 mm interpolar left renal collecting system calculus. Upper pole left renal 9 mm cyst or minimally complex cyst . In the absence of clinically indicated signs/symptoms require(s) no independent follow-up. Normal right kidney. No hydronephrosis. Normal urinary bladder. Stomach/Bowel: Proximal gastric underdistention. Colonic stool burden suggests constipation. Normal terminal ileum and appendix. Normal small bowel. Vascular/Lymphatic: Aortic atherosclerosis. No  abdominopelvic adenopathy. Reproductive: Normal uterus and adnexa. Other: No significant free fluid. No evidence of omental or peritoneal disease. Musculoskeletal: Right-sided lumpectomy Trace L4-5 anterolisthesis. Mild motion degradation throughout the abdomen and upper pelvis. IMPRESSION: 1. Relatively similar appearance of right middle lobe lung nodule and small mediastinal nodes which were hypermetabolic on PET. 2. No acute process or evidence of metastatic disease within the abdomen or pelvis. 3. Incidental findings, including: Aortic Atherosclerosis (ICD10-I70.0). Left nephrolithiasis. Duplicated SVC. Esophageal air fluid level suggests dysmotility or gastroesophageal reflux. Electronically Signed   By: Rockey Kilts M.D.   On: 10/22/2023 10:43   CT Super D Chest Wo Contrast Result Date: 10/15/2023 CLINICAL DATA:  Follow-up FDG avid right middle lobe nodule, additional history of breast cancer * Tracking Code: BO * EXAM: CT CHEST WITHOUT CONTRAST TECHNIQUE: Multidetector CT imaging of the chest was performed using thin slice collimation for electromagnetic bronchoscopy planning purposes, without intravenous contrast. RADIATION DOSE REDUCTION: This exam was performed according to the departmental dose-optimization program which includes automated exposure control, adjustment of the mA and/or kV according to patient size and/or use of iterative reconstruction technique. COMPARISON:  PET-CT, 08/08/2023 FINDINGS: Cardiovascular: Aortic atherosclerosis. Cardiomegaly. No pericardial effusion. Mediastinum/Nodes: Unchanged enlarged mediastinal and hilar lymph nodes, pretracheal nodes measuring up to 1.4 x 1.0 cm (series 3, image 67). Incidental note of vascular variant persistent left-sided SVC (series 3, image 42). Thyroid   gland, trachea, and esophagus demonstrate no significant findings. Lungs/Pleura: Stable, or perhaps minimally enlarged mass abutting the fissure in the lateral segment right middle lobe  measuring 2.3 x 2.0 cm, previously 2.2 x 1.8 cm when measured with similar technique (series 4, image 75). Unchanged, scattered bronchiolar plugging and centrilobular and tree-in-bud nodularity in the inferior right middle lobe (series 4, image 99). No pleural effusion or pneumothorax. Upper Abdomen: No acute abnormality. Musculoskeletal: Status post right lumpectomy. No acute osseous findings. IMPRESSION: 1. Stable, or perhaps minimally enlarged mass abutting the fissure in the lateral segment right middle lobe measuring 2.3 x 2.0 cm, previously 2.2 x 1.8 cm when measured with similar technique. This remains presumed primary lung malignancy. 2. Unchanged enlarged metastatic mediastinal and hilar lymph nodes. 3. Unchanged, scattered bronchiolar plugging and centrilobular and tree-in-bud nodularity in the inferior right middle lobe, consistent with atypical infection, particularly atypical mycobacterium. 4. Cardiomegaly. Aortic Atherosclerosis (ICD10-I70.0). Electronically Signed   By: Marolyn JONETTA Jaksch M.D.   On: 10/15/2023 14:13   MR Brain W Wo Contrast Result Date: 10/12/2023 CLINICAL DATA:  Non-small cell lung cancer (NSCLC), staging EXAM: MRI HEAD WITHOUT AND WITH CONTRAST TECHNIQUE: Multiplanar, multiecho pulse sequences of the brain and surrounding structures were obtained without and with intravenous contrast. CONTRAST:  6mL GADAVIST  GADOBUTROL  1 MMOL/ML IV SOLN COMPARISON:  MRI August 03, 2021 FINDINGS: Brain: No acute infarction, hemorrhage, hydrocephalus, extra-axial collection or mass lesion. No pathologic enhancement. Vascular: Major arterial flow voids are maintained at the skull base. Skull and upper cervical spine: Normal marrow signal. Sinuses/Orbits: Negative. IMPRESSION: No evidence of acute intracranial abnormality or metastatic disease. Electronically Signed   By: Gilmore GORMAN Molt M.D.   On: 10/12/2023 16:53   DG Chest Port 1 View Result Date: 10/07/2023 CLINICAL DATA:  Status post  bronchoscopy with biopsy. EXAM: PORTABLE CHEST 1 VIEW COMPARISON:  Mar 26, 2019.  August 08, 2023. FINDINGS: Stable cardiomediastinal silhouette. No definite pneumothorax or pleural effusion is noted. Stable nodular density seen in right midlung consistent with probable malignancy noted on recent PET scan. Left lung is unremarkable. IMPRESSION: No definite pneumothorax status post bronchoscopy and biopsy. Electronically Signed   By: Lynwood Landy Raddle M.D.   On: 10/07/2023 09:33   DG C-ARM BRONCHOSCOPY Result Date: 10/07/2023 C-ARM BRONCHOSCOPY: Fluoroscopy was utilized by the requesting physician.  No radiographic interpretation.    ASSESSMENT AND PLAN: This is a very pleasant 72 years old female with stage IIIb (T1c, N3, M0) non-small cell lung cancer, adenocarcinoma presented with right middle lobe lung nodule in addition to bilateral hilar and mediastinal lymphadenopathy diagnosed in December 2024.  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 She is currently on concurrent chemoradiation with weekly carboplatin  for AUC of 2 and paclitaxel  45 Mg/M2.  First dose October 29, 2023.     Stage III B Non-Small Cell Lung Cancer (NSCLC) Diagnosed December 2024 with stage III B NSCLC, adenocarcinoma subtype. Uncommon EGFR mutation and 30% PD-L1 expression identified. Initiating concurrent chemoradiation with weekly carboplatin  and paclitaxel , and daily radiation (Monday-Friday) for 6.5 weeks. Mild biopsy-related pain in lower right lung, no significant weight loss, nausea, vomiting, or hemoptysis. Normal breathing, no cough, dizziness with normal brain imaging. Treatment aims to reduce tumor size and alleviate symptoms. Discussed treatment risks, including potential kidney impact from high-protein diets and importance of maintaining weight to avoid weight loss from radiation-induced esophagitis. Advised balanced diet and regular  physical activity; fasting and keto diets  not recommended at this time. - Administer weekly carboplatin  and paclitaxel  - Administer daily radiation therapy Monday through Friday - Schedule follow-up scan after 6.5 weeks of treatment - Evaluate for targeted therapy or immunotherapy post-treatment - Ensure hydration and manage anxiety to prevent dizziness - Encourage a regular, healthy diet to maintain weight - Avoid high-protein diets and fasting during treatment - Encourage moderate sun exposure with sunscreen - Promote daily physical activity as tolerated - Schedule visits every other week during treatment - Coordinate with nutrition services for dietary support  Biopsy-related Pain Mild pain in lower right lung, likely biopsy-related. Expected to improve over time. - Monitor pain levels - Provide supportive care as needed  General Health Maintenance Emphasized importance of healthy diet and regular physical activity. Advised against high-protein diets, fasting, or keto diets during treatment. Encouraged moderate sun exposure with sunscreen. - Encourage a balanced diet with fruits, vegetables, and moderate protein - Avoid high-protein diets and fasting - Promote daily physical activity as tolerated - Encourage moderate sun exposure with sunscreen  Follow-up - Schedule chemotherapy every Monday - Schedule radiation therapy Monday through Friday - Arrange follow-up visits every other week - Coordinate with nutrition services for dietary support - Adjust treatment schedule as needed to reduce confusion.   The patient was advised to call immediately if she has any other concerning symptoms in the interval. The patient voices understanding of current disease status and treatment options and is in agreement with the current care plan.  All questions were answered. The patient knows to call the clinic with any problems, questions or concerns. We can certainly see the patient much sooner  if necessary.  The total time spent in the appointment was 30 minutes.  Disclaimer: This note was dictated with voice recognition software. Similar sounding words can inadvertently be transcribed and may not be corrected upon review.

## 2023-10-29 NOTE — Patient Instructions (Addendum)
 [facility] - A DEPT OF [facility]  Discharge Instructions: Thank you for choosing [facility] to provide your oncology and hematology care.   If you have a lab appointment with the Cancer Center, please go directly to the Cancer Center and check in at the registration area.   Wear comfortable clothing and clothing appropriate for easy access to any Portacath or PICC line.   We strive to give you quality time with your provider. You may need to reschedule your appointment if you arrive late (15 or more minutes).  Arriving late affects you and other patients whose appointments are after yours.  Also, if you miss three or more appointments without notifying the office, you may be dismissed from the clinic at the provider's discretion.      For prescription refill requests, have your pharmacy contact our office and allow 72 hours for refills to be completed.    Today you received the following chemotherapy and/or immunotherapy agents paclitaxol, and carboplatin ,      To help prevent nausea and vomiting after your treatment, we encourage you to take your nausea medication as directed.  BELOW ARE SYMPTOMS THAT SHOULD BE REPORTED IMMEDIATELY: *FEVER GREATER THAN 100.4 F (38 C) OR HIGHER *CHILLS OR SWEATING *NAUSEA AND VOMITING THAT IS NOT CONTROLLED WITH YOUR NAUSEA MEDICATION *UNUSUAL SHORTNESS OF BREATH *UNUSUAL BRUISING OR BLEEDING *URINARY PROBLEMS (pain or burning when urinating, or frequent urination) *BOWEL PROBLEMS (unusual diarrhea, constipation, pain near the anus) TENDERNESS IN MOUTH AND THROAT WITH OR WITHOUT PRESENCE OF ULCERS (sore throat, sores in mouth, or a toothache) UNUSUAL RASH, SWELLING OR PAIN  UNUSUAL VAGINAL DISCHARGE OR ITCHING   Items with * indicate a potential emergency and should be followed up as soon as possible or go to the Emergency Department if any problems should occur.  Please show the CHEMOTHERAPY ALERT CARD  or IMMUNOTHERAPY ALERT CARD at check-in to the Emergency Department and triage nurse.  Should you have questions after your visit or need to cancel or reschedule your appointment, please contact [facility]  Dept: [phone]  and follow the prompts.  Office hours are 8:00 a.m. to 4:30 p.m. Monday - Friday. Please note that voicemails left after 4:00 p.m. may not be returned until the following business day.  We are closed weekends and major holidays. You have access to a nurse at all times for urgent questions. Please call the main number to the clinic Dept: [phone] and follow the prompts.   For any non-urgent questions, you may also contact your provider using MyChart. We now offer e-Visits for anyone 78 and older to request care online for non-urgent symptoms. For details visit [email or web address].   Also download the MyChart app! Go to the app store, search MyChart, open the app, select [facility], and log in with your MyChart username and password.  Paclitaxel  Injection What is this medication? PACLITAXEL  (PAK li TAX el) treats some types of cancer. It works by slowing down the growth of cancer cells. This medicine may be used for other purposes; ask your health care provider or pharmacist if you have questions. COMMON BRAND NAME(S): Onxol, Taxol  What should I tell my care team before I take this medication? They need to know if you have any of these conditions: Heart disease Liver disease Low white blood cell levels An unusual or allergic reaction to paclitaxel , other  medications, foods, dyes, or preservatives If you or your partner are pregnant or trying to get pregnant Breast-feeding How should I use this medication? This medication is injected into a vein. It is given by your care team in a hospital or clinic setting. Talk to your care team about the use of this medication in children. While it may be given to  children for selected conditions, precautions do apply. Overdosage: If you think you have taken too much of this medicine contact a poison control center or emergency room at once. NOTE: This medicine is only for you. Do not share this medicine with others. What if I miss a dose? Keep appointments for follow-up doses. It is important not to miss your dose. Call your care team if you are unable to keep an appointment. What may interact with this medication? Do not take this medication with any of the following: Live virus vaccines Other medications may affect the way this medication works. Talk with your care team about all of the medications you take. They may suggest changes to your treatment plan to lower the risk of side effects and to make sure your medications work as intended. This list may not describe all possible interactions. Give your health care provider a list of all the medicines, herbs, non-prescription drugs, or dietary supplements you use. Also tell them if you smoke, drink alcohol, or use illegal drugs. Some items may interact with your medicine. What should I watch for while using this medication? Your condition will be monitored carefully while you are receiving this medication. You may need blood work while taking this medication. This medication may make you feel generally unwell. This is not uncommon as chemotherapy can affect healthy cells as well as cancer cells. Report any side effects. Continue your course of treatment even though you feel ill unless your care team tells you to stop. This medication can cause serious allergic reactions. To reduce the risk, your care team may give you other medications to take before receiving this one. Be sure to follow the directions from your care team. This medication may increase your risk of getting an infection. Call your care team for advice if you get a fever, chills, sore throat, or other symptoms of a cold or flu. Do not treat  yourself. Try to avoid being around people who are sick. This medication may increase your risk to bruise or bleed. Call your care team if you notice any unusual bleeding. Be careful brushing or flossing your teeth or using a toothpick because you may get an infection or bleed more easily. If you have any dental work done, tell your dentist you are receiving this medication. Talk to your care team if you may be pregnant. Serious birth defects can occur if you take this medication during pregnancy. Talk to your care team before breastfeeding. Changes to your treatment plan may be needed. What side effects may I notice from receiving this medication? Side effects that you should report to your care team as soon as possible: Allergic reactions--skin rash, itching, hives, swelling of the face, lips, tongue, or throat Heart rhythm changes--fast or irregular heartbeat, dizziness, feeling faint or lightheaded, chest pain, trouble breathing Increase in blood pressure Infection--fever, chills, cough, sore throat, wounds that don't heal, pain or trouble when passing urine, general feeling of discomfort or being unwell Low blood pressure--dizziness, feeling faint or lightheaded, blurry vision Low red blood cell level--unusual weakness or fatigue, dizziness, headache, trouble breathing Painful swelling, warmth, or  redness of the skin, blisters or sores at the infusion site Pain, tingling, or numbness in the hands or feet Slow heartbeat--dizziness, feeling faint or lightheaded, confusion, trouble breathing, unusual weakness or fatigue Unusual bruising or bleeding Side effects that usually do not require medical attention (report to your care team if they continue or are bothersome): Diarrhea Hair loss Joint pain Loss of appetite Muscle pain Nausea Vomiting This list may not describe all possible side effects. Call your doctor for medical advice about side effects. You may report side effects to FDA at  1-800-FDA-1088. Where should I keep my medication? This medication is given in a hospital or clinic. It will not be stored at home. NOTE: This sheet is a summary. It may not cover all possible information. If you have questions about this medicine, talk to your doctor, pharmacist, or health care provider.  2024 Elsevier/Gold Standard (2022-03-06 00:00:00)  Carboplatin  Injection What is this medication? CARBOPLATIN  (KAR boe pla tin) treats some types of cancer. It works by slowing down the growth of cancer cells. This medicine may be used for other purposes; ask your health care provider or pharmacist if you have questions. COMMON BRAND NAME(S): Paraplatin  What should I tell my care team before I take this medication? They need to know if you have any of these conditions: Blood disorders Hearing problems Kidney disease Recent or ongoing radiation therapy An unusual or allergic reaction to carboplatin , cisplatin, other medications, foods, dyes, or preservatives Pregnant or trying to get pregnant Breast-feeding How should I use this medication? This medication is injected into a vein. It is given by your care team in a hospital or clinic setting. Talk to your care team about the use of this medication in children. Special care may be needed. Overdosage: If you think you have taken too much of this medicine contact a poison control center or emergency room at once. NOTE: This medicine is only for you. Do not share this medicine with others. What if I miss a dose? Keep appointments for follow-up doses. It is important not to miss your dose. Call your care team if you are unable to keep an appointment. What may interact with this medication? Medications for seizures Some antibiotics, such as amikacin, gentamicin, neomycin, streptomycin, tobramycin Vaccines This list may not describe all possible interactions. Give your health care provider a list of all the medicines, herbs,  non-prescription drugs, or dietary supplements you use. Also tell them if you smoke, drink alcohol, or use illegal drugs. Some items may interact with your medicine. What should I watch for while using this medication? Your condition will be monitored carefully while you are receiving this medication. You may need blood work while taking this medication. This medication may make you feel generally unwell. This is not uncommon, as chemotherapy can affect healthy cells as well as cancer cells. Report any side effects. Continue your course of treatment even though you feel ill unless your care team tells you to stop. In some cases, you may be given additional medications to help with side effects. Follow all directions for their use. This medication may increase your risk of getting an infection. Call your care team for advice if you get a fever, chills, sore throat, or other symptoms of a cold or flu. Do not treat yourself. Try to avoid being around people who are sick. Avoid taking medications that contain aspirin, acetaminophen , ibuprofen, naproxen, or ketoprofen unless instructed by your care team. These medications may hide a fever. Be  careful brushing or flossing your teeth or using a toothpick because you may get an infection or bleed more easily. If you have any dental work done, tell your dentist you are receiving this medication. Talk to your care team if you wish to become pregnant or think you might be pregnant. This medication can cause serious birth defects. Talk to your care team about effective forms of contraception. Do not breast-feed while taking this medication. What side effects may I notice from receiving this medication? Side effects that you should report to your care team as soon as possible: Allergic reactions--skin rash, itching, hives, swelling of the face, lips, tongue, or throat Infection--fever, chills, cough, sore throat, wounds that don't heal, pain or trouble when passing  urine, general feeling of discomfort or being unwell Low red blood cell level--unusual weakness or fatigue, dizziness, headache, trouble breathing Pain, tingling, or numbness in the hands or feet, muscle weakness, change in vision, confusion or trouble speaking, loss of balance or coordination, trouble walking, seizures Unusual bruising or bleeding Side effects that usually do not require medical attention (report to your care team if they continue or are bothersome): Hair loss Nausea Unusual weakness or fatigue Vomiting This list may not describe all possible side effects. Call your doctor for medical advice about side effects. You may report side effects to FDA at 1-800-FDA-1088. Where should I keep my medication? This medication is given in a hospital or clinic. It will not be stored at home. NOTE: This sheet is a summary. It may not cover all possible information. If you have questions about this medicine, talk to your doctor, pharmacist, or health care provider.  2024 Elsevier/Gold Standard (2022-02-06 00:00:00)

## 2023-10-31 ENCOUNTER — Telehealth: Payer: Self-pay

## 2023-10-31 ENCOUNTER — Other Ambulatory Visit: Payer: Self-pay

## 2023-10-31 ENCOUNTER — Ambulatory Visit
Admission: RE | Admit: 2023-10-31 | Discharge: 2023-10-31 | Disposition: A | Discharge status: Home / Self Care | Payer: Medicare HMO | Source: Ambulatory Visit | Attending: Radiation Oncology | Admitting: Radiation Oncology

## 2023-10-31 ENCOUNTER — Ambulatory Visit: Payer: Medicare HMO

## 2023-10-31 DIAGNOSIS — C342 Malignant neoplasm of middle lobe, bronchus or lung: Secondary | ICD-10-CM | POA: Insufficient documentation

## 2023-10-31 DIAGNOSIS — Z510 Encounter for antineoplastic radiation therapy: Secondary | ICD-10-CM | POA: Diagnosis not present

## 2023-10-31 LAB — RAD ONC ARIA SESSION SUMMARY
Course Elapsed Days: 3
Plan Fractions Treated to Date: 3
Plan Prescribed Dose Per Fraction: 2 Gy
Plan Total Fractions Prescribed: 30
Plan Total Prescribed Dose: 60 Gy
Reference Point Dosage Given to Date: 6 Gy
Reference Point Session Dosage Given: 2 Gy
Session Number: 3

## 2023-10-31 NOTE — Telephone Encounter (Signed)
-----   Message from Nurse Lanora Manis A sent at 10/29/2023  5:31 PM EST ----- Regarding: First timer 10/29/23- First time taxol, carboplatin. Dr. Arbutus Ped. Tolerated well.

## 2023-10-31 NOTE — Telephone Encounter (Signed)
 Mr. Bassinger states that Gwendola is doing fine. She is eating, drinking, and urinating well. They know to call the office at [phone] if  they have any questions or concerns.

## 2023-11-01 ENCOUNTER — Ambulatory Visit
Admission: RE | Admit: 2023-11-01 | Discharge: 2023-11-01 | Disposition: A | Discharge status: Home / Self Care | Payer: Medicare HMO | Source: Ambulatory Visit | Attending: Radiation Oncology

## 2023-11-01 ENCOUNTER — Other Ambulatory Visit: Payer: Self-pay

## 2023-11-01 ENCOUNTER — Other Ambulatory Visit: Payer: Self-pay | Admitting: Internal Medicine

## 2023-11-01 ENCOUNTER — Other Ambulatory Visit: Payer: Self-pay | Admitting: Physician Assistant

## 2023-11-01 ENCOUNTER — Ambulatory Visit: Payer: Medicare HMO

## 2023-11-01 ENCOUNTER — Ambulatory Visit
Admission: RE | Admit: 2023-11-01 | Discharge: 2023-11-01 | Disposition: A | Discharge status: Home / Self Care | Payer: Medicare HMO | Source: Ambulatory Visit | Attending: Radiation Oncology | Admitting: Radiation Oncology

## 2023-11-01 DIAGNOSIS — C342 Malignant neoplasm of middle lobe, bronchus or lung: Secondary | ICD-10-CM | POA: Diagnosis not present

## 2023-11-01 DIAGNOSIS — Z510 Encounter for antineoplastic radiation therapy: Secondary | ICD-10-CM | POA: Diagnosis not present

## 2023-11-01 LAB — RAD ONC ARIA SESSION SUMMARY
Course Elapsed Days: 4
Plan Fractions Treated to Date: 4
Plan Prescribed Dose Per Fraction: 2 Gy
Plan Total Fractions Prescribed: 30
Plan Total Prescribed Dose: 60 Gy
Reference Point Dosage Given to Date: 8 Gy
Reference Point Session Dosage Given: 2 Gy
Session Number: 4

## 2023-11-01 MED ORDER — LIDOCAINE-PRILOCAINE 2.5-2.5 % EX CREA: 1.0000 | TOPICAL_CREAM | CUTANEOUS | 2 refills | Status: AC | PRN

## 2023-11-01 MED ORDER — SONAFINE EX EMUL
1.0000 | Freq: Once | CUTANEOUS | Status: AC
  Administered 2023-11-01: 1 via TOPICAL

## 2023-11-01 NOTE — Progress Notes (Signed)
 Per IB message string, pt would like a port. Order placed by Dr sherrod. I reached out to [facility] who was able to arrange for the port placement to take place on 1/9 at 2:30.  I called the pt's husband and let him know about the date and time and instructions regarding food and drink prior to the procedure. Pts husband requested I speak to the pts son and explain instructions to him. I explained that the pt can eat until until midnight on 1/8. After midnight on 1/9, she cannot have food and clear liquids until 8AM. I let him know she needs to have a driver and to check into the [facility] radiology department at 12:30 on 1/9 and that the port placement will take place at 2:30pm. She has to stay with a responsible adult for 24hrs after the procedure to monitor for any adverse effects to the sedation she will be given. Pts son read back the instructions I provided and denied additional questions at this time.

## 2023-11-04 ENCOUNTER — Telehealth: Payer: Self-pay | Admitting: Internal Medicine

## 2023-11-04 ENCOUNTER — Other Ambulatory Visit: Payer: Self-pay

## 2023-11-04 ENCOUNTER — Ambulatory Visit
Admission: RE | Admit: 2023-11-04 | Discharge: 2023-11-04 | Disposition: A | Discharge status: Home / Self Care | Payer: Medicare HMO | Source: Ambulatory Visit | Attending: Radiation Oncology | Admitting: Radiation Oncology

## 2023-11-04 DIAGNOSIS — C342 Malignant neoplasm of middle lobe, bronchus or lung: Secondary | ICD-10-CM | POA: Diagnosis not present

## 2023-11-04 DIAGNOSIS — Z510 Encounter for antineoplastic radiation therapy: Secondary | ICD-10-CM | POA: Diagnosis not present

## 2023-11-04 LAB — RAD ONC ARIA SESSION SUMMARY
Course Elapsed Days: 7
Plan Fractions Treated to Date: 5
Plan Prescribed Dose Per Fraction: 2 Gy
Plan Total Fractions Prescribed: 30
Plan Total Prescribed Dose: 60 Gy
Reference Point Dosage Given to Date: 10 Gy
Reference Point Session Dosage Given: 2 Gy
Session Number: 5

## 2023-11-04 NOTE — Telephone Encounter (Signed)
 Patient's spouse is aware of appointment changes on 11/12/2023 due to staff meeting in infusion

## 2023-11-05 ENCOUNTER — Ambulatory Visit
Admission: RE | Admit: 2023-11-05 | Discharge: 2023-11-05 | Disposition: A | Discharge status: Home / Self Care | Payer: Medicare HMO | Source: Ambulatory Visit | Attending: Radiation Oncology | Admitting: Radiation Oncology

## 2023-11-05 ENCOUNTER — Inpatient Hospital Stay: Payer: Medicare HMO | Attending: Hematology

## 2023-11-05 ENCOUNTER — Encounter: Payer: Medicare HMO | Admitting: Nutrition

## 2023-11-05 ENCOUNTER — Other Ambulatory Visit: Payer: Self-pay

## 2023-11-05 ENCOUNTER — Ambulatory Visit: Payer: Medicare HMO | Admitting: Nurse Practitioner

## 2023-11-05 DIAGNOSIS — D72819 Decreased white blood cell count, unspecified: Secondary | ICD-10-CM | POA: Diagnosis not present

## 2023-11-05 DIAGNOSIS — Z510 Encounter for antineoplastic radiation therapy: Secondary | ICD-10-CM | POA: Diagnosis not present

## 2023-11-05 DIAGNOSIS — Z79899 Other long term (current) drug therapy: Secondary | ICD-10-CM | POA: Insufficient documentation

## 2023-11-05 DIAGNOSIS — K2100 Gastro-esophageal reflux disease with esophagitis, without bleeding: Secondary | ICD-10-CM | POA: Diagnosis not present

## 2023-11-05 DIAGNOSIS — Z5111 Encounter for antineoplastic chemotherapy: Secondary | ICD-10-CM | POA: Diagnosis present

## 2023-11-05 DIAGNOSIS — C342 Malignant neoplasm of middle lobe, bronchus or lung: Secondary | ICD-10-CM | POA: Diagnosis not present

## 2023-11-05 LAB — RAD ONC ARIA SESSION SUMMARY
Course Elapsed Days: 8
Plan Fractions Treated to Date: 6
Plan Prescribed Dose Per Fraction: 2 Gy
Plan Total Fractions Prescribed: 30
Plan Total Prescribed Dose: 60 Gy
Reference Point Dosage Given to Date: 12 Gy
Reference Point Session Dosage Given: 2 Gy
Session Number: 6

## 2023-11-05 LAB — CBC WITH DIFFERENTIAL (CANCER CENTER ONLY)
Abs Immature Granulocytes: 0.04 10*3/uL (ref 0.00–0.07)
Basophils Absolute: 0 10*3/uL (ref 0.0–0.1)
Basophils Relative: 0 %
Eosinophils Absolute: 0.1 10*3/uL (ref 0.0–0.5)
Eosinophils Relative: 1 %
HCT: 35.4 % — ABNORMAL LOW (ref 36.0–46.0)
Hemoglobin: 11.8 g/dL — ABNORMAL LOW (ref 12.0–15.0)
Immature Granulocytes: 1 %
Lymphocytes Relative: 33 %
Lymphs Abs: 1.6 10*3/uL (ref 0.7–4.0)
MCH: 25.4 pg — ABNORMAL LOW (ref 26.0–34.0)
MCHC: 33.3 g/dL (ref 30.0–36.0)
MCV: 76.3 fL — ABNORMAL LOW (ref 80.0–100.0)
Monocytes Absolute: 0.2 10*3/uL (ref 0.1–1.0)
Monocytes Relative: 4 %
Neutro Abs: 3 10*3/uL (ref 1.7–7.7)
Neutrophils Relative %: 61 %
Platelet Count: 169 10*3/uL (ref 150–400)
RBC: 4.64 MIL/uL (ref 3.87–5.11)
RDW: 14.3 % (ref 11.5–15.5)
WBC Count: 4.9 10*3/uL (ref 4.0–10.5)
nRBC: 0 % (ref 0.0–0.2)

## 2023-11-05 LAB — CMP (CANCER CENTER ONLY)
ALT: 22 U/L (ref 0–44)
AST: 20 U/L (ref 15–41)
Albumin: 4 g/dL (ref 3.5–5.0)
Alkaline Phosphatase: 32 U/L — ABNORMAL LOW (ref 38–126)
Anion gap: 5 (ref 5–15)
BUN: 11 mg/dL (ref 8–23)
CO2: 30 mmol/L (ref 22–32)
Calcium: 9 mg/dL (ref 8.9–10.3)
Chloride: 94 mmol/L — ABNORMAL LOW (ref 98–111)
Creatinine: 0.69 mg/dL (ref 0.44–1.00)
GFR, Estimated: >60 mL/min (ref >60)
Glucose, Bld: 118 mg/dL — ABNORMAL HIGH (ref 70–99)
Potassium: 4.1 mmol/L (ref 3.5–5.1)
Sodium: 129 mmol/L — ABNORMAL LOW (ref 135–145)
Total Bilirubin: 0.6 mg/dL (ref 0.0–1.2)
Total Protein: 6.5 g/dL (ref 6.5–8.1)

## 2023-11-06 ENCOUNTER — Inpatient Hospital Stay: Payer: Medicare HMO

## 2023-11-06 ENCOUNTER — Encounter: Payer: Self-pay | Admitting: Medical Oncology

## 2023-11-06 ENCOUNTER — Other Ambulatory Visit: Payer: Self-pay

## 2023-11-06 ENCOUNTER — Ambulatory Visit: Payer: Medicare HMO | Admitting: Emergency Medicine

## 2023-11-06 ENCOUNTER — Inpatient Hospital Stay: Payer: Medicare HMO | Admitting: Dietician

## 2023-11-06 ENCOUNTER — Other Ambulatory Visit: Payer: Self-pay | Admitting: Radiology

## 2023-11-06 ENCOUNTER — Ambulatory Visit
Admission: RE | Admit: 2023-11-06 | Discharge: 2023-11-06 | Disposition: A | Discharge status: Home / Self Care | Payer: Medicare HMO | Source: Ambulatory Visit | Attending: Radiation Oncology | Admitting: Radiation Oncology

## 2023-11-06 VITALS — BP 164/80 | HR 72 | Temp 98.7°F | Resp 18 | Wt 127.8 lb

## 2023-11-06 DIAGNOSIS — Z510 Encounter for antineoplastic radiation therapy: Secondary | ICD-10-CM | POA: Diagnosis not present

## 2023-11-06 DIAGNOSIS — Z5111 Encounter for antineoplastic chemotherapy: Secondary | ICD-10-CM | POA: Diagnosis not present

## 2023-11-06 DIAGNOSIS — C342 Malignant neoplasm of middle lobe, bronchus or lung: Secondary | ICD-10-CM | POA: Diagnosis not present

## 2023-11-06 LAB — RAD ONC ARIA SESSION SUMMARY
Course Elapsed Days: 9
Plan Fractions Treated to Date: 7
Plan Prescribed Dose Per Fraction: 2 Gy
Plan Total Fractions Prescribed: 30
Plan Total Prescribed Dose: 60 Gy
Reference Point Dosage Given to Date: 14 Gy
Reference Point Session Dosage Given: 2 Gy
Session Number: 7

## 2023-11-06 MED ORDER — DEXAMETHASONE SODIUM PHOSPHATE 10 MG/ML IJ SOLN
10.0000 mg | Freq: Once | INTRAMUSCULAR | Status: AC
  Administered 2023-11-06: 10 mg via INTRAVENOUS
  Filled 2023-11-06: qty 1

## 2023-11-06 MED ORDER — HEPARIN SOD (PORK) LOCK FLUSH 100 UNIT/ML IV SOLN: 500.0000 [IU] | Freq: Once | INTRAVENOUS | Status: DC | PRN

## 2023-11-06 MED ORDER — SODIUM CHLORIDE 0.9 % IV SOLN
45.0000 mg/m2 | Freq: Once | INTRAVENOUS | Status: AC
  Administered 2023-11-06: 72 mg via INTRAVENOUS
  Filled 2023-11-06: qty 12

## 2023-11-06 MED ORDER — SODIUM CHLORIDE 0.9 % IV SOLN
147.6000 mg | Freq: Once | INTRAVENOUS | Status: AC
  Administered 2023-11-06: 150 mg via INTRAVENOUS
  Filled 2023-11-06: qty 15

## 2023-11-06 MED ORDER — PALONOSETRON HCL INJECTION 0.25 MG/5ML
0.2500 mg | Freq: Once | INTRAVENOUS | Status: AC
  Administered 2023-11-06: 0.25 mg via INTRAVENOUS
  Filled 2023-11-06: qty 5

## 2023-11-06 MED ORDER — FAMOTIDINE IN NACL 20-0.9 MG/50ML-% IV SOLN
20.0000 mg | Freq: Once | INTRAVENOUS | Status: AC
  Administered 2023-11-06: 20 mg via INTRAVENOUS
  Filled 2023-11-06: qty 50

## 2023-11-06 MED ORDER — SODIUM CHLORIDE 0.9 % IV SOLN: INTRAVENOUS | Status: DC

## 2023-11-06 MED ORDER — DIPHENHYDRAMINE HCL 50 MG/ML IJ SOLN
50.0000 mg | Freq: Once | INTRAMUSCULAR | Status: AC
  Administered 2023-11-06: 50 mg via INTRAVENOUS
  Filled 2023-11-06: qty 1

## 2023-11-06 MED ORDER — SODIUM CHLORIDE 0.9% FLUSH: 10.0000 mL | INTRAVENOUS | Status: DC | PRN

## 2023-11-06 NOTE — Patient Instructions (Signed)
 [facility] - A DEPT OF [facility]  Discharge Instructions: Thank you for choosing [facility] to provide your oncology and hematology care.   If you have a lab appointment with the Cancer Center, please go directly to the Cancer Center and check in at the registration area.   Wear comfortable clothing and clothing appropriate for easy access to any Portacath or PICC line.   We strive to give you quality time with your provider. You may need to reschedule your appointment if you arrive late (15 or more minutes).  Arriving late affects you and other patients whose appointments are after yours.  Also, if you miss three or more appointments without notifying the office, you may be dismissed from the clinic at the provider's discretion.      For prescription refill requests, have your pharmacy contact our office and allow 72 hours for refills to be completed.    Today you received the following chemotherapy and/or immunotherapy agents: Paclitaxel , Carboplatin .       To help prevent nausea and vomiting after your treatment, we encourage you to take your nausea medication as directed.  BELOW ARE SYMPTOMS THAT SHOULD BE REPORTED IMMEDIATELY: *FEVER GREATER THAN 100.4 F (38 C) OR HIGHER *CHILLS OR SWEATING *NAUSEA AND VOMITING THAT IS NOT CONTROLLED WITH YOUR NAUSEA MEDICATION *UNUSUAL SHORTNESS OF BREATH *UNUSUAL BRUISING OR BLEEDING *URINARY PROBLEMS (pain or burning when urinating, or frequent urination) *BOWEL PROBLEMS (unusual diarrhea, constipation, pain near the anus) TENDERNESS IN MOUTH AND THROAT WITH OR WITHOUT PRESENCE OF ULCERS (sore throat, sores in mouth, or a toothache) UNUSUAL RASH, SWELLING OR PAIN  UNUSUAL VAGINAL DISCHARGE OR ITCHING   Items with * indicate a potential emergency and should be followed up as soon as possible or go to the Emergency Department if any problems should occur.  Please show the CHEMOTHERAPY ALERT CARD  or IMMUNOTHERAPY ALERT CARD at check-in to the Emergency Department and triage nurse.  Should you have questions after your visit or need to cancel or reschedule your appointment, please contact [facility]  Dept: [phone]  and follow the prompts.  Office hours are 8:00 a.m. to 4:30 p.m. Monday - Friday. Please note that voicemails left after 4:00 p.m. may not be returned until the following business day.  We are closed weekends and major holidays. You have access to a nurse at all times for urgent questions. Please call the main number to the clinic Dept: [phone] and follow the prompts.   For any non-urgent questions, you may also contact your provider using MyChart. We now offer e-Visits for anyone 64 and older to request care online for non-urgent symptoms. For details visit [email or web address].   Also download the MyChart app! Go to the app store, search MyChart, open the app, select [employer], and log in with your MyChart username and password.

## 2023-11-06 NOTE — Consult Note (Signed)
 Chief Complaint: Patient was seen in consultation today for port a cath placement to assist with treatment of lung cancer  Referring Physician(s): Mohamed,M  Supervising Physician: Karalee Beat  Patient Status: [facility] - Out-pt  History of Present Illness: Ashley Pratt is a 73 y.o. female with PMH sig for aortic stenosis, arthritis, GERD, HTN, OA, colon polyps, prediabetes, right breast cancer 2014 and stage IIIb (T1c, N3, M0) non-small cell lung cancer, adenocarcinoma who presented with right middle lobe lung nodule in addition to bilateral hilar and mediastinal lymphadenopathy diagnosed in December 2024. She presents today for port a cath placement to assist with treatment.   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   BREAST LUMPECTOMY WITH NEEDLE LOCALIZATION Right 05/11/2013   Procedure: RIGHT BREAST NEEDLE LOCALIZATION  LUMPECTOMY;  Surgeon: Jina Nephew, MD;  Location: [facility];  Service: General;  Laterality: Right;   BREAST LUMPECTOMY WITH RADIOACTIVE SEED LOCALIZATION Right 08/02/2020   Procedure: RIGHT BREAST LUMPECTOMY WITH RADIOACTIVE SEED LOCALIZATION;  Surgeon: Nephew Jina, MD;  Location:  SURGERY CENTER;  Service: General;  Laterality: Right;  RNFA   BRONCHIAL BIOPSY  10/07/2023   Procedure: BRONCHIAL BIOPSIES;  Surgeon: Shelah Lamar RAMAN, MD;  Location: [facility];  Service: Pulmonary;;   BRONCHIAL BRUSHINGS  10/07/2023   Procedure:  BRONCHIAL BRUSHINGS;  Surgeon: Shelah Lamar RAMAN, MD;  Location: [facility];  Service: Pulmonary;;   BRONCHIAL NEEDLE ASPIRATION BIOPSY  10/07/2023   Procedure: BRONCHIAL NEEDLE ASPIRATION BIOPSIES;  Surgeon: Shelah Lamar RAMAN, MD;  Location: [facility];  Service: Pulmonary;;   CHOLECYSTECTOMY     COLONOSCOPY     FOOT OSTEOTOMY     both  feet   TONSILLECTOMY     VIDEO BRONCHOSCOPY WITH ENDOBRONCHIAL ULTRASOUND N/A 10/07/2023   Procedure: VIDEO BRONCHOSCOPY WITH ENDOBRONCHIAL ULTRASOUND;  Surgeon: Shelah Lamar RAMAN, MD;  Location: [facility];  Service: Pulmonary;  Laterality: N/A;    Allergies: Aspirin, Gabapentin, Losartan  potassium-hctz, and Penicillins  Medications: Prior to Admission medications   Medication Sig Start Date End Date Taking? Authorizing Provider  lidocaine -prilocaine  (EMLA ) cream Apply 1 Application topically as needed. 11/01/23   Heilingoetter, Cassandra L, PA-C  losartan  (COZAAR ) 100 MG tablet Take 100 mg by mouth daily. 06/13/23   [provider]  magnesium gluconate (MAGONATE) 500 MG tablet Take 500 mg by mouth 2 (two) times daily.    [provider]  Multiple Vitamin (MULTIVITAMIN) capsule Take 1 capsule by mouth daily.    [provider]  ondansetron  (ZOFRAN ) 8 MG tablet Take 1 tablet (8 mg total) by mouth every 8 (eight) hours as needed for nausea or vomiting. Start on the third day after chemotherapy. 10/25/23   Sherrod Sherrod, MD  pantoprazole (PROTONIX) 20 MG tablet Take 20 mg by mouth daily. 05/17/22   [provider]  prochlorperazine  (COMPAZINE ) 10 MG tablet Take 1 tablet (10  mg total) by mouth every 6 (six) hours as needed for nausea or vomiting. 10/25/23   Sherrod Sherrod, MD     Family History  Problem Relation Age of Onset   Hypertension Mother    Stroke Mother    Hypertension Sister    Hypertension Brother     Social History   Socioeconomic History   Marital status: Married    Spouse name: Not on file    Number of children: 2   Years of education: Not on file   Highest education level: Not on file  Occupational History   Not on file  Tobacco Use   Smoking status: Never   Smokeless tobacco: Never  Substance and Sexual Activity   Alcohol use: No   Drug use: No   Sexual activity: Yes  Other Topics Concern   Not on file  Social History Narrative   Lives with husband.     Social Drivers of Corporate Investment Banker Strain: Not on file  Food Insecurity: Low Risk  (09/23/2023)   Received from [facility]   Hunger Vital Sign    Worried About Running Out of Food in the Last Year: Never true    Ran Out of Food in the Last Year: Never true  Transportation Needs: No Transportation Needs (09/23/2023)   Received from [facility]    In the past 12 months, has lack of reliable transportation kept you from medical appointments, meetings, work or from getting things needed for daily living? : No  Physical Activity: Not on file  Stress: Not on file  Social Connections: Not on file      Review of Systems denies fever,HA,CP,dyspnea, cough, abd pain, back pain,N/V or bleeding  Vital Signs:pending    Code Status: FULL CODE  Advance Care Plan: no documents on file    Physical Exam: awake/alert; chest- CTA bilat; heart- RRR,+murmur; abd-soft,+BS,NT; no LE edema  Imaging: CT CHEST ABDOMEN PELVIS W CONTRAST Result Date: 10/22/2023 CLINICAL DATA:  Non-small-cell lung cancer, restaging. History of breast cancer. * Tracking Code: BO * EXAM: CT CHEST, ABDOMEN, AND PELVIS WITH CONTRAST TECHNIQUE: Multidetector CT imaging of the chest, abdomen and pelvis was performed following the standard protocol during bolus administration of intravenous contrast. RADIATION DOSE REDUCTION: This exam was performed according to the departmental dose-optimization program which includes automated exposure control, adjustment of the mA and/or kV according to patient size and/or use of  iterative reconstruction technique. CONTRAST:  OMNIPAQUE  IOHEXOL  300 MG/ML  SOLN COMPARISON:  10/04/2023 chest CT. PET 08/08/2023. clinic note of 10/18/2023 reviewed. FINDINGS: CT CHEST FINDINGS Cardiovascular: Duplicated SVC. Aortic atherosclerosis. Tortuous thoracic aorta. Mild cardiomegaly, without pericardial effusion. No central pulmonary embolism, on this non-dedicated study. Mediastinum/Nodes: No supraclavicular adenopathy. Precarinal 9 mm node on 24/2 is unchanged. Low right paratracheal 8 mm node on 22/2 is also similar. No hilar adenopathy. Mild esophageal dilatation with fluid level within on 32/2. Lungs/Pleura: No pleural fluid. Posterolateral right middle lobe pulmonary nodule measures 2.3 x 2.1 cm on 75/4, similar to 2.3 x 2.0 cm on the prior. Musculoskeletal: Included within the abdomen pelvic section. CT ABDOMEN PELVIS FINDINGS Hepatobiliary: Normal liver. Cholecystectomy, without biliary ductal dilatation. Pancreas: Normal, without mass or ductal dilatation. Spleen: Normal in size, without focal abnormality. Adrenals/Urinary Tract: Normal adrenal glands. 3 mm interpolar left renal collecting system calculus. Upper pole left renal 9 mm cyst or minimally complex cyst . In the absence of clinically indicated signs/symptoms require(s) no independent follow-up. Normal  right kidney. No hydronephrosis. Normal urinary bladder. Stomach/Bowel: Proximal gastric underdistention. Colonic stool burden suggests constipation. Normal terminal ileum and appendix. Normal small bowel. Vascular/Lymphatic: Aortic atherosclerosis. No abdominopelvic adenopathy. Reproductive: Normal uterus and adnexa. Other: No significant free fluid. No evidence of omental or peritoneal disease. Musculoskeletal: Right-sided lumpectomy Trace L4-5 anterolisthesis. Mild motion degradation throughout the abdomen and upper pelvis. IMPRESSION: 1. Relatively similar appearance of right middle lobe lung nodule and small mediastinal nodes  which were hypermetabolic on PET. 2. No acute process or evidence of metastatic disease within the abdomen or pelvis. 3. Incidental findings, including: Aortic Atherosclerosis (ICD10-I70.0). Left nephrolithiasis. Duplicated SVC. Esophageal air fluid level suggests dysmotility or gastroesophageal reflux. Electronically Signed   By: Rockey Kilts M.D.   On: 10/22/2023 10:43   MR Brain W Wo Contrast Result Date: 10/12/2023 CLINICAL DATA:  Non-small cell lung cancer (NSCLC), staging EXAM: MRI HEAD WITHOUT AND WITH CONTRAST TECHNIQUE: Multiplanar, multiecho pulse sequences of the brain and surrounding structures were obtained without and with intravenous contrast. CONTRAST:  6mL GADAVIST  GADOBUTROL  1 MMOL/ML IV SOLN COMPARISON:  MRI August 03, 2021 FINDINGS: Brain: No acute infarction, hemorrhage, hydrocephalus, extra-axial collection or mass lesion. No pathologic enhancement. Vascular: Major arterial flow voids are maintained at the skull base. Skull and upper cervical spine: Normal marrow signal. Sinuses/Orbits: Negative. IMPRESSION: No evidence of acute intracranial abnormality or metastatic disease. Electronically Signed   By: Gilmore GORMAN Molt M.D.   On: 10/12/2023 16:53    Labs:  CBC: Recent Labs    10/07/23 0550 10/18/23 0945 10/29/23 1033 11/05/23 0927  WBC 5.1 7.8 7.0 4.9  HGB 12.4 12.0 12.1 11.8*  HCT 37.8 36.2 36.7 35.4*  PLT 188 209 215 169    COAGS: No results for input(s): INR, APTT in the last 8760 hours.  BMP: Recent Labs    10/07/23 0550 10/18/23 0945 10/29/23 1033 11/05/23 0927  NA 128* 133* 130* 129*  K 3.7 4.4 4.4 4.1  CL 97* 98 94* 94*  CO2 22 30 30 30   GLUCOSE 98 145* 122* 118*  BUN <5* 11 10 11   CALCIUM 8.4* 9.2 9.5 9.0  CREATININE 0.75 0.79 0.76 0.69  GFRNONAA >60 >60 >60 >60    LIVER FUNCTION TESTS: Recent Labs    07/11/23 1024 10/18/23 0945 10/29/23 1033 11/05/23 0927  BILITOT 0.4 0.5 0.5 0.6  AST 21 24 26 20   ALT 20 22 28 22   ALKPHOS 36*  37* 36* 32*  PROT 6.7 6.3* 6.8 6.5  ALBUMIN 4.0 4.1 4.2 4.0    TUMOR MARKERS: No results for input(s): AFPTM, CEA, CA199, CHROMGRNA in the last 8760 hours.  Assessment and Plan: 73 y.o. female with PMH sig for aortic stenosis, arthritis, GERD, HTN, OA, colon polyps, prediabetes, right breast cancer 2014 and stage IIIb (T1c, N3, M0) non-small cell lung cancer, adenocarcinoma who presented with right middle lobe lung nodule in addition to bilateral hilar and mediastinal lymphadenopathy diagnosed in December 2024. She presents today for port a cath placement to assist with treatment. Risks and benefits of image guided port-a-catheter placement was discussed with the patient/spouse including, but not limited to bleeding, infection, pneumothorax, or fibrin sheath development and need for additional procedures.  All of the patient's questions were answered, patient is agreeable to proceed. Consent signed and in chart.  PT HAS STATED THAT SHE IS OK WITH INFUSIONS THROUGH PORT BUT DOES NOT WANT BLOOD DRAWN FROM PORT; BLOOD TO BE DRAWN FROM ARM VEIN  Thank you for this interesting consult.  I greatly enjoyed meeting Karsynn Deweese and look forward to participating in their care.  A copy of this report was sent to the requesting provider on this date.  Electronically Signed: D. Franky Rakers, PA-C 11/06/2023, 10:38 AM   I spent a total of 25 minutes    in face to face in clinical consultation, greater than 50% of which was counseling/coordinating care for port a cath placement

## 2023-11-06 NOTE — Progress Notes (Signed)
 Nutrition Assessment   Reason for Assessment: Referral   ASSESSMENT: 73 year old female with malignant neoplasm of middle lobe of right lung. She is receiving concurrent chemoradiation with weekly carbo/taxol . Patient is under the care of Dr. Dewey and Dr. Sherrod.   Past medical history includes HTN, nonrheumatic aortic valve stenosis, dysphagia, GERD, osteopenia, right LCIS, arthritis  Met with patient and husband in infusion. Patient reports small appetite. She is eating twice daily. Recalls homemade bread with hard boiled egg for breakfast. Patient has bread with fish or chicken for dinner. Patient will snack on fruit. She was drinking Ensure, but stopped secondary to high sugar content.   Nutrition Focused Physical Exam: deferred    Medications: cozaar , zofran , protonix, compazine    Labs: Na 129, glucose 118   Anthropometrics:   Height: 5' Weight: 127 lb 12.8 oz  UBW: 135 lb (10/10/23) BMI: 24.96   NUTRITION DIAGNOSIS: Unintentional weight loss related to lung cancer and associated treatment side effects as evidenced by decreased appetite, 6% wt loss in 3 weeks which is severe for time frame    INTERVENTION:  Educated on smaller more frequent meals and choosing soft moist foods for ease of intake - handout with ideas provided  Recommend daily Ensure Complete for added calories and protein - samples + coupons provided  Contact information    MONITORING, EVALUATION, GOAL: Patient will tolerate increased calories and protein to minimize further wt loss during treatment    Next Visit: Monday January 27 during infusion with Joli

## 2023-11-07 ENCOUNTER — Other Ambulatory Visit: Payer: Self-pay

## 2023-11-07 ENCOUNTER — Ambulatory Visit (HOSPITAL_COMMUNITY)
Admission: RE | Admit: 2023-11-07 | Discharge: 2023-11-07 | Disposition: A | Discharge status: Home / Self Care | Payer: Medicare HMO | Source: Ambulatory Visit | Attending: Physician Assistant | Admitting: Physician Assistant

## 2023-11-07 ENCOUNTER — Encounter (HOSPITAL_COMMUNITY): Payer: Self-pay

## 2023-11-07 ENCOUNTER — Ambulatory Visit
Admission: RE | Admit: 2023-11-07 | Discharge: 2023-11-07 | Disposition: A | Discharge status: Home / Self Care | Payer: Medicare HMO | Source: Ambulatory Visit | Attending: Radiation Oncology | Admitting: Radiation Oncology

## 2023-11-07 DIAGNOSIS — I350 Nonrheumatic aortic (valve) stenosis: Secondary | ICD-10-CM | POA: Insufficient documentation

## 2023-11-07 DIAGNOSIS — C342 Malignant neoplasm of middle lobe, bronchus or lung: Secondary | ICD-10-CM | POA: Diagnosis not present

## 2023-11-07 DIAGNOSIS — K219 Gastro-esophageal reflux disease without esophagitis: Secondary | ICD-10-CM | POA: Insufficient documentation

## 2023-11-07 DIAGNOSIS — R7303 Prediabetes: Secondary | ICD-10-CM | POA: Insufficient documentation

## 2023-11-07 DIAGNOSIS — I10 Essential (primary) hypertension: Secondary | ICD-10-CM | POA: Diagnosis not present

## 2023-11-07 DIAGNOSIS — Z510 Encounter for antineoplastic radiation therapy: Secondary | ICD-10-CM | POA: Diagnosis not present

## 2023-11-07 DIAGNOSIS — Z853 Personal history of malignant neoplasm of breast: Secondary | ICD-10-CM | POA: Insufficient documentation

## 2023-11-07 DIAGNOSIS — Z452 Encounter for adjustment and management of vascular access device: Secondary | ICD-10-CM | POA: Diagnosis not present

## 2023-11-07 HISTORY — PX: IR IMAGING GUIDED PORT INSERTION: IMG5740

## 2023-11-07 LAB — RAD ONC ARIA SESSION SUMMARY
Course Elapsed Days: 10
Plan Fractions Treated to Date: 8
Plan Prescribed Dose Per Fraction: 2 Gy
Plan Total Fractions Prescribed: 30
Plan Total Prescribed Dose: 60 Gy
Reference Point Dosage Given to Date: 16 Gy
Reference Point Session Dosage Given: 2 Gy
Session Number: 8

## 2023-11-07 MED ORDER — LIDOCAINE-EPINEPHRINE 1 %-1:100000 IJ SOLN
INTRAMUSCULAR | Status: AC
  Filled 2023-11-07: qty 1

## 2023-11-07 MED ORDER — LIDOCAINE-EPINEPHRINE 1 %-1:100000 IJ SOLN
20.0000 mL | Freq: Once | INTRAMUSCULAR | Status: AC
Start: 2023-11-07 — End: 2023-11-07
  Administered 2023-11-07: 14 mL via INTRADERMAL

## 2023-11-07 MED ORDER — FENTANYL CITRATE (PF) 100 MCG/2ML IJ SOLN
INTRAMUSCULAR | Status: AC | PRN
  Administered 2023-11-07: 50 ug via INTRAVENOUS

## 2023-11-07 MED ORDER — HEPARIN SOD (PORK) LOCK FLUSH 100 UNIT/ML IV SOLN
INTRAVENOUS | Status: AC
  Filled 2023-11-07: qty 5

## 2023-11-07 MED ORDER — FENTANYL CITRATE (PF) 100 MCG/2ML IJ SOLN
INTRAMUSCULAR | Status: AC
  Filled 2023-11-07: qty 2

## 2023-11-07 MED ORDER — SODIUM CHLORIDE 0.9 % IV SOLN: INTRAVENOUS | Status: DC

## 2023-11-07 MED ORDER — MIDAZOLAM HCL 2 MG/2ML IJ SOLN
INTRAMUSCULAR | Status: AC | PRN
  Administered 2023-11-07: 1 mg via INTRAVENOUS

## 2023-11-07 MED ORDER — MIDAZOLAM HCL 2 MG/2ML IJ SOLN
INTRAMUSCULAR | Status: AC
  Filled 2023-11-07: qty 2

## 2023-11-07 MED ORDER — HEPARIN SOD (PORK) LOCK FLUSH 100 UNIT/ML IV SOLN
500.0000 [IU] | Freq: Once | INTRAVENOUS | Status: AC
  Administered 2023-11-07: 500 [IU] via INTRAVENOUS

## 2023-11-07 NOTE — Discharge Instructions (Signed)
 Please call Interventional Radiology clinic [phone] with any questions or concerns.  You may remove your dressing and shower tomorrow.  After the procedure, it is common to have: Discomfort at the port insertion site. Bruising on the skin over the port. This should improve over 3-4 days  Follow these instructions at home:  Medication: Do not use Aspirin or ibuprofen products, such as Advil or Motrin, as it may increase bleeding.  You may resume your usual medications as ordered by your doctor. If your doctor prescribed antibiotics, take them as directed. Do not stop taking them just because you feel better. You need to take the full course of antibiotics.  Eating and drinking: Drink plenty of liquids to keep your urine pale yellow You can resume your regular diet as directed by your doctor   Care of the procedure site Follow instructions from your health care provider about how to take care of your port insertion site. Make sure you: After your port is placed, you will get a manufacturer's information card. The card has information about your port. Keep this card with you at all times Make sure to remember what type of port you have Take care of the port as told by your health care provider DO NOT use EMLA cream for 2 weeks after port placement -the cream will remove the surgical glue on your incision DO NOT use any lotions, creams, or ointments on incision for 2 weeks. This will remove the surgical glue on your incision Wash your hands with soap and water before and after you change your bandage (dressing). If soap and water are not available, use hand sanitizer Change your dressing as told by your health care provider Leave skin glue, or adhesive strips in place. These skin closures may need to stay in place for 2 weeks or longer Check your port insertion site every day for signs of infection. Check for: Redness, swelling, or pain Fluid or blood Warmth Pus or a bad  smell  Activity Return to your normal activities as told by your health care provider. Ask your health care provider what activities are safe for you Do not lift anything that is heavier than 10 lb (4.5 kg), or the limit that you are told, until your health care provider says that it is safe Do not take baths, swim, or use a hot tub until your health care provider approves. Take showers only. Keep all follow-up visits as told by your doctor  Contact a health care provider if: You cannot flush your port with saline as directed, or you cannot draw blood from the port You have a fever or chills You have redness, swelling, or pain around your port insertion site You have fluid or blood coming from your port insertion site Your port insertion site feels warm to the touch You have pus or a bad smell coming from the port insertion site  Get help right away if: You have chest pain or shortness of breath You have bleeding from your port that you cannot control

## 2023-11-07 NOTE — Progress Notes (Signed)
 [facility] OFFICE PROGRESS NOTE  Ashley Other, MD [address] [place] [address]  DIAGNOSIS: stage IIIb (T1c, N3, M0) non-small cell lung cancer, adenocarcinoma presented with right middle lobe lung nodule in addition to bilateral hilar and mediastinal lymphadenopathy diagnosed in December 2024.    Biomarker Findings HRD signature - HRDsig Negative Microsatellite status - MS-Stable Tumor Mutational Burden - 2 Muts/Mb Genomic Findings For a complete list of the genes assayed, please refer to the Appendix. EGFR G719C, E709V, amplification CDKN2A loss - equivocal? MTAP loss - equivocal? CDKN2B loss - equivocal? FGF23 R187W - subclonal? TP53 R248L 7 Disease relevant genes with no reportable alterations: ALK, BRAF, ERBB2, KRAS, MET, RET, ROS1   PDL1 Expression 30%  PRIOR THERAPY: None  CURRENT THERAPY: Concurrent chemoradiation with weekly carboplatin  for AUC of 2 and paclitaxel  45 Mg/M2. First dose October 29, 2023. Status post 2 cycles.   INTERVAL HISTORY: Ashley [facility] 73 y.o. female returns to the clinic today for a follow-up visit accompanied by her husband.  The patient was recently diagnosed with lung cancer.  She is currently undergoing a course of concurrent chemoradiation. She is tolerating it well overall except for constipation and some bloating/gas. She also mentions some insomnia.    In the interval since last being seen, she had a port-a-cath plated on 11/06/22. She has also been following with a member of the nutritionist team. Her husband states that she has never been a good eater, even before. We reviewed how to use the numbing cream today. She was not aware that this was at the pharmacy.   Overall, she is feeling fair today. Denies any fever, chills, night sweats, or weight loss. Denies any chest pain, shortness of breath, cough, or hemoptysis. Denies any nausea, vomiting, or diarrhea. Denies any headache or visual changes.  Denies any rashes or skin changes. The patient is here today for evaluation prior to starting cycle # 3    MEDICAL HISTORY: Past Medical History:  Diagnosis Date   Aortic stenosis    Arthritis    Breast cancer (HCC)    Chronic back pain    Colon polyps    GERD (gastroesophageal reflux disease)    Hypertension    Malignant neoplasm of right female breast (HCC)    unspecified site of breast   Osteoarthritis    of the knee left worse than right   Prediabetes     ALLERGIES:  is allergic to aspirin, gabapentin, losartan  potassium-hctz, and penicillins.  MEDICATIONS:  Current Outpatient Medications  Medication Sig Dispense Refill   lidocaine -prilocaine  (EMLA ) cream Apply 1 Application topically as needed. 30 g 2   losartan  (COZAAR ) 100 MG tablet Take 100 mg by mouth daily.     magnesium gluconate (MAGONATE) 500 MG tablet Take 500 mg by mouth 2 (two) times daily.     Multiple Vitamin (MULTIVITAMIN) capsule Take 1 capsule by mouth daily.     ondansetron  (ZOFRAN ) 8 MG tablet Take 1 tablet (8 mg total) by mouth every 8 (eight) hours as needed for nausea or vomiting. Start on the third day after chemotherapy. 30 tablet 1   pantoprazole (PROTONIX) 20 MG tablet Take 20 mg by mouth daily.     prochlorperazine  (COMPAZINE ) 10 MG tablet Take 1 tablet (10 mg total) by mouth every 6 (six) hours as needed for nausea or vomiting. 30 tablet 1   No current facility-administered medications for this visit.   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   BREAST LUMPECTOMY WITH  NEEDLE LOCALIZATION Right 05/11/2013   Procedure: RIGHT BREAST NEEDLE LOCALIZATION  LUMPECTOMY;  Surgeon: Jina Nephew, MD;  Location: [facility];  Service: General;  Laterality: Right;   BREAST LUMPECTOMY WITH RADIOACTIVE SEED LOCALIZATION Right 08/02/2020   Procedure: RIGHT BREAST LUMPECTOMY WITH RADIOACTIVE SEED LOCALIZATION;  Surgeon: Nephew Jina, MD;  Location: [facility];  Service: General;  Laterality: Right;  RNFA   BRONCHIAL BIOPSY  10/07/2023   Procedure: BRONCHIAL BIOPSIES;  Surgeon: Shelah Lamar RAMAN, MD;  Location: [facility] ENDOSCOPY;  Service: Pulmonary;;   BRONCHIAL BRUSHINGS  10/07/2023   Procedure: BRONCHIAL BRUSHINGS;  Surgeon: Shelah Lamar RAMAN, MD;  Location: [facility] ENDOSCOPY;  Service: Pulmonary;;   BRONCHIAL NEEDLE ASPIRATION BIOPSY  10/07/2023   Procedure: BRONCHIAL NEEDLE ASPIRATION BIOPSIES;  Surgeon: Shelah Lamar RAMAN, MD;  Location: [facility] ENDOSCOPY;  Service: Pulmonary;;   CHOLECYSTECTOMY     COLONOSCOPY     FOOT OSTEOTOMY     both  feet   TONSILLECTOMY     VIDEO BRONCHOSCOPY WITH ENDOBRONCHIAL ULTRASOUND N/A 10/07/2023   Procedure: VIDEO BRONCHOSCOPY WITH ENDOBRONCHIAL ULTRASOUND;  Surgeon: Shelah Lamar RAMAN, MD;  Location: [facility] ENDOSCOPY;  Service: Pulmonary;  Laterality: N/A;    REVIEW OF SYSTEMS:   Review of Systems  Constitutional: Negative for appetite change, chills, fatigue, fever and unexpected weight change.  HENT: Negative for mouth sores, nosebleeds, sore throat and trouble swallowing.   Eyes: Negative for eye problems and icterus.  Respiratory: Negative for cough, hemoptysis, shortness of breath and wheezing.   Cardiovascular: Negative for chest pain and leg swelling.  Gastrointestinal: Positive for constipation. Negative for abdominal pain,  diarrhea, nausea and vomiting.  Genitourinary: Negative for bladder incontinence, difficulty urinating, dysuria, frequency and hematuria.   Musculoskeletal: Negative for back pain, gait problem, neck pain  and neck stiffness.  Skin: Negative for itching and rash.  Neurological: Negative for dizziness, extremity weakness, gait problem, headaches, light-headedness and seizures.  Hematological: Negative for adenopathy. Does not bruise/bleed easily.  Psychiatric/Behavioral: Positive for insomnia. Negative for confusion, depression and sleep disturbance. The patient is not nervous/anxious.     PHYSICAL EXAMINATION:  There were no vitals taken for this visit.  ECOG PERFORMANCE STATUS: 1  Physical Exam  Constitutional: Oriented to person, place, and time and well-developed, well-nourished, and in no distress.  HENT:  Head: Normocephalic and atraumatic.  Mouth/Throat: Oropharynx is clear and moist. No oropharyngeal exudate.  Eyes: Conjunctivae are normal. Right eye exhibits no discharge. Left eye exhibits no discharge. No scleral icterus.  Neck: Normal range of motion. Neck supple.  Cardiovascular: Normal rate, regular rhythm, normal heart sounds and intact distal pulses.   Pulmonary/Chest: Effort normal and breath sounds normal. No respiratory distress. No wheezes. No rales.  Abdominal: Soft. Bowel sounds are normal. Exhibits no distension and no mass. There is no tenderness.  Musculoskeletal: Normal range of motion. Exhibits no edema.  Lymphadenopathy:    No cervical adenopathy.  Neurological: Alert and oriented to person, place, and time. Exhibits normal muscle tone. Gait normal. Coordination normal.  Skin: Skin is warm and dry. No rash noted. Not diaphoretic. No erythema. No pallor.  Psychiatric: Mood, memory and judgment normal.  Vitals reviewed.  LABORATORY DATA: Lab Results  Component Value Date   WBC 4.9 11/05/2023   HGB 11.8 (L) 11/05/2023   HCT 35.4 (L) 11/05/2023   MCV 76.3 (L) 11/05/2023   PLT 169 11/05/2023      Chemistry      Component Value Date/Time   NA 129 (L) 11/05/2023 0927   NA 139 10/17/2015 1322   K 4.1 11/05/2023 0927   K 4.1 10/17/2015 1322   CL 94 (L)  11/05/2023 0927   CO2 30 11/05/2023 0927   CO2 28 10/17/2015 1322   BUN 11 11/05/2023 0927   BUN 14.5 10/17/2015 1322   CREATININE 0.69 11/05/2023 0927   CREATININE 0.87 04/10/2016 1540   CREATININE 0.9 10/17/2015 1322      Component Value Date/Time   CALCIUM 9.0 11/05/2023 0927   CALCIUM 10.1 10/17/2015 1322   ALKPHOS 32 (L) 11/05/2023 0927   ALKPHOS 103 10/17/2015 1322   AST 20 11/05/2023 0927   AST 24 10/17/2015 1322   ALT 22 11/05/2023 0927   ALT 32 10/17/2015 1322   BILITOT 0.6 11/05/2023 0927   BILITOT 0.33 10/17/2015 1322       RADIOGRAPHIC STUDIES:  CT CHEST ABDOMEN PELVIS W CONTRAST Result Date: 10/22/2023 CLINICAL DATA:  Non-small-cell lung cancer, restaging. History of breast cancer. * Tracking Code: BO * EXAM: CT CHEST, ABDOMEN, AND PELVIS WITH CONTRAST TECHNIQUE: Multidetector CT imaging of the chest, abdomen and pelvis was performed following the standard protocol during bolus administration of intravenous contrast. RADIATION DOSE REDUCTION: This exam was performed according to the departmental dose-optimization program which includes automated exposure control, adjustment of the mA and/or kV according to patient size and/or use of iterative reconstruction technique. CONTRAST:  OMNIPAQUE  IOHEXOL  300 MG/ML  SOLN COMPARISON:  10/04/2023 chest CT. PET 08/08/2023. clinic note of 10/18/2023 reviewed. FINDINGS: CT CHEST FINDINGS Cardiovascular: Duplicated SVC. Aortic atherosclerosis. Tortuous thoracic aorta. Mild cardiomegaly, without pericardial effusion. No central pulmonary embolism, on this non-dedicated study. Mediastinum/Nodes: No supraclavicular adenopathy. Precarinal 9 mm node on 24/2 is unchanged. Low right paratracheal 8 mm node on 22/2 is also similar. No hilar adenopathy. Mild esophageal dilatation with fluid level within on 32/2. Lungs/Pleura: No pleural fluid. Posterolateral right middle lobe pulmonary nodule measures 2.3 x 2.1 cm on 75/4, similar to 2.3 x 2.0  cm on the prior. Musculoskeletal: Included within the abdomen pelvic section. CT ABDOMEN PELVIS FINDINGS Hepatobiliary: Normal liver. Cholecystectomy, without biliary ductal dilatation. Pancreas: Normal, without mass or ductal dilatation. Spleen: Normal in size, without focal abnormality. Adrenals/Urinary Tract: Normal adrenal glands. 3 mm interpolar left renal collecting system calculus. Upper pole left renal 9 mm cyst or minimally complex cyst . In the absence of clinically indicated signs/symptoms require(s) no independent follow-up. Normal right kidney. No hydronephrosis. Normal urinary bladder. Stomach/Bowel: Proximal gastric underdistention. Colonic stool burden suggests constipation. Normal terminal ileum and appendix. Normal small bowel. Vascular/Lymphatic: Aortic atherosclerosis. No abdominopelvic adenopathy. Reproductive: Normal uterus and adnexa. [facility]: No significant free fluid. No evidence of omental or peritoneal disease. Musculoskeletal: Right-sided lumpectomy Trace L4-5 anterolisthesis. Mild motion degradation throughout the abdomen and upper pelvis. IMPRESSION: 1. Relatively similar appearance of right middle lobe lung nodule and small mediastinal nodes which were hypermetabolic on PET. 2. No acute process or evidence of metastatic disease within the abdomen or pelvis. 3. Incidental findings, including: Aortic Atherosclerosis (ICD10-I70.0). Left  nephrolithiasis. Duplicated SVC. Esophageal air fluid level suggests dysmotility or gastroesophageal reflux. Electronically Signed   By: Rockey Kilts M.D.   On: 10/22/2023 10:43   MR Brain W Wo Contrast Result Date: 10/12/2023 CLINICAL DATA:  Non-small cell lung cancer (NSCLC), staging EXAM: MRI HEAD WITHOUT AND WITH CONTRAST TECHNIQUE: Multiplanar, multiecho pulse sequences of the brain and surrounding structures were obtained without and with intravenous contrast. CONTRAST:  6mL GADAVIST  GADOBUTROL  1 MMOL/ML IV SOLN COMPARISON:  MRI August 03, 2021  FINDINGS: Brain: No acute infarction, hemorrhage, hydrocephalus, extra-axial collection or mass lesion. No pathologic enhancement. Vascular: Major arterial flow voids are maintained at the skull base. Skull and upper cervical spine: Normal marrow signal. Sinuses/Orbits: Negative. IMPRESSION: No evidence of acute intracranial abnormality or metastatic disease. Electronically Signed   By: Gilmore GORMAN Molt M.D.   On: 10/12/2023 16:53     ASSESSMENT/PLAN:  This is a very pleasant 73 year old female followed by the clinic for stage 3B non-small cell lung cancer (adenocarcinoma) in December 2024, presents for the initiation of her first treatment with chemo and radiation. The patient's molecular markers were tested, revealing an uncommon EGFR mutation and a PD-L1 exhalation of 30%, both of which could be utilized for future treatment.   She is currently undergoing chemotherapy and immunotherapy with carboplatin  for an AUC of 2 and taxol  45 mg/m2 weekly with concurrent radiation. She is status post 2 cycles and has tolerated it well.  Labs were reviewed. Recommend she proceed with cycle #3 tomorrow as scheduled.   We will see her back for labs and follow up visit in 2 weeks for evaluation before undergoing cycle #5.   She will follow with a member of the nutritionist team.  I encouraged her to drink boost or Ensure.  We also discussed the importance of good nutrition with her current treatment.  I did revisit her molecular studies and discussed that this does not change her current treatment but will help guide Dr. Sherrod regarding her neck steps following her chemoradiation.  We also discussed constipation education.  She was advised to take a stool softener daily to prevent constipation.  When she is acutely constipated, encouraged her to take MiraLAX.  We also discussed drinking plenty water and eating fruits and vegetables to reduce the risk of constipation.  I let the patient know is okay to take  Tums or Gas-X.  She was advised to take melatonin for her insomnia.  The patient does not like taking Benadryl .  The patient was advised to call immediately if she has any concerning symptoms in the interval. The patient voices understanding of current disease status and treatment options and is in agreement with the current care plan. All questions were answered. The patient knows to call the clinic with any problems, questions or concerns. We can certainly see the patient much sooner if necessary      No orders of the defined types were placed in this encounter.    The total time spent in the appointment was 20-29 minutes  Ivette Castronova L Dietrich Samuelson, PA-C 11/07/23

## 2023-11-07 NOTE — Procedures (Signed)
 Interventional Radiology Procedure Note  Procedure: Placement of a right IJ approach single lumen PowerPort (Bard ClearVue).  Tip is positioned at the superior cavoatrial junction and catheter is ready for immediate use.   Complications: No immediate  Recommendations:  - Ok to shower tomorrow - Do not submerge for 7 days - Routine line care   Signed,  Roxie Cord, MD

## 2023-11-08 ENCOUNTER — Telehealth: Payer: Self-pay

## 2023-11-08 ENCOUNTER — Ambulatory Visit
Admission: RE | Admit: 2023-11-08 | Discharge: 2023-11-08 | Disposition: A | Discharge status: Home / Self Care | Payer: Medicare HMO | Source: Ambulatory Visit | Attending: Radiation Oncology

## 2023-11-08 ENCOUNTER — Other Ambulatory Visit: Payer: Self-pay

## 2023-11-08 DIAGNOSIS — C342 Malignant neoplasm of middle lobe, bronchus or lung: Secondary | ICD-10-CM | POA: Diagnosis not present

## 2023-11-08 DIAGNOSIS — Z510 Encounter for antineoplastic radiation therapy: Secondary | ICD-10-CM | POA: Diagnosis not present

## 2023-11-08 LAB — RAD ONC ARIA SESSION SUMMARY
Course Elapsed Days: 11
Plan Fractions Treated to Date: 9
Plan Prescribed Dose Per Fraction: 2 Gy
Plan Total Fractions Prescribed: 30
Plan Total Prescribed Dose: 60 Gy
Reference Point Dosage Given to Date: 18 Gy
Reference Point Session Dosage Given: 2 Gy
Session Number: 9

## 2023-11-08 NOTE — Telephone Encounter (Signed)
 Patients husband called and stated that she is constipated. Informed husband that she can take stool softners daily and Miralax as needed.  Patients husband verbalized understanding.

## 2023-11-11 ENCOUNTER — Other Ambulatory Visit: Payer: Self-pay

## 2023-11-11 ENCOUNTER — Inpatient Hospital Stay (HOSPITAL_BASED_OUTPATIENT_CLINIC_OR_DEPARTMENT_OTHER): Payer: Medicare HMO | Admitting: Physician Assistant

## 2023-11-11 ENCOUNTER — Inpatient Hospital Stay: Payer: Medicare HMO

## 2023-11-11 ENCOUNTER — Ambulatory Visit
Admission: RE | Admit: 2023-11-11 | Discharge: 2023-11-11 | Disposition: A | Discharge status: Home / Self Care | Payer: Medicare HMO | Source: Ambulatory Visit | Attending: Radiation Oncology

## 2023-11-11 VITALS — BP 148/75 | HR 83 | Temp 98.0°F | Resp 16 | Wt 129.9 lb

## 2023-11-11 DIAGNOSIS — Z5111 Encounter for antineoplastic chemotherapy: Secondary | ICD-10-CM | POA: Diagnosis not present

## 2023-11-11 DIAGNOSIS — C342 Malignant neoplasm of middle lobe, bronchus or lung: Secondary | ICD-10-CM

## 2023-11-11 DIAGNOSIS — Z95828 Presence of other vascular implants and grafts: Secondary | ICD-10-CM | POA: Insufficient documentation

## 2023-11-11 DIAGNOSIS — Z510 Encounter for antineoplastic radiation therapy: Secondary | ICD-10-CM | POA: Diagnosis not present

## 2023-11-11 LAB — CBC WITH DIFFERENTIAL (CANCER CENTER ONLY)
Abs Immature Granulocytes: 0.01 10*3/uL (ref 0.00–0.07)
Basophils Absolute: 0 10*3/uL (ref 0.0–0.1)
Basophils Relative: 0 %
Eosinophils Absolute: 0 10*3/uL (ref 0.0–0.5)
Eosinophils Relative: 1 %
HCT: 33.1 % — ABNORMAL LOW (ref 36.0–46.0)
Hemoglobin: 11.2 g/dL — ABNORMAL LOW (ref 12.0–15.0)
Immature Granulocytes: 0 %
Lymphocytes Relative: 30 %
Lymphs Abs: 1 10*3/uL (ref 0.7–4.0)
MCH: 25.8 pg — ABNORMAL LOW (ref 26.0–34.0)
MCHC: 33.8 g/dL (ref 30.0–36.0)
MCV: 76.3 fL — ABNORMAL LOW (ref 80.0–100.0)
Monocytes Absolute: 0.1 10*3/uL (ref 0.1–1.0)
Monocytes Relative: 4 %
Neutro Abs: 2.1 10*3/uL (ref 1.7–7.7)
Neutrophils Relative %: 65 %
Platelet Count: 137 10*3/uL — ABNORMAL LOW (ref 150–400)
RBC: 4.34 MIL/uL (ref 3.87–5.11)
RDW: 14.4 % (ref 11.5–15.5)
WBC Count: 3.3 10*3/uL — ABNORMAL LOW (ref 4.0–10.5)
nRBC: 0 % (ref 0.0–0.2)

## 2023-11-11 LAB — CMP (CANCER CENTER ONLY)
ALT: 22 U/L (ref 0–44)
AST: 21 U/L (ref 15–41)
Albumin: 3.9 g/dL (ref 3.5–5.0)
Alkaline Phosphatase: 36 U/L — ABNORMAL LOW (ref 38–126)
Anion gap: 6 (ref 5–15)
BUN: 13 mg/dL (ref 8–23)
CO2: 29 mmol/L (ref 22–32)
Calcium: 9 mg/dL (ref 8.9–10.3)
Chloride: 94 mmol/L — ABNORMAL LOW (ref 98–111)
Creatinine: 0.67 mg/dL (ref 0.44–1.00)
GFR, Estimated: >60 mL/min (ref >60)
Glucose, Bld: 121 mg/dL — ABNORMAL HIGH (ref 70–99)
Potassium: 3.7 mmol/L (ref 3.5–5.1)
Sodium: 129 mmol/L — ABNORMAL LOW (ref 135–145)
Total Bilirubin: 0.6 mg/dL (ref 0.0–1.2)
Total Protein: 6.3 g/dL — ABNORMAL LOW (ref 6.5–8.1)

## 2023-11-11 LAB — RAD ONC ARIA SESSION SUMMARY
Course Elapsed Days: 14
Plan Fractions Treated to Date: 10
Plan Prescribed Dose Per Fraction: 2 Gy
Plan Total Fractions Prescribed: 30
Plan Total Prescribed Dose: 60 Gy
Reference Point Dosage Given to Date: 20 Gy
Reference Point Session Dosage Given: 2 Gy
Session Number: 10

## 2023-11-11 MED ORDER — SODIUM CHLORIDE 0.9% FLUSH
10.0000 mL | Freq: Once | INTRAVENOUS | Status: AC
  Administered 2023-11-11: 10 mL

## 2023-11-11 MED ORDER — HEPARIN SOD (PORK) LOCK FLUSH 100 UNIT/ML IV SOLN
500.0000 [IU] | Freq: Once | INTRAVENOUS | Status: AC
  Administered 2023-11-11: 500 [IU]

## 2023-11-12 ENCOUNTER — Encounter: Payer: Self-pay | Admitting: Nurse Practitioner

## 2023-11-12 ENCOUNTER — Other Ambulatory Visit: Payer: Self-pay

## 2023-11-12 ENCOUNTER — Ambulatory Visit
Admission: RE | Admit: 2023-11-12 | Discharge: 2023-11-12 | Disposition: A | Discharge status: Home / Self Care | Payer: Medicare HMO | Source: Ambulatory Visit | Attending: Radiation Oncology | Admitting: Radiation Oncology

## 2023-11-12 ENCOUNTER — Inpatient Hospital Stay: Payer: Medicare HMO

## 2023-11-12 VITALS — BP 120/64 | HR 80 | Temp 98.9°F | Resp 16

## 2023-11-12 DIAGNOSIS — C342 Malignant neoplasm of middle lobe, bronchus or lung: Secondary | ICD-10-CM | POA: Diagnosis not present

## 2023-11-12 DIAGNOSIS — Z510 Encounter for antineoplastic radiation therapy: Secondary | ICD-10-CM | POA: Diagnosis not present

## 2023-11-12 DIAGNOSIS — Z5111 Encounter for antineoplastic chemotherapy: Secondary | ICD-10-CM | POA: Diagnosis not present

## 2023-11-12 LAB — RAD ONC ARIA SESSION SUMMARY
Course Elapsed Days: 15
Plan Fractions Treated to Date: 11
Plan Prescribed Dose Per Fraction: 2 Gy
Plan Total Fractions Prescribed: 30
Plan Total Prescribed Dose: 60 Gy
Reference Point Dosage Given to Date: 22 Gy
Reference Point Session Dosage Given: 2 Gy
Session Number: 11

## 2023-11-12 MED ORDER — PALONOSETRON HCL INJECTION 0.25 MG/5ML
0.2500 mg | Freq: Once | INTRAVENOUS | Status: AC
  Administered 2023-11-12: 0.25 mg via INTRAVENOUS
  Filled 2023-11-12: qty 5

## 2023-11-12 MED ORDER — DIPHENHYDRAMINE HCL 50 MG/ML IJ SOLN
50.0000 mg | Freq: Once | INTRAMUSCULAR | Status: AC
Start: 2023-11-12 — End: 2023-11-12
  Administered 2023-11-12: 50 mg via INTRAVENOUS
  Filled 2023-11-12: qty 1

## 2023-11-12 MED ORDER — DEXAMETHASONE SODIUM PHOSPHATE 10 MG/ML IJ SOLN
10.0000 mg | Freq: Once | INTRAMUSCULAR | Status: AC
  Administered 2023-11-12: 10 mg via INTRAVENOUS
  Filled 2023-11-12: qty 1

## 2023-11-12 MED ORDER — SODIUM CHLORIDE 0.9 % IV SOLN
147.6000 mg | Freq: Once | INTRAVENOUS | Status: AC
  Administered 2023-11-12: 150 mg via INTRAVENOUS
  Filled 2023-11-12: qty 15

## 2023-11-12 MED ORDER — HEPARIN SOD (PORK) LOCK FLUSH 100 UNIT/ML IV SOLN
500.0000 [IU] | Freq: Once | INTRAVENOUS | Status: AC | PRN
  Administered 2023-11-12: 500 [IU]

## 2023-11-12 MED ORDER — SODIUM CHLORIDE 0.9 % IV SOLN: INTRAVENOUS | Status: DC

## 2023-11-12 MED ORDER — SODIUM CHLORIDE 0.9 % IV SOLN
45.0000 mg/m2 | Freq: Once | INTRAVENOUS | Status: AC
  Administered 2023-11-12: 72 mg via INTRAVENOUS
  Filled 2023-11-12: qty 12

## 2023-11-12 MED ORDER — SODIUM CHLORIDE 0.9% FLUSH
10.0000 mL | INTRAVENOUS | Status: DC | PRN
Start: 2023-11-12 — End: 2023-11-12
  Administered 2023-11-12: 10 mL

## 2023-11-12 MED ORDER — FAMOTIDINE IN NACL 20-0.9 MG/50ML-% IV SOLN
20.0000 mg | Freq: Once | INTRAVENOUS | Status: AC
  Administered 2023-11-12: 20 mg via INTRAVENOUS
  Filled 2023-11-12: qty 50

## 2023-11-12 NOTE — Progress Notes (Signed)
 Clarified- Taxol runs over 1 hour not 1 hour 37 minutes per Devan, Rph.

## 2023-11-12 NOTE — Patient Instructions (Signed)
 [facility] - A DEPT OF [facility]  Discharge Instructions: Thank you for choosing [facility] to provide your oncology and hematology care.   If you have a lab appointment with the Cancer Center, please go directly to the Cancer Center and check in at the registration area.   Wear comfortable clothing and clothing appropriate for easy access to any Portacath or PICC line.   We strive to give you quality time with your provider. You may need to reschedule your appointment if you arrive late (15 or more minutes).  Arriving late affects you and other patients whose appointments are after yours.  Also, if you miss three or more appointments without notifying the office, you may be dismissed from the clinic at the provider's discretion.      For prescription refill requests, have your pharmacy contact our office and allow 72 hours for refills to be completed.    Today you received the following chemotherapy and/or immunotherapy agents paclitaxol, and carboplatin ,      To help prevent nausea and vomiting after your treatment, we encourage you to take your nausea medication as directed.  BELOW ARE SYMPTOMS THAT SHOULD BE REPORTED IMMEDIATELY: *FEVER GREATER THAN 100.4 F (38 C) OR HIGHER *CHILLS OR SWEATING *NAUSEA AND VOMITING THAT IS NOT CONTROLLED WITH YOUR NAUSEA MEDICATION *UNUSUAL SHORTNESS OF BREATH *UNUSUAL BRUISING OR BLEEDING *URINARY PROBLEMS (pain or burning when urinating, or frequent urination) *BOWEL PROBLEMS (unusual diarrhea, constipation, pain near the anus) TENDERNESS IN MOUTH AND THROAT WITH OR WITHOUT PRESENCE OF ULCERS (sore throat, sores in mouth, or a toothache) UNUSUAL RASH, SWELLING OR PAIN  UNUSUAL VAGINAL DISCHARGE OR ITCHING   Items with * indicate a potential emergency and should be followed up as soon as possible or go to the Emergency Department if any problems should occur.  Please show the CHEMOTHERAPY ALERT CARD  or IMMUNOTHERAPY ALERT CARD at check-in to the Emergency Department and triage nurse.  Should you have questions after your visit or need to cancel or reschedule your appointment, please contact [facility] - [facility] DEPT [facility]  Dept: [phone]  and follow the prompts.  Office hours are 8:00 a.m. to 4:30 p.m. Monday - Friday. Please note that voicemails left after 4:00 p.m. may not be returned until the following business day.  We are closed weekends and major holidays. You have access to a nurse at all times for urgent questions. Please call the main number to the clinic Dept: [phone] and follow the prompts.   For any non-urgent questions, you may also contact your provider using MyChart. We now offer e-Visits for anyone 78 and older to request care online for non-urgent symptoms. For details visit [email or web address].   Also download the MyChart app! Go to the app store, search MyChart, open the app, select [place], and log in with your MyChart username and password.  Paclitaxel  Injection What is this medication? PACLITAXEL  (PAK li TAX el) treats some types of cancer. It works by slowing down the growth of cancer cells. This medicine may be used for other purposes; ask your health care provider or pharmacist if you have questions. COMMON BRAND NAME(S): Onxol, Taxol  What should I tell my care team before I take this medication? They need to know if you have any of these conditions: Heart disease Liver disease Low white blood cell levels An unusual or allergic reaction to paclitaxel , other  medications, foods, dyes, or preservatives If you or your partner are pregnant or trying to get pregnant Breast-feeding How should I use this medication? This medication is injected into a vein. It is given by your care team in a hospital or clinic setting. Talk to your care team about the use of this medication in children. While it may be given to  children for selected conditions, precautions do apply. Overdosage: If you think you have taken too much of this medicine contact a poison control center or emergency room at once. NOTE: This medicine is only for you. Do not share this medicine with others. What if I miss a dose? Keep appointments for follow-up doses. It is important not to miss your dose. Call your care team if you are unable to keep an appointment. What may interact with this medication? Do not take this medication with any of the following: Live virus vaccines Other medications may affect the way this medication works. Talk with your care team about all of the medications you take. They may suggest changes to your treatment plan to lower the risk of side effects and to make sure your medications work as intended. This list may not describe all possible interactions. Give your health care provider a list of all the medicines, herbs, non-prescription drugs, or dietary supplements you use. Also tell them if you smoke, drink alcohol, or use illegal drugs. Some items may interact with your medicine. What should I watch for while using this medication? Your condition will be monitored carefully while you are receiving this medication. You may need blood work while taking this medication. This medication may make you feel generally unwell. This is not uncommon as chemotherapy can affect healthy cells as well as cancer cells. Report any side effects. Continue your course of treatment even though you feel ill unless your care team tells you to stop. This medication can cause serious allergic reactions. To reduce the risk, your care team may give you other medications to take before receiving this one. Be sure to follow the directions from your care team. This medication may increase your risk of getting an infection. Call your care team for advice if you get a fever, chills, sore throat, or other symptoms of a cold or flu. Do not treat  yourself. Try to avoid being around people who are sick. This medication may increase your risk to bruise or bleed. Call your care team if you notice any unusual bleeding. Be careful brushing or flossing your teeth or using a toothpick because you may get an infection or bleed more easily. If you have any dental work done, tell your dentist you are receiving this medication. Talk to your care team if you may be pregnant. Serious birth defects can occur if you take this medication during pregnancy. Talk to your care team before breastfeeding. Changes to your treatment plan may be needed. What side effects may I notice from receiving this medication? Side effects that you should report to your care team as soon as possible: Allergic reactions--skin rash, itching, hives, swelling of the face, lips, tongue, or throat Heart rhythm changes--fast or irregular heartbeat, dizziness, feeling faint or lightheaded, chest pain, trouble breathing Increase in blood pressure Infection--fever, chills, cough, sore throat, wounds that don't heal, pain or trouble when passing urine, general feeling of discomfort or being unwell Low blood pressure--dizziness, feeling faint or lightheaded, blurry vision Low red blood cell level--unusual weakness or fatigue, dizziness, headache, trouble breathing Painful swelling, warmth, or  redness of the skin, blisters or sores at the infusion site Pain, tingling, or numbness in the hands or feet Slow heartbeat--dizziness, feeling faint or lightheaded, confusion, trouble breathing, unusual weakness or fatigue Unusual bruising or bleeding Side effects that usually do not require medical attention (report to your care team if they continue or are bothersome): Diarrhea Hair loss Joint pain Loss of appetite Muscle pain Nausea Vomiting This list may not describe all possible side effects. Call your doctor for medical advice about side effects. You may report side effects to FDA at  1-800-FDA-1088. Where should I keep my medication? This medication is given in a hospital or clinic. It will not be stored at home. NOTE: This sheet is a summary. It may not cover all possible information. If you have questions about this medicine, talk to your doctor, pharmacist, or health care provider.  2024 Elsevier/Gold Standard (2022-03-06 00:00:00)  Carboplatin  Injection What is this medication? CARBOPLATIN  (KAR boe pla tin) treats some types of cancer. It works by slowing down the growth of cancer cells. This medicine may be used for other purposes; ask your health care provider or pharmacist if you have questions. COMMON BRAND NAME(S): Paraplatin  What should I tell my care team before I take this medication? They need to know if you have any of these conditions: Blood disorders Hearing problems Kidney disease Recent or ongoing radiation therapy An unusual or allergic reaction to carboplatin , cisplatin, other medications, foods, dyes, or preservatives Pregnant or trying to get pregnant Breast-feeding How should I use this medication? This medication is injected into a vein. It is given by your care team in a hospital or clinic setting. Talk to your care team about the use of this medication in children. Special care may be needed. Overdosage: If you think you have taken too much of this medicine contact a poison control center or emergency room at once. NOTE: This medicine is only for you. Do not share this medicine with others. What if I miss a dose? Keep appointments for follow-up doses. It is important not to miss your dose. Call your care team if you are unable to keep an appointment. What may interact with this medication? Medications for seizures Some antibiotics, such as amikacin, gentamicin, neomycin, streptomycin, tobramycin Vaccines This list may not describe all possible interactions. Give your health care provider a list of all the medicines, herbs,  non-prescription drugs, or dietary supplements you use. Also tell them if you smoke, drink alcohol, or use illegal drugs. Some items may interact with your medicine. What should I watch for while using this medication? Your condition will be monitored carefully while you are receiving this medication. You may need blood work while taking this medication. This medication may make you feel generally unwell. This is not uncommon, as chemotherapy can affect healthy cells as well as cancer cells. Report any side effects. Continue your course of treatment even though you feel ill unless your care team tells you to stop. In some cases, you may be given additional medications to help with side effects. Follow all directions for their use. This medication may increase your risk of getting an infection. Call your care team for advice if you get a fever, chills, sore throat, or other symptoms of a cold or flu. Do not treat yourself. Try to avoid being around people who are sick. Avoid taking medications that contain aspirin, acetaminophen , ibuprofen, naproxen, or ketoprofen unless instructed by your care team. These medications may hide a fever. Be  careful brushing or flossing your teeth or using a toothpick because you may get an infection or bleed more easily. If you have any dental work done, tell your dentist you are receiving this medication. Talk to your care team if you wish to become pregnant or think you might be pregnant. This medication can cause serious birth defects. Talk to your care team about effective forms of contraception. Do not breast-feed while taking this medication. What side effects may I notice from receiving this medication? Side effects that you should report to your care team as soon as possible: Allergic reactions--skin rash, itching, hives, swelling of the face, lips, tongue, or throat Infection--fever, chills, cough, sore throat, wounds that don't heal, pain or trouble when passing  urine, general feeling of discomfort or being unwell Low red blood cell level--unusual weakness or fatigue, dizziness, headache, trouble breathing Pain, tingling, or numbness in the hands or feet, muscle weakness, change in vision, confusion or trouble speaking, loss of balance or coordination, trouble walking, seizures Unusual bruising or bleeding Side effects that usually do not require medical attention (report to your care team if they continue or are bothersome): Hair loss Nausea Unusual weakness or fatigue Vomiting This list may not describe all possible side effects. Call your doctor for medical advice about side effects. You may report side effects to FDA at 1-800-FDA-1088. Where should I keep my medication? This medication is given in a hospital or clinic. It will not be stored at home. NOTE: This sheet is a summary. It may not cover all possible information. If you have questions about this medicine, talk to your doctor, pharmacist, or health care provider.  2024 Elsevier/Gold Standard (2022-02-06 00:00:00)

## 2023-11-12 NOTE — Progress Notes (Signed)
 Patient's spouse called to inquire about available assistance. Patient is insured and does not receive any type of government assistance, therefore does not qualify for [employer] brands. Advised they may apply for Medicaid or assistance through [facility] if available. He said that is ok and thanked me for my time.  He has my card for any additional financial questions or concerns.

## 2023-11-12 NOTE — Progress Notes (Signed)
 Ok to run taxol at 29ml/hr=60 minute infusion.

## 2023-11-13 ENCOUNTER — Other Ambulatory Visit: Payer: Self-pay

## 2023-11-13 ENCOUNTER — Ambulatory Visit: Payer: Medicare HMO | Admitting: Emergency Medicine

## 2023-11-13 ENCOUNTER — Ambulatory Visit
Admission: RE | Admit: 2023-11-13 | Discharge: 2023-11-13 | Disposition: A | Discharge status: Home / Self Care | Payer: Medicare HMO | Source: Ambulatory Visit | Attending: Radiation Oncology

## 2023-11-13 DIAGNOSIS — Z510 Encounter for antineoplastic radiation therapy: Secondary | ICD-10-CM | POA: Diagnosis not present

## 2023-11-13 DIAGNOSIS — C342 Malignant neoplasm of middle lobe, bronchus or lung: Secondary | ICD-10-CM | POA: Diagnosis not present

## 2023-11-13 LAB — RAD ONC ARIA SESSION SUMMARY
Course Elapsed Days: 16
Plan Fractions Treated to Date: 12
Plan Prescribed Dose Per Fraction: 2 Gy
Plan Total Fractions Prescribed: 30
Plan Total Prescribed Dose: 60 Gy
Reference Point Dosage Given to Date: 24 Gy
Reference Point Session Dosage Given: 2 Gy
Session Number: 12

## 2023-11-14 ENCOUNTER — Ambulatory Visit
Admission: RE | Admit: 2023-11-14 | Discharge: 2023-11-14 | Disposition: A | Discharge status: Home / Self Care | Payer: Medicare HMO | Source: Ambulatory Visit | Attending: Radiation Oncology | Admitting: Radiation Oncology

## 2023-11-14 ENCOUNTER — Other Ambulatory Visit: Payer: Self-pay

## 2023-11-14 DIAGNOSIS — C342 Malignant neoplasm of middle lobe, bronchus or lung: Secondary | ICD-10-CM | POA: Diagnosis not present

## 2023-11-14 DIAGNOSIS — Z510 Encounter for antineoplastic radiation therapy: Secondary | ICD-10-CM | POA: Diagnosis not present

## 2023-11-14 LAB — RAD ONC ARIA SESSION SUMMARY
Course Elapsed Days: 17
Plan Fractions Treated to Date: 13
Plan Prescribed Dose Per Fraction: 2 Gy
Plan Total Fractions Prescribed: 30
Plan Total Prescribed Dose: 60 Gy
Reference Point Dosage Given to Date: 26 Gy
Reference Point Session Dosage Given: 2 Gy
Session Number: 13

## 2023-11-15 ENCOUNTER — Ambulatory Visit: Admission: RE | Admit: 2023-11-15 | Payer: Medicare HMO | Source: Ambulatory Visit

## 2023-11-15 ENCOUNTER — Ambulatory Visit
Admission: RE | Admit: 2023-11-15 | Discharge: 2023-11-15 | Disposition: A | Discharge status: Home / Self Care | Payer: Medicare HMO | Source: Ambulatory Visit | Attending: Radiation Oncology

## 2023-11-15 ENCOUNTER — Other Ambulatory Visit: Payer: Self-pay | Admitting: Radiology

## 2023-11-15 ENCOUNTER — Other Ambulatory Visit: Payer: Self-pay | Admitting: Radiation Oncology

## 2023-11-15 ENCOUNTER — Other Ambulatory Visit: Payer: Self-pay

## 2023-11-15 DIAGNOSIS — C342 Malignant neoplasm of middle lobe, bronchus or lung: Secondary | ICD-10-CM

## 2023-11-15 DIAGNOSIS — Z510 Encounter for antineoplastic radiation therapy: Secondary | ICD-10-CM | POA: Diagnosis not present

## 2023-11-15 LAB — RAD ONC ARIA SESSION SUMMARY
Course Elapsed Days: 18
Plan Fractions Treated to Date: 14
Plan Prescribed Dose Per Fraction: 2 Gy
Plan Total Fractions Prescribed: 30
Plan Total Prescribed Dose: 60 Gy
Reference Point Dosage Given to Date: 28 Gy
Reference Point Session Dosage Given: 2 Gy
Session Number: 14

## 2023-11-15 MED ORDER — SUCRALFATE 1 G PO TABS: 1.0000 g | ORAL_TABLET | Freq: Four times a day (QID) | ORAL | 2 refills | Status: DC

## 2023-11-15 MED ORDER — SUCRALFATE 1 G PO TABS: 1.0000 g | ORAL_TABLET | Freq: Four times a day (QID) | ORAL | 1 refills | Status: DC | PRN

## 2023-11-15 NOTE — Progress Notes (Signed)
Pt's husband had left me a VM asking about a sleeping medication Dr Mitzi Hansen had suggested at the pt's appt today, and also when the pt would be able to apply the numbing cream to her port site.  I spoke to Dr.Moody via  who states he had suggested Zyquil OTC.  I spoke to [facility], IR scheduler, for guidance as to when the Emla cream can be used after a new port has been placed. Tiffany confirmed that as long as the glue has fallen off the incision sites and the incisions are intact, the cream can be used.  I called the pt's husband back to provide him with the information above. I explained that the numbing cream should be applied 42min-1hr prior to the pt's lab appt. Pts husband verbalized understanding. Pts husband asked which Zyquil he should purchase, I told him the brand or any generic version would be fine. No questions at the conclusion of our conversation. Pts husband knows to reach out with any questions or concerns.

## 2023-11-18 ENCOUNTER — Other Ambulatory Visit: Payer: Self-pay

## 2023-11-18 ENCOUNTER — Encounter (HOSPITAL_COMMUNITY): Payer: Self-pay

## 2023-11-18 ENCOUNTER — Ambulatory Visit: Payer: Medicare HMO

## 2023-11-18 ENCOUNTER — Inpatient Hospital Stay: Payer: Medicare HMO

## 2023-11-18 ENCOUNTER — Telehealth: Payer: Self-pay | Admitting: *Deleted

## 2023-11-18 ENCOUNTER — Ambulatory Visit
Admission: RE | Admit: 2023-11-18 | Discharge: 2023-11-18 | Disposition: A | Discharge status: Home / Self Care | Payer: Medicare HMO | Source: Ambulatory Visit | Attending: Radiation Oncology | Admitting: Radiation Oncology

## 2023-11-18 ENCOUNTER — Other Ambulatory Visit: Payer: Self-pay | Admitting: Radiation Oncology

## 2023-11-18 VITALS — BP 170/74 | HR 99 | Temp 98.9°F | Wt 128.5 lb

## 2023-11-18 DIAGNOSIS — Z5111 Encounter for antineoplastic chemotherapy: Secondary | ICD-10-CM | POA: Diagnosis not present

## 2023-11-18 DIAGNOSIS — C342 Malignant neoplasm of middle lobe, bronchus or lung: Secondary | ICD-10-CM

## 2023-11-18 DIAGNOSIS — Z510 Encounter for antineoplastic radiation therapy: Secondary | ICD-10-CM | POA: Diagnosis not present

## 2023-11-18 DIAGNOSIS — Z95828 Presence of other vascular implants and grafts: Secondary | ICD-10-CM

## 2023-11-18 LAB — RAD ONC ARIA SESSION SUMMARY
Course Elapsed Days: 21
Plan Fractions Treated to Date: 15
Plan Prescribed Dose Per Fraction: 2 Gy
Plan Total Fractions Prescribed: 30
Plan Total Prescribed Dose: 60 Gy
Reference Point Dosage Given to Date: 30 Gy
Reference Point Session Dosage Given: 2 Gy
Session Number: 15

## 2023-11-18 LAB — CBC WITH DIFFERENTIAL (CANCER CENTER ONLY)
Abs Immature Granulocytes: 0.02 10*3/uL (ref 0.00–0.07)
Basophils Absolute: 0 10*3/uL (ref 0.0–0.1)
Basophils Relative: 1 %
Eosinophils Absolute: 0 10*3/uL (ref 0.0–0.5)
Eosinophils Relative: 0 %
HCT: 32.1 % — ABNORMAL LOW (ref 36.0–46.0)
Hemoglobin: 11.1 g/dL — ABNORMAL LOW (ref 12.0–15.0)
Immature Granulocytes: 1 %
Lymphocytes Relative: 20 %
Lymphs Abs: 0.8 10*3/uL (ref 0.7–4.0)
MCH: 25.8 pg — ABNORMAL LOW (ref 26.0–34.0)
MCHC: 34.6 g/dL (ref 30.0–36.0)
MCV: 74.5 fL — ABNORMAL LOW (ref 80.0–100.0)
Monocytes Absolute: 0.2 10*3/uL (ref 0.1–1.0)
Monocytes Relative: 6 %
Neutro Abs: 2.8 10*3/uL (ref 1.7–7.7)
Neutrophils Relative %: 72 %
Platelet Count: 170 10*3/uL (ref 150–400)
RBC: 4.31 MIL/uL (ref 3.87–5.11)
RDW: 14.7 % (ref 11.5–15.5)
WBC Count: 3.8 10*3/uL — ABNORMAL LOW (ref 4.0–10.5)
nRBC: 0 % (ref 0.0–0.2)

## 2023-11-18 LAB — CMP (CANCER CENTER ONLY)
ALT: 24 U/L (ref 0–44)
AST: 24 U/L (ref 15–41)
Albumin: 3.9 g/dL (ref 3.5–5.0)
Alkaline Phosphatase: 45 U/L (ref 38–126)
Anion gap: 5 (ref 5–15)
BUN: 13 mg/dL (ref 8–23)
CO2: 29 mmol/L (ref 22–32)
Calcium: 9.1 mg/dL (ref 8.9–10.3)
Chloride: 93 mmol/L — ABNORMAL LOW (ref 98–111)
Creatinine: 0.61 mg/dL (ref 0.44–1.00)
GFR, Estimated: >60 mL/min (ref >60)
Glucose, Bld: 142 mg/dL — ABNORMAL HIGH (ref 70–99)
Potassium: 3.6 mmol/L (ref 3.5–5.1)
Sodium: 127 mmol/L — ABNORMAL LOW (ref 135–145)
Total Bilirubin: 0.5 mg/dL (ref 0.0–1.2)
Total Protein: 6.5 g/dL (ref 6.5–8.1)

## 2023-11-18 MED ORDER — FAMOTIDINE 10 MG PO TABS: 10.0000 mg | ORAL_TABLET | Freq: Two times a day (BID) | ORAL | 3 refills | Status: DC

## 2023-11-18 MED ORDER — PALONOSETRON HCL INJECTION 0.25 MG/5ML
0.2500 mg | Freq: Once | INTRAVENOUS | Status: AC
  Administered 2023-11-18: 0.25 mg via INTRAVENOUS
  Filled 2023-11-18: qty 5

## 2023-11-18 MED ORDER — SODIUM CHLORIDE 0.9 % IV SOLN
45.0000 mg/m2 | Freq: Once | INTRAVENOUS | Status: AC
  Administered 2023-11-18: 72 mg via INTRAVENOUS
  Filled 2023-11-18: qty 12

## 2023-11-18 MED ORDER — FAMOTIDINE IN NACL 20-0.9 MG/50ML-% IV SOLN
20.0000 mg | Freq: Once | INTRAVENOUS | Status: AC
  Administered 2023-11-18: 20 mg via INTRAVENOUS
  Filled 2023-11-18: qty 50

## 2023-11-18 MED ORDER — DEXAMETHASONE SODIUM PHOSPHATE 10 MG/ML IJ SOLN
10.0000 mg | Freq: Once | INTRAMUSCULAR | Status: AC
  Administered 2023-11-18: 10 mg via INTRAVENOUS
  Filled 2023-11-18: qty 1

## 2023-11-18 MED ORDER — SODIUM CHLORIDE 0.9% FLUSH
10.0000 mL | Freq: Once | INTRAVENOUS | Status: AC
  Administered 2023-11-18: 10 mL

## 2023-11-18 MED ORDER — HEPARIN SOD (PORK) LOCK FLUSH 100 UNIT/ML IV SOLN
500.0000 [IU] | Freq: Once | INTRAVENOUS | Status: AC | PRN
  Administered 2023-11-18: 500 [IU]

## 2023-11-18 MED ORDER — DIPHENHYDRAMINE HCL 50 MG/ML IJ SOLN
50.0000 mg | Freq: Once | INTRAMUSCULAR | Status: AC
  Administered 2023-11-18: 50 mg via INTRAVENOUS
  Filled 2023-11-18: qty 1

## 2023-11-18 MED ORDER — SODIUM CHLORIDE 0.9 % IV SOLN
147.6000 mg | Freq: Once | INTRAVENOUS | Status: AC
  Administered 2023-11-18: 150 mg via INTRAVENOUS
  Filled 2023-11-18: qty 15

## 2023-11-18 MED ORDER — SODIUM CHLORIDE 0.9 % IV SOLN: INTRAVENOUS | Status: DC

## 2023-11-18 MED ORDER — ESZOPICLONE 2 MG PO TABS: 2.0000 mg | ORAL_TABLET | Freq: Every evening | ORAL | 3 refills | Status: DC | PRN

## 2023-11-18 MED ORDER — SODIUM CHLORIDE 0.9% FLUSH
10.0000 mL | INTRAVENOUS | Status: DC | PRN
  Administered 2023-11-18: 10 mL

## 2023-11-18 NOTE — Patient Instructions (Signed)
 [facility] - A DEPT OF [facility]  Discharge Instructions: Thank you for choosing [facility] to provide your oncology and hematology care.   If you have a lab appointment with the Cancer Center, please go directly to the Cancer Center and check in at the registration area.   Wear comfortable clothing and clothing appropriate for easy access to any Portacath or PICC line.   We strive to give you quality time with your provider. You may need to reschedule your appointment if you arrive late (15 or more minutes).  Arriving late affects you and other patients whose appointments are after yours.  Also, if you miss three or more appointments without notifying the office, you may be dismissed from the clinic at the provider's discretion.      For prescription refill requests, have your pharmacy contact our office and allow 72 hours for refills to be completed.    Today you received the following chemotherapy and/or immunotherapy agents: Taxol, Carboplatin      To help prevent nausea and vomiting after your treatment, we encourage you to take your nausea medication as directed.  BELOW ARE SYMPTOMS THAT SHOULD BE REPORTED IMMEDIATELY: *FEVER GREATER THAN 100.4 F (38 C) OR HIGHER *CHILLS OR SWEATING *NAUSEA AND VOMITING THAT IS NOT CONTROLLED WITH YOUR NAUSEA MEDICATION *UNUSUAL SHORTNESS OF BREATH *UNUSUAL BRUISING OR BLEEDING *URINARY PROBLEMS (pain or burning when urinating, or frequent urination) *BOWEL PROBLEMS (unusual diarrhea, constipation, pain near the anus) TENDERNESS IN MOUTH AND THROAT WITH OR WITHOUT PRESENCE OF ULCERS (sore throat, sores in mouth, or a toothache) UNUSUAL RASH, SWELLING OR PAIN  UNUSUAL VAGINAL DISCHARGE OR ITCHING   Items with * indicate a potential emergency and should be followed up as soon as possible or go to the Emergency Department if any problems should occur.  Please show the CHEMOTHERAPY ALERT CARD or  IMMUNOTHERAPY ALERT CARD at check-in to the Emergency Department and triage nurse.  Should you have questions after your visit or need to cancel or reschedule your appointment, please contact [facility] - A DEPT OF [facility]  Dept: [phone]  and follow the prompts.  Office hours are 8:00 a.m. to 4:30 p.m. Monday - Friday. Please note that voicemails left after 4:00 p.m. may not be returned until the following business day.  We are closed weekends and major holidays. You have access to a nurse at all times for urgent questions. Please call the main number to the clinic Dept: [phone] and follow the prompts.   For any non-urgent questions, you may also contact your provider using MyChart. We now offer e-Visits for anyone 54 and older to request care online for non-urgent symptoms. For details visit [email or web address].   Also download the MyChart app! Go to the app store, search "MyChart", open the app, select [place], and log in with your MyChart username and password.

## 2023-11-18 NOTE — Telephone Encounter (Signed)
Patient's spouse left VM that he was concerned because she couldn't swallow anything and was there something that could be prescribed for her.  Per RN who provided patient's care in infusion, patient attempted and unable to swallow yogurt while receiving infusion.   Contacted Mr. Ashley Pratt - He said that they are in Radiation Oncology dept now for patient's radiation treatment. Dr. Kathrynn Running is coming to evaluate Ms. Chaudry while she is there. Mr. Ashley Pratt said she'd been prescribed sulcrafate in past, but it did not help, so he hopes Dr. Kathrynn Running can prescribe something for her today.  He said she also has follow up appt with RD next week and hope they have suggestions to help her swallow food.  Routed to provider as Lorain Childes

## 2023-11-19 ENCOUNTER — Ambulatory Visit
Admission: RE | Admit: 2023-11-19 | Discharge: 2023-11-19 | Disposition: A | Discharge status: Home / Self Care | Payer: Medicare HMO | Source: Ambulatory Visit | Attending: Radiation Oncology | Admitting: Radiation Oncology

## 2023-11-19 ENCOUNTER — Other Ambulatory Visit: Payer: Self-pay

## 2023-11-19 ENCOUNTER — Other Ambulatory Visit: Payer: Self-pay | Admitting: Medical Oncology

## 2023-11-19 DIAGNOSIS — Z510 Encounter for antineoplastic radiation therapy: Secondary | ICD-10-CM | POA: Diagnosis not present

## 2023-11-19 DIAGNOSIS — K209 Esophagitis, unspecified without bleeding: Secondary | ICD-10-CM

## 2023-11-19 DIAGNOSIS — C342 Malignant neoplasm of middle lobe, bronchus or lung: Secondary | ICD-10-CM | POA: Diagnosis not present

## 2023-11-19 DIAGNOSIS — T66XXXA Radiation sickness, unspecified, initial encounter: Secondary | ICD-10-CM

## 2023-11-19 LAB — RAD ONC ARIA SESSION SUMMARY
Course Elapsed Days: 22
Plan Fractions Treated to Date: 16
Plan Prescribed Dose Per Fraction: 2 Gy
Plan Total Fractions Prescribed: 30
Plan Total Prescribed Dose: 60 Gy
Reference Point Dosage Given to Date: 31.9516 Gy
Reference Point Session Dosage Given: 1.9516 Gy
Session Number: 16

## 2023-11-19 MED ORDER — FAMOTIDINE 20 MG PO TABS: 20.0000 mg | ORAL_TABLET | Freq: Two times a day (BID) | ORAL | 1 refills | Status: DC

## 2023-11-19 NOTE — Progress Notes (Signed)
Husband notified of zantac rx called to preferred pharmacy.

## 2023-11-20 ENCOUNTER — Other Ambulatory Visit: Payer: Self-pay

## 2023-11-20 ENCOUNTER — Telehealth: Payer: Self-pay | Admitting: *Deleted

## 2023-11-20 ENCOUNTER — Ambulatory Visit
Admission: RE | Admit: 2023-11-20 | Discharge: 2023-11-20 | Disposition: A | Discharge status: Home / Self Care | Payer: Medicare HMO | Source: Ambulatory Visit | Attending: Radiation Oncology

## 2023-11-20 DIAGNOSIS — C3490 Malignant neoplasm of unspecified part of unspecified bronchus or lung: Secondary | ICD-10-CM | POA: Diagnosis not present

## 2023-11-20 DIAGNOSIS — Z510 Encounter for antineoplastic radiation therapy: Secondary | ICD-10-CM | POA: Diagnosis not present

## 2023-11-20 DIAGNOSIS — C342 Malignant neoplasm of middle lobe, bronchus or lung: Secondary | ICD-10-CM | POA: Diagnosis not present

## 2023-11-20 LAB — RAD ONC ARIA SESSION SUMMARY
Course Elapsed Days: 23
Course Elapsed Days: 23
Plan Fractions Treated to Date: 16
Plan Fractions Treated to Date: 17
Plan Prescribed Dose Per Fraction: 2 Gy
Plan Prescribed Dose Per Fraction: 2 Gy
Plan Total Fractions Prescribed: 30
Plan Total Fractions Prescribed: 30
Plan Total Prescribed Dose: 60 Gy
Plan Total Prescribed Dose: 60 Gy
Reference Point Dosage Given to Date: 32 Gy
Reference Point Dosage Given to Date: 34 Gy
Reference Point Session Dosage Given: 0.0484 Gy
Reference Point Session Dosage Given: 2 Gy
Session Number: 17
Session Number: 18

## 2023-11-20 NOTE — Telephone Encounter (Signed)
Mr. Craddick called again with concern about his wife's inability to swallow food. States she can only swallow liquids. He is very concerned that she isn't getting enough nutrition to go through treatments. He asked if Dr. Mitzi Hansen or Dr. Arbutus Ped would order an xray to find out what is obstructing her throat. He said the Zantac that was ordered has not helped.   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 Mr Vivenzio said he would encourage his wife to continue the zantac 20 mg bid (she's only had 3 doses) and continue fluids. He said she is drinking boost and ensure. He plans to discuss further with Dr. Mitzi Hansen on Friday when his wife has an appt with him.

## 2023-11-21 ENCOUNTER — Ambulatory Visit
Admission: RE | Admit: 2023-11-21 | Discharge: 2023-11-21 | Disposition: A | Discharge status: Home / Self Care | Payer: Medicare HMO | Source: Ambulatory Visit | Attending: Radiation Oncology | Admitting: Radiation Oncology

## 2023-11-21 ENCOUNTER — Other Ambulatory Visit: Payer: Self-pay | Admitting: Radiation Oncology

## 2023-11-21 ENCOUNTER — Encounter (HOSPITAL_COMMUNITY): Payer: Self-pay

## 2023-11-21 ENCOUNTER — Other Ambulatory Visit: Payer: Self-pay

## 2023-11-21 ENCOUNTER — Ambulatory Visit: Payer: Medicare HMO

## 2023-11-21 DIAGNOSIS — Z510 Encounter for antineoplastic radiation therapy: Secondary | ICD-10-CM | POA: Diagnosis not present

## 2023-11-21 DIAGNOSIS — C342 Malignant neoplasm of middle lobe, bronchus or lung: Secondary | ICD-10-CM | POA: Diagnosis not present

## 2023-11-21 LAB — RAD ONC ARIA SESSION SUMMARY
Course Elapsed Days: 24
Plan Fractions Treated to Date: 18
Plan Prescribed Dose Per Fraction: 2 Gy
Plan Total Fractions Prescribed: 30
Plan Total Prescribed Dose: 60 Gy
Reference Point Dosage Given to Date: 36 Gy
Reference Point Session Dosage Given: 2 Gy
Session Number: 19

## 2023-11-21 MED ORDER — HYDROCODONE-ACETAMINOPHEN 7.5-325 MG/15ML PO SOLN: 10.0000 mL | Freq: Four times a day (QID) | ORAL | 0 refills | Status: DC | PRN

## 2023-11-22 ENCOUNTER — Other Ambulatory Visit: Payer: Self-pay

## 2023-11-22 ENCOUNTER — Ambulatory Visit: Payer: Medicare HMO

## 2023-11-22 ENCOUNTER — Ambulatory Visit
Admission: RE | Admit: 2023-11-22 | Discharge: 2023-11-22 | Disposition: A | Discharge status: Home / Self Care | Payer: Medicare HMO | Source: Ambulatory Visit | Attending: Radiation Oncology

## 2023-11-22 DIAGNOSIS — C342 Malignant neoplasm of middle lobe, bronchus or lung: Secondary | ICD-10-CM | POA: Diagnosis not present

## 2023-11-22 DIAGNOSIS — Z510 Encounter for antineoplastic radiation therapy: Secondary | ICD-10-CM | POA: Diagnosis not present

## 2023-11-22 LAB — RAD ONC ARIA SESSION SUMMARY
Course Elapsed Days: 25
Plan Fractions Treated to Date: 19
Plan Prescribed Dose Per Fraction: 2 Gy
Plan Total Fractions Prescribed: 30
Plan Total Prescribed Dose: 60 Gy
Reference Point Dosage Given to Date: 38 Gy
Reference Point Session Dosage Given: 2 Gy
Session Number: 20

## 2023-11-25 ENCOUNTER — Ambulatory Visit
Admission: RE | Admit: 2023-11-25 | Discharge: 2023-11-25 | Disposition: A | Discharge status: Home / Self Care | Payer: Medicare HMO | Source: Ambulatory Visit | Attending: Radiation Oncology | Admitting: Radiation Oncology

## 2023-11-25 ENCOUNTER — Inpatient Hospital Stay (HOSPITAL_BASED_OUTPATIENT_CLINIC_OR_DEPARTMENT_OTHER): Payer: Medicare HMO | Admitting: Internal Medicine

## 2023-11-25 ENCOUNTER — Inpatient Hospital Stay: Payer: Medicare HMO

## 2023-11-25 ENCOUNTER — Other Ambulatory Visit: Payer: Self-pay

## 2023-11-25 VITALS — BP 138/68 | HR 92 | Temp 98.0°F | Resp 15 | Ht 60.0 in | Wt 122.5 lb

## 2023-11-25 DIAGNOSIS — Z95828 Presence of other vascular implants and grafts: Secondary | ICD-10-CM

## 2023-11-25 DIAGNOSIS — Z5111 Encounter for antineoplastic chemotherapy: Secondary | ICD-10-CM | POA: Diagnosis not present

## 2023-11-25 DIAGNOSIS — C342 Malignant neoplasm of middle lobe, bronchus or lung: Secondary | ICD-10-CM

## 2023-11-25 DIAGNOSIS — Z510 Encounter for antineoplastic radiation therapy: Secondary | ICD-10-CM | POA: Diagnosis not present

## 2023-11-25 LAB — CBC WITH DIFFERENTIAL (CANCER CENTER ONLY)
Abs Immature Granulocytes: 0.01 10*3/uL (ref 0.00–0.07)
Basophils Absolute: 0 10*3/uL (ref 0.0–0.1)
Basophils Relative: 1 %
Eosinophils Absolute: 0 10*3/uL (ref 0.0–0.5)
Eosinophils Relative: 0 %
HCT: 32.9 % — ABNORMAL LOW (ref 36.0–46.0)
Hemoglobin: 11 g/dL — ABNORMAL LOW (ref 12.0–15.0)
Immature Granulocytes: 0 %
Lymphocytes Relative: 21 %
Lymphs Abs: 0.6 10*3/uL — ABNORMAL LOW (ref 0.7–4.0)
MCH: 26.4 pg (ref 26.0–34.0)
MCHC: 33.4 g/dL (ref 30.0–36.0)
MCV: 78.9 fL — ABNORMAL LOW (ref 80.0–100.0)
Monocytes Absolute: 0.2 10*3/uL (ref 0.1–1.0)
Monocytes Relative: 8 %
Neutro Abs: 2 10*3/uL (ref 1.7–7.7)
Neutrophils Relative %: 70 %
Platelet Count: 149 10*3/uL — ABNORMAL LOW (ref 150–400)
RBC: 4.17 MIL/uL (ref 3.87–5.11)
RDW: 15.2 % (ref 11.5–15.5)
WBC Count: 2.9 10*3/uL — ABNORMAL LOW (ref 4.0–10.5)
nRBC: 0 % (ref 0.0–0.2)

## 2023-11-25 LAB — CMP (CANCER CENTER ONLY)
ALT: 26 U/L (ref 0–44)
AST: 19 U/L (ref 15–41)
Albumin: 3.8 g/dL (ref 3.5–5.0)
Alkaline Phosphatase: 41 U/L (ref 38–126)
Anion gap: 4 — ABNORMAL LOW (ref 5–15)
BUN: 19 mg/dL (ref 8–23)
CO2: 28 mmol/L (ref 22–32)
Calcium: 9.1 mg/dL (ref 8.9–10.3)
Chloride: 97 mmol/L — ABNORMAL LOW (ref 98–111)
Creatinine: 0.68 mg/dL (ref 0.44–1.00)
GFR, Estimated: >60 mL/min (ref >60)
Glucose, Bld: 130 mg/dL — ABNORMAL HIGH (ref 70–99)
Potassium: 3.8 mmol/L (ref 3.5–5.1)
Sodium: 129 mmol/L — ABNORMAL LOW (ref 135–145)
Total Bilirubin: 0.5 mg/dL (ref 0.0–1.2)
Total Protein: 6.4 g/dL — ABNORMAL LOW (ref 6.5–8.1)

## 2023-11-25 LAB — RAD ONC ARIA SESSION SUMMARY
Course Elapsed Days: 28
Plan Fractions Treated to Date: 20
Plan Prescribed Dose Per Fraction: 2 Gy
Plan Total Fractions Prescribed: 30
Plan Total Prescribed Dose: 60 Gy
Reference Point Dosage Given to Date: 40 Gy
Reference Point Session Dosage Given: 2 Gy
Session Number: 21

## 2023-11-25 MED ORDER — SODIUM CHLORIDE 0.9% FLUSH
10.0000 mL | Freq: Once | INTRAVENOUS | Status: AC
  Administered 2023-11-25: 10 mL

## 2023-11-25 MED ORDER — SODIUM CHLORIDE 0.9 % IV SOLN: INTRAVENOUS | Status: DC

## 2023-11-25 MED ORDER — SODIUM CHLORIDE 0.9% FLUSH
10.0000 mL | INTRAVENOUS | Status: DC | PRN
  Administered 2023-11-25: 10 mL

## 2023-11-25 MED ORDER — DEXAMETHASONE SODIUM PHOSPHATE 10 MG/ML IJ SOLN
10.0000 mg | Freq: Once | INTRAMUSCULAR | Status: AC
Start: 2023-11-25 — End: 2023-11-25
  Administered 2023-11-25: 10 mg via INTRAVENOUS
  Filled 2023-11-25: qty 1

## 2023-11-25 MED ORDER — HEPARIN SOD (PORK) LOCK FLUSH 100 UNIT/ML IV SOLN
500.0000 [IU] | Freq: Once | INTRAVENOUS | Status: AC | PRN
  Administered 2023-11-25: 500 [IU]

## 2023-11-25 MED ORDER — SODIUM CHLORIDE 0.9 % IV SOLN
45.0000 mg/m2 | Freq: Once | INTRAVENOUS | Status: AC
  Administered 2023-11-25: 72 mg via INTRAVENOUS
  Filled 2023-11-25: qty 12

## 2023-11-25 MED ORDER — SODIUM CHLORIDE 0.9 % IV SOLN
147.6000 mg | Freq: Once | INTRAVENOUS | Status: AC
  Administered 2023-11-25: 150 mg via INTRAVENOUS
  Filled 2023-11-25: qty 15

## 2023-11-25 MED ORDER — FAMOTIDINE IN NACL 20-0.9 MG/50ML-% IV SOLN
20.0000 mg | Freq: Once | INTRAVENOUS | Status: AC
  Administered 2023-11-25: 20 mg via INTRAVENOUS
  Filled 2023-11-25: qty 50

## 2023-11-25 MED ORDER — DIPHENHYDRAMINE HCL 50 MG/ML IJ SOLN
50.0000 mg | Freq: Once | INTRAMUSCULAR | Status: AC
  Administered 2023-11-25: 50 mg via INTRAVENOUS
  Filled 2023-11-25: qty 1

## 2023-11-25 MED ORDER — PALONOSETRON HCL INJECTION 0.25 MG/5ML
0.2500 mg | Freq: Once | INTRAVENOUS | Status: AC
Start: 2023-11-25 — End: 2023-11-25
  Administered 2023-11-25: 0.25 mg via INTRAVENOUS
  Filled 2023-11-25: qty 5

## 2023-11-25 NOTE — Progress Notes (Signed)
Nutrition Follow-up:  Patient with lung cancer.  Patient is receiving concurrent chemotherapy and radiation, followed by Dr Mitzi Hansen and Arbutus Ped.  Met with patient and husband.  Husband reports poor po intake especially 3 days ago unable to take boost shake.  Yesterday able to drink 2 boost shakes, eat popscile.  This am ate crackers and drank tea.  Husband reported carafate caused rash so have discontinued.  Has been taking hycet but does not sound like every 6 hours.  MD wanting her to restart protonix per husband    Medications: reviewed  Labs: glucose 130, Na 129  Anthropometrics:   Weight 122 lb 8 oz today  127 lb 12.8 oz on 1/8 UBW of 135 lb (10/10/23)  9% weight loss in the last month and half, significant   NUTRITION DIAGNOSIS: Unintentional weight loss continues   INTERVENTION:  Discussed importance of taking hycet pain medication to make it easier to eat.  Reviewed prescription instructions. Recommend 350 + calorie shake vs current 250 calories shake.  Discussed ways to thin shake if too thick.  Discussed pureeing, blending foods for ease of swallowing. Encouraged small frequent sips/nibbles    MONITORING, EVALUATION, GOAL: weight trends, intake   NEXT VISIT: Tuesday, Feb 11 during infusion  Cristal Qadir B. Freida Busman, RD, LDN Registered Dietitian [phone]

## 2023-11-25 NOTE — Patient Instructions (Signed)
[facility] A DEPT OF [facility]  Discharge Instructions: Thank you for choosing [facility] to provide your oncology and hematology care.   If you have a lab appointment with the Cancer Center, please go directly to the Cancer Center and check in at the registration area.   Wear comfortable clothing and clothing appropriate for easy access to any Portacath or PICC line.   We strive to give you quality time with your provider. You may need to reschedule your appointment if you arrive late (15 or more minutes).  Arriving late affects you and other patients whose appointments are after yours.  Also, if you miss three or more appointments without notifying the office, you may be dismissed from the clinic at the provider's discretion.      For prescription refill requests, have your pharmacy contact our office and allow 72 hours for refills to be completed.    Today you received the following chemotherapy and/or immunotherapy agents paclitaxel      To help prevent nausea and vomiting after your treatment, we encourage you to take your nausea medication as directed.  BELOW ARE SYMPTOMS THAT SHOULD BE REPORTED IMMEDIATELY: *FEVER GREATER THAN 100.4 F (38 C) OR HIGHER *CHILLS OR SWEATING *NAUSEA AND VOMITING THAT IS NOT CONTROLLED WITH YOUR NAUSEA MEDICATION *UNUSUAL SHORTNESS OF BREATH *UNUSUAL BRUISING OR BLEEDING *URINARY PROBLEMS (pain or burning when urinating, or frequent urination) *BOWEL PROBLEMS (unusual diarrhea, constipation, pain near the anus) TENDERNESS IN MOUTH AND THROAT WITH OR WITHOUT PRESENCE OF ULCERS (sore throat, sores in mouth, or a toothache) UNUSUAL RASH, SWELLING OR PAIN  UNUSUAL VAGINAL DISCHARGE OR ITCHING   Items with * indicate a potential emergency and should be followed up as soon as possible or go to the Emergency Department if any problems should occur.  Please show the CHEMOTHERAPY ALERT CARD or IMMUNOTHERAPY  ALERT CARD at check-in to the Emergency Department and triage nurse.  Should you have questions after your visit or need to cancel or reschedule your appointment, please contact [facility] - A DEPT OF [facility]  Dept: [phone]  and follow the prompts.  Office hours are 8:00 a.m. to 4:30 p.m. Monday - Friday. Please note that voicemails left after 4:00 p.m. may not be returned until the following business day.  We are closed weekends and major holidays. You have access to a nurse at all times for urgent questions. Please call the main number to the clinic Dept: [phone] and follow the prompts.   For any non-urgent questions, you may also contact your provider using MyChart. We now offer e-Visits for anyone 38 and older to request care online for non-urgent symptoms. For details visit [email or web address].   Also download the MyChart app! Go to the app store, search "MyChart", open the app, select [place], and log in with your MyChart username and password.

## 2023-11-25 NOTE — Progress Notes (Signed)
[facility] Telephone:([phone]) [phone]   [phone]  OFFICE PROGRESS NOTE  Georgann Housekeeper, MD [address] [place] [address]  DIAGNOSIS:  stage IIIb (T1c, N3, M0) non-small cell lung cancer, adenocarcinoma presented with right middle lobe lung nodule in addition to bilateral hilar and mediastinal lymphadenopathy diagnosed in December 2024.   Biomarker Findings HRD signature - HRDsig Negative Microsatellite status - MS-Stable Tumor Mutational Burden - 2 Muts/Mb Genomic Findings For a complete list of the genes assayed, please refer to the Appendix. EGFR G719C, E709V, amplification CDKN2A loss - equivocal? MTAP loss - equivocal? CDKN2B loss - equivocal? FGF23 R187W - subclonal? TP53 R248L 7 Disease relevant genes with no reportable alterations: ALK, BRAF, ERBB2, KRAS, MET, RET, ROS1  PDL1 Expression 30%   PRIOR THERAPY: None  CURRENT THERAPY: Concurrent chemoradiation with weekly carboplatin for AUC of 2 and paclitaxel 45 Mg/M2.  First dose October 29, 2023.   INTERVAL HISTORY: Ashley Pratt 73 y.o. female returns to the clinic today for follow-up visit accompanied by her husband. Discussed the use of AI scribe software for clinical note transcription with the patient, who gave verbal consent to proceed.  History of Present Illness   The patient, a 73 year old with a history of non-small cell lung cancer, presents with complaints of painful swallowing, which she attributes to recent radiation therapy. She describes the pain as severe enough to significantly impact her ability to eat and drink. Despite attempts to manage the pain with hydrocodone and sucralfate, the patient reports only minimal relief. She also reports a rash and itching, which she believes to be a reaction to the Carafate.  The patient has been undergoing concurrent chemotherapy and radiation therapy, with four cycles completed to date. She has been  experiencing these symptoms since the initiation of her treatment. Despite the discomfort, the patient is determined to continue with the treatment plan, acknowledging the importance of the therapy for her overall prognosis.  In addition to the cancer treatment, the patient has been managing her symptoms with over-the-counter omeprazole, which was previously discontinued but is now being reconsidered due to the persistent issues with swallowing and eating. The patient's white blood cell count is slightly low, but still within acceptable limits for continuing treatment.       MEDICAL HISTORY: Past Medical History:  Diagnosis Date   Aortic stenosis    Arthritis    Breast cancer (HCC)    Chronic back pain    Colon polyps    GERD (gastroesophageal reflux disease)    Hypertension    Malignant neoplasm of right female breast (HCC)    unspecified site of breast   Osteoarthritis    of the knee left worse than right   Prediabetes     ALLERGIES:  is allergic to aspirin, gabapentin, losartan potassium-hctz, and penicillins.  MEDICATIONS:  Current Outpatient Medications  Medication Sig Dispense Refill   eszopiclone (LUNESTA) 2 MG TABS tablet Take 1 tablet (2 mg total) by mouth at bedtime as needed for sleep. Take immediately before bedtime 20 tablet 3   famotidine (ZANTAC 360 MAX ST) 20 MG tablet Take 1 tablet (20 mg total) by mouth 2 (two) times daily. 60 tablet 1   HYDROcodone-acetaminophen (HYCET) 7.5-325 mg/15 ml solution Take 10 mLs by mouth every 6 (six) hours as needed for moderate pain (pain score 4-6). 300 mL 0   lidocaine-prilocaine (EMLA) cream Apply 1 Application topically as needed. 30 g 2  losartan (COZAAR) 100 MG tablet Take 100 mg by mouth daily.     magnesium gluconate (MAGONATE) 500 MG tablet Take 500 mg by mouth 2 (two) times daily.     Multiple Vitamin (MULTIVITAMIN) capsule Take 1 capsule by mouth daily.     ondansetron (ZOFRAN) 8 MG tablet Take 1 tablet (8 mg total) by  mouth every 8 (eight) hours as needed for nausea or vomiting. Start on the third day after chemotherapy. 30 tablet 1   pantoprazole (PROTONIX) 20 MG tablet Take 20 mg by mouth daily.     prochlorperazine (COMPAZINE) 10 MG tablet Take 1 tablet (10 mg total) by mouth every 6 (six) hours as needed for nausea or vomiting. 30 tablet 1   sucralfate (CARAFATE) 1 g tablet Take 1 tablet (1 g total) by mouth 4 (four) times daily. Dissolve each tablet in 15 cc water before use. 120 tablet 2   sucralfate (CARAFATE) 1 g tablet Take 1 tablet (1 g total) by mouth 4 (four) times daily as needed. Take 30 minutes before mealtimes, and before bed. Mix with 15 cc's of water. 120 tablet 1   No current facility-administered medications for this visit.    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   BREAST LUMPECTOMY WITH NEEDLE LOCALIZATION Right 05/11/2013   Procedure: RIGHT BREAST NEEDLE LOCALIZATION  LUMPECTOMY;  Surgeon: Almond Lint, MD;  Location: [facility];  Service: General;  Laterality: Right;   BREAST LUMPECTOMY WITH RADIOACTIVE SEED LOCALIZATION Right 08/02/2020   Procedure: RIGHT BREAST LUMPECTOMY WITH RADIOACTIVE SEED LOCALIZATION;  Surgeon: Almond Lint, MD;  Location: [facility];  Service: General;  Laterality: Right;  RNFA   BRONCHIAL BIOPSY  10/07/2023   Procedure: BRONCHIAL BIOPSIES;  Surgeon: Leslye Peer, MD;  Location: [facility] END[facility]COPY;  Service: Pulmonary;;   BRONCHIAL BRUSHINGS  10/07/2023   Procedure: BRONCHIAL BRUSHINGS;  Surgeon: Leslye Peer, MD;  Location[facility] END[facility]COPY;  Service: Pulmonary;;   BRONCHIAL NEEDLE ASPIRATION BIOPSY  10/07/2023   Procedure: BRONCHIAL NEEDLE ASPIRATION BIOPSIES;  Surgeon: Leslye Peer, MD;  Location: [facility] ENDOSCOPY;  Service: Pulmonary;;   CHOLECYSTECTOMY      COLONOSCOPY     FOOT OSTEOTOMY     both  feet   IR IMAGING GUIDED PORT INSERTION  11/07/2023   TONSILLECTOMY     VIDEO BRONCHOSCOPY WITH ENDOBRONCHIAL ULTRASOUND N/A 10/07/2023   Procedure: VIDEO BRONCHOSCOPY WITH ENDOBRONCHIAL ULTRASOUND;  Surgeon: Leslye Peer, MD;  Location: [facility] ENDOSCOPY;  Service: Pulmonary;  Laterality: N/A;    REVIEW OF SYSTEMS:  Constitutional: positive for fatigue and weight loss Eyes: negative Ears, nose, mouth, throat, and face: negative Respiratory: negative Cardiovascular: negative Gastrointestinal: positive for dyspepsia and odynophagia Genitourinary:negative Integument/breast: negative Hematologic/lymphatic: negative Musculoskeletal:negative Neurological: negative Behavioral/Psych: negative Endocrine: negative Allergic/Immunologic: negative   PHYSICAL EXAMINATION: General appearance: alert, cooperative, fatigued, and no distress Head: Normocephalic, without obvious abnormality, atraumatic Neck: no adenopathy, no JVD, supple, symmetrical, trachea midline, and thyroid not enlarged, symmetric, no tenderness/mass/nodules Lymph nodes: Cervical, supraclavicular, and axillary nodes normal. Resp: clear to auscultation bilaterally Back: symmetric, no curvature. ROM normal. No CVA tenderness. Cardio: regular rate and rhythm, S1, S2 normal, no murmur, click, rub or gallop GI: soft, non-tender; bowel sounds normal; no masses,  no organomegaly Extremities: extremities normal, atraumatic, no cyanosis or edema Neurologic: Alert  and oriented X 3, normal strength and tone. Normal symmetric reflexes. Normal coordination and gait  ECOG PERFORMANCE STATUS: 1 - Symptomatic but completely ambulatory  Blood pressure 138/68, pulse 92, temperature 98 F (36.7 C), temperature source Temporal, resp. rate 15, height 5' (1.524 m), weight 122 lb 8 oz (55.6 kg), SpO2 100%.  LABORATORY DATA: Lab Results  Component Value Date   WBC 2.9 (L) 11/25/2023   HGB 11.0 (L)  11/25/2023   HCT 32.9 (L) 11/25/2023   MCV 78.9 (L) 11/25/2023   PLT 149 (L) 11/25/2023      Chemistry      Component Value Date/Time   NA 127 (L) 11/18/2023 1158   NA 139 10/17/2015 1322   K 3.6 11/18/2023 1158   K 4.1 10/17/2015 1322   CL 93 (L) 11/18/2023 1158   CO2 29 11/18/2023 1158   CO2 28 10/17/2015 1322   BUN 13 11/18/2023 1158   BUN 14.5 10/17/2015 1322   CREATININE 0.61 11/18/2023 1158   CREATININE 0.87 04/10/2016 1540   CREATININE 0.9 10/17/2015 1322      Component Value Date/Time   CALCIUM 9.1 11/18/2023 1158   CALCIUM 10.1 10/17/2015 1322   ALKPHOS 45 11/18/2023 1158   ALKPHOS 103 10/17/2015 1322   AST 24 11/18/2023 1158   AST 24 10/17/2015 1322   ALT 24 11/18/2023 1158   ALT 32 10/17/2015 1322   BILITOT 0.5 11/18/2023 1158   BILITOT 0.33 10/17/2015 1322       RADIOGRAPHIC STUDIES: IR IMAGING GUIDED PORT INSERTION Result Date: 11/07/2023 INDICATION: 73 year old female in need of durable venous access for chemotherapy. She presents for port catheter placement. EXAM: IMPLANTED PORT A CATH PLACEMENT WITH ULTRASOUND AND FLUOROSCOPIC GUIDANCE MEDICATIONS: None. ANESTHESIA/SEDATION: Versed 2 mg IV; Fentanyl 100 mcg IV; administered by the radiology nurse. Moderate Sedation Time:  13 minutes The patient's vital signs and level of consciousness were continuously monitored during the procedure by the interventional radiology nurse under my direct supervision. FLUOROSCOPY: Radiation exposure index: 2 mGy reference air kerma COMPLICATIONS: None immediate. PROCEDURE: The right neck and chest was prepped with chlorhexidine, and draped in the usual sterile fashion using maximum barrier technique (cap and mask, sterile gown, sterile gloves, large sterile sheet, hand hygiene and cutaneous antiseptic). Local anesthesia was attained by infiltration with 1% lidocaine with epinephrine. Ultrasound demonstrated patency of the right internal jugular vein, and this was documented with an  image. Under real-time ultrasound guidance, this vein was accessed with a 21 gauge micropuncture needle and image documentation was performed. A small dermatotomy was made at the access site with an 11 scalpel. A 0.018" wire was advanced into the SVC and the access needle exchanged for a 59F micropuncture vascular sheath. The 0.018" wire was then removed and a 0.035" wire advanced into the IVC. An appropriate location for the subcutaneous reservoir was selected below the clavicle and an incision was made through the skin and underlying soft tissues. The subcutaneous tissues were then dissected using a combination of blunt and sharp surgical technique and a pocket was formed. A single lumen power injectable portacatheter (Bard Clear Vue) was then tunneled through the subcutaneous tissues from the pocket to the dermatotomy and the port reservoir placed within the subcutaneous pocket. The venous access site was then serially dilated and a peel away vascular sheath placed over the wire. The wire was removed and the port catheter advanced into position under fluoroscopic guidance. The catheter tip is positioned in the superior cavoatrial junction. This was  documented with a spot image. The portacatheter was then tested and found to flush and aspirate well. The port was flushed with saline followed by 100 units/mL heparinized saline. The pocket was then closed in two layers using first subdermal inverted interrupted absorbable sutures followed by a running subcuticular suture. The epidermis was then sealed with Dermabond. The dermatotomy at the venous access site was also closed with Dermabond. IMPRESSION: Successful placement of a right IJ approach Power Port with ultrasound and fluoroscopic guidance. The catheter is ready for use. Electronically Signed   By: Malachy Moan M.D.   On: 11/07/2023 15:26    ASSESSMENT AND PLAN: This is a very pleasant 73 years old female with stage IIIb (T1c, N3, M0) non-small cell  lung cancer, adenocarcinoma presented with right middle lobe lung nodule in addition to bilateral hilar and mediastinal lymphadenopathy diagnosed in December 2024.  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 She is currently on concurrent chemoradiation with weekly carboplatin for AUC of 2 and paclitaxel 45 Mg/M2.  First dose October 29, 2023.  She is status post 4 cycles.     Stage IIIB Non-Small Cell Lung Cancer (NSCLC), Adenocarcinoma: 73 year old with NSCLC undergoing weekly carboplatin and paclitaxel chemotherapy, currently on the fifth cycle. Receiving radiation therapy for bilateral mediastinal lymph node involvement. Experiencing significant radiation-induced esophagitis with painful swallowing. Severe side effects from medications, including a rash from Carafate and minimal relief from hydrocodone. Discussed the necessity of continuing radiation despite side effects and potential modification of radiation direction. A scan will be performed in five weeks to evaluate treatment response. - Administer fifth cycle of carboplatin and paclitaxel - Continue radiation therapy - Discuss potential modification of radiation direction with radiation oncologist - Administer intravenous fluids during chemotherapy session - Reassess in two weeks - Plan for a scan in five weeks to evaluate treatment response - Discuss potential use of viscous lidocaine with radiation oncologist if symptoms worsen  Radiation-Induced Esophagitis Experiencing painful swallowing due to radiation-induced esophagitis. Hydrocodone provides minimal relief. Carafate was discontinued due to a rash. Famotidine and sucralfate were ineffective. Discussed restarting omeprazole to manage acid reflux and improve swallowing. Emphasized the importance of maintaining nutrition despite difficulty swallowing. - Restart Protonix for acid reflux - Continue  hydrocodone for pain management - Encourage small, frequent meals and soft foods - Monitor for potential need of viscous lidocaine for severe symptoms  Leukopenia White blood count is slightly low at 2.9, with an absolute neutrophil count of 2000, expected due to chemotherapy. Informed that if the white blood count drops further, chemotherapy may need to be held or canceled for a week. - Monitor white blood count closely - Hold or cancel chemotherapy if white blood count drops further  Follow-up - Schedule follow-up appointment in two weeks - Plan for a scan in five weeks to assess treatment response.   The patient was advised to call immediately if she has any other concerning symptoms in the interval. The patient voices understanding of current disease status and treatment options and is in agreement with the current care plan.  All questions were answered. The patient knows to call the clinic with any problems, questions or concerns. We can certainly see the patient much sooner if necessary.  The total time spent in the appointment was 30 minutes.  Disclaimer: This note was dictated with voice recognition software. Similar sounding words can inadvertently be transcribed and may not be corrected upon review.

## 2023-11-26 ENCOUNTER — Other Ambulatory Visit: Payer: Self-pay

## 2023-11-26 ENCOUNTER — Ambulatory Visit
Admission: RE | Admit: 2023-11-26 | Discharge: 2023-11-26 | Disposition: A | Discharge status: Home / Self Care | Payer: Medicare HMO | Source: Ambulatory Visit | Attending: Radiation Oncology

## 2023-11-26 DIAGNOSIS — C342 Malignant neoplasm of middle lobe, bronchus or lung: Secondary | ICD-10-CM | POA: Diagnosis not present

## 2023-11-26 DIAGNOSIS — Z510 Encounter for antineoplastic radiation therapy: Secondary | ICD-10-CM | POA: Diagnosis not present

## 2023-11-26 LAB — RAD ONC ARIA SESSION SUMMARY
Course Elapsed Days: 29
Plan Fractions Treated to Date: 21
Plan Prescribed Dose Per Fraction: 2 Gy
Plan Total Fractions Prescribed: 30
Plan Total Prescribed Dose: 60 Gy
Reference Point Dosage Given to Date: 42 Gy
Reference Point Session Dosage Given: 2 Gy
Session Number: 22

## 2023-11-27 ENCOUNTER — Ambulatory Visit
Admission: RE | Admit: 2023-11-27 | Discharge: 2023-11-27 | Disposition: A | Discharge status: Home / Self Care | Payer: Medicare HMO | Source: Ambulatory Visit | Attending: Radiation Oncology

## 2023-11-27 ENCOUNTER — Telehealth: Payer: Self-pay

## 2023-11-27 ENCOUNTER — Other Ambulatory Visit: Payer: Self-pay

## 2023-11-27 DIAGNOSIS — C342 Malignant neoplasm of middle lobe, bronchus or lung: Secondary | ICD-10-CM | POA: Diagnosis not present

## 2023-11-27 DIAGNOSIS — Z510 Encounter for antineoplastic radiation therapy: Secondary | ICD-10-CM | POA: Diagnosis not present

## 2023-11-27 LAB — RAD ONC ARIA SESSION SUMMARY
Course Elapsed Days: 30
Plan Fractions Treated to Date: 22
Plan Prescribed Dose Per Fraction: 2 Gy
Plan Total Fractions Prescribed: 30
Plan Total Prescribed Dose: 60 Gy
Reference Point Dosage Given to Date: 44 Gy
Reference Point Session Dosage Given: 2 Gy
Session Number: 23

## 2023-11-27 NOTE — Telephone Encounter (Signed)
Called this patient's husband back per his request to have his wife's appointments closer together. She has 1000 radiation and had labs and infusion following on 2/3.  I moved her labs to 1015 on 1/31 after her radiation appt so she could just come for radiation and treatment on 2/3. For 2/11, her radiation is at 1000 and her labs, MD, and infusion were following.  I moved her labs to 1015 after her radiation on 2/10. Her radiation visit on 2/11 is at 1000 and she is due to see Dr. Arbutus Ped at 1045 and infusion at 1115 but the husband is asking if her appt can be moved up to 1015/1030 so that infusion can be moved up as well?  I told him that Dr. Arbutus Ped did not have any other availability that day unless another patient moved or cancelled.  He still wanted to ask because he has to be out of work to bring her here. Routing message to [facility] and Dr. Asa Lente nursing team to see if we can get her appts closer together. Patient has mychart and saw that appts were updated.  He knows to call back if something changes. Lorayne Marek, RN

## 2023-11-28 ENCOUNTER — Other Ambulatory Visit: Payer: Self-pay

## 2023-11-28 ENCOUNTER — Ambulatory Visit
Admission: RE | Admit: 2023-11-28 | Discharge: 2023-11-28 | Disposition: A | Discharge status: Home / Self Care | Payer: Medicare HMO | Source: Ambulatory Visit | Attending: Radiation Oncology | Admitting: Radiation Oncology

## 2023-11-28 DIAGNOSIS — C342 Malignant neoplasm of middle lobe, bronchus or lung: Secondary | ICD-10-CM | POA: Diagnosis not present

## 2023-11-28 DIAGNOSIS — Z510 Encounter for antineoplastic radiation therapy: Secondary | ICD-10-CM | POA: Diagnosis not present

## 2023-11-28 LAB — RAD ONC ARIA SESSION SUMMARY
Course Elapsed Days: 31
Plan Fractions Treated to Date: 23
Plan Prescribed Dose Per Fraction: 2 Gy
Plan Total Fractions Prescribed: 30
Plan Total Prescribed Dose: 60 Gy
Reference Point Dosage Given to Date: 46 Gy
Reference Point Session Dosage Given: 2 Gy
Session Number: 24

## 2023-11-29 ENCOUNTER — Ambulatory Visit
Admission: RE | Admit: 2023-11-29 | Discharge: 2023-11-29 | Disposition: A | Discharge status: Home / Self Care | Payer: Medicare HMO | Source: Ambulatory Visit | Attending: Radiation Oncology

## 2023-11-29 ENCOUNTER — Other Ambulatory Visit: Payer: Medicare HMO

## 2023-11-29 ENCOUNTER — Inpatient Hospital Stay: Payer: Medicare HMO

## 2023-11-29 ENCOUNTER — Other Ambulatory Visit: Payer: Self-pay

## 2023-11-29 ENCOUNTER — Ambulatory Visit
Admission: RE | Admit: 2023-11-29 | Discharge: 2023-11-29 | Disposition: A | Discharge status: Home / Self Care | Payer: Medicare HMO | Source: Ambulatory Visit | Attending: Radiation Oncology | Admitting: Radiation Oncology

## 2023-11-29 DIAGNOSIS — Z510 Encounter for antineoplastic radiation therapy: Secondary | ICD-10-CM | POA: Diagnosis not present

## 2023-11-29 DIAGNOSIS — C342 Malignant neoplasm of middle lobe, bronchus or lung: Secondary | ICD-10-CM | POA: Diagnosis not present

## 2023-11-29 LAB — RAD ONC ARIA SESSION SUMMARY
Course Elapsed Days: 32
Plan Fractions Treated to Date: 24
Plan Prescribed Dose Per Fraction: 2 Gy
Plan Total Fractions Prescribed: 30
Plan Total Prescribed Dose: 60 Gy
Reference Point Dosage Given to Date: 48 Gy
Reference Point Session Dosage Given: 2 Gy
Session Number: 25

## 2023-12-02 ENCOUNTER — Ambulatory Visit
Admission: RE | Admit: 2023-12-02 | Discharge: 2023-12-02 | Disposition: A | Discharge status: Home / Self Care | Payer: HMO | Source: Ambulatory Visit | Attending: Radiation Oncology | Admitting: Radiation Oncology

## 2023-12-02 ENCOUNTER — Other Ambulatory Visit: Payer: Self-pay

## 2023-12-02 ENCOUNTER — Other Ambulatory Visit: Payer: Self-pay | Admitting: *Deleted

## 2023-12-02 ENCOUNTER — Other Ambulatory Visit: Payer: Medicare HMO

## 2023-12-02 ENCOUNTER — Inpatient Hospital Stay: Payer: HMO

## 2023-12-02 ENCOUNTER — Inpatient Hospital Stay: Payer: HMO | Attending: Hematology

## 2023-12-02 VITALS — BP 140/77 | Temp 98.6°F | Resp 18 | Wt 121.1 lb

## 2023-12-02 DIAGNOSIS — Z860100 Personal history of colon polyps, unspecified: Secondary | ICD-10-CM | POA: Diagnosis not present

## 2023-12-02 DIAGNOSIS — Z9221 Personal history of antineoplastic chemotherapy: Secondary | ICD-10-CM | POA: Diagnosis not present

## 2023-12-02 DIAGNOSIS — Z888 Allergy status to other drugs, medicaments and biological substances status: Secondary | ICD-10-CM | POA: Diagnosis not present

## 2023-12-02 DIAGNOSIS — R531 Weakness: Secondary | ICD-10-CM | POA: Insufficient documentation

## 2023-12-02 DIAGNOSIS — Y842 Radiological procedure and radiotherapy as the cause of abnormal reaction of the patient, or of later complication, without mention of misadventure at the time of the procedure: Secondary | ICD-10-CM | POA: Diagnosis not present

## 2023-12-02 DIAGNOSIS — Z79899 Other long term (current) drug therapy: Secondary | ICD-10-CM | POA: Insufficient documentation

## 2023-12-02 DIAGNOSIS — R1310 Dysphagia, unspecified: Secondary | ICD-10-CM | POA: Insufficient documentation

## 2023-12-02 DIAGNOSIS — I350 Nonrheumatic aortic (valve) stenosis: Secondary | ICD-10-CM | POA: Insufficient documentation

## 2023-12-02 DIAGNOSIS — G893 Neoplasm related pain (acute) (chronic): Secondary | ICD-10-CM | POA: Diagnosis not present

## 2023-12-02 DIAGNOSIS — Z823 Family history of stroke: Secondary | ICD-10-CM | POA: Diagnosis not present

## 2023-12-02 DIAGNOSIS — Z5111 Encounter for antineoplastic chemotherapy: Secondary | ICD-10-CM | POA: Diagnosis not present

## 2023-12-02 DIAGNOSIS — Z79630 Long term (current) use of alkylating agent: Secondary | ICD-10-CM | POA: Diagnosis not present

## 2023-12-02 DIAGNOSIS — Z9049 Acquired absence of other specified parts of digestive tract: Secondary | ICD-10-CM | POA: Diagnosis not present

## 2023-12-02 DIAGNOSIS — Z886 Allergy status to analgesic agent status: Secondary | ICD-10-CM | POA: Diagnosis not present

## 2023-12-02 DIAGNOSIS — R630 Anorexia: Secondary | ICD-10-CM | POA: Diagnosis not present

## 2023-12-02 DIAGNOSIS — D61818 Other pancytopenia: Secondary | ICD-10-CM | POA: Diagnosis not present

## 2023-12-02 DIAGNOSIS — Z923 Personal history of irradiation: Secondary | ICD-10-CM | POA: Diagnosis not present

## 2023-12-02 DIAGNOSIS — M8580 Other specified disorders of bone density and structure, unspecified site: Secondary | ICD-10-CM | POA: Diagnosis not present

## 2023-12-02 DIAGNOSIS — I10 Essential (primary) hypertension: Secondary | ICD-10-CM | POA: Diagnosis not present

## 2023-12-02 DIAGNOSIS — C342 Malignant neoplasm of middle lobe, bronchus or lung: Secondary | ICD-10-CM

## 2023-12-02 DIAGNOSIS — W44F3XA Food entering into or through a natural orifice, initial encounter: Secondary | ICD-10-CM | POA: Diagnosis not present

## 2023-12-02 DIAGNOSIS — Z880 Allergy status to penicillin: Secondary | ICD-10-CM | POA: Insufficient documentation

## 2023-12-02 DIAGNOSIS — Z95828 Presence of other vascular implants and grafts: Secondary | ICD-10-CM

## 2023-12-02 DIAGNOSIS — Z8249 Family history of ischemic heart disease and other diseases of the circulatory system: Secondary | ICD-10-CM | POA: Diagnosis not present

## 2023-12-02 DIAGNOSIS — Z9089 Acquired absence of other organs: Secondary | ICD-10-CM | POA: Diagnosis not present

## 2023-12-02 DIAGNOSIS — Z79633 Long term (current) use of mitotic inhibitor: Secondary | ICD-10-CM | POA: Diagnosis not present

## 2023-12-02 LAB — RAD ONC ARIA SESSION SUMMARY
Course Elapsed Days: 35
Plan Fractions Treated to Date: 25
Plan Prescribed Dose Per Fraction: 2 Gy
Plan Total Fractions Prescribed: 30
Plan Total Prescribed Dose: 60 Gy
Reference Point Dosage Given to Date: 50 Gy
Reference Point Session Dosage Given: 2 Gy
Session Number: 26

## 2023-12-02 LAB — CBC WITH DIFFERENTIAL (CANCER CENTER ONLY)
Abs Immature Granulocytes: 0.02 10*3/uL (ref 0.00–0.07)
Basophils Absolute: 0 10*3/uL (ref 0.0–0.1)
Basophils Relative: 0 %
Eosinophils Absolute: 0 10*3/uL (ref 0.0–0.5)
Eosinophils Relative: 0 %
HCT: 33.9 % — ABNORMAL LOW (ref 36.0–46.0)
Hemoglobin: 11.2 g/dL — ABNORMAL LOW (ref 12.0–15.0)
Immature Granulocytes: 1 %
Lymphocytes Relative: 16 %
Lymphs Abs: 0.5 10*3/uL — ABNORMAL LOW (ref 0.7–4.0)
MCH: 25.7 pg — ABNORMAL LOW (ref 26.0–34.0)
MCHC: 33 g/dL (ref 30.0–36.0)
MCV: 77.8 fL — ABNORMAL LOW (ref 80.0–100.0)
Monocytes Absolute: 0.2 10*3/uL (ref 0.1–1.0)
Monocytes Relative: 6 %
Neutro Abs: 2.4 10*3/uL (ref 1.7–7.7)
Neutrophils Relative %: 77 %
Platelet Count: 104 10*3/uL — ABNORMAL LOW (ref 150–400)
RBC: 4.36 MIL/uL (ref 3.87–5.11)
RDW: 15.8 % — ABNORMAL HIGH (ref 11.5–15.5)
WBC Count: 3.1 10*3/uL — ABNORMAL LOW (ref 4.0–10.5)
nRBC: 0 % (ref 0.0–0.2)

## 2023-12-02 LAB — CMP (CANCER CENTER ONLY)
ALT: 24 U/L (ref 0–44)
AST: 20 U/L (ref 15–41)
Albumin: 3.9 g/dL (ref 3.5–5.0)
Alkaline Phosphatase: 42 U/L (ref 38–126)
Anion gap: 6 (ref 5–15)
BUN: 24 mg/dL — ABNORMAL HIGH (ref 8–23)
CO2: 30 mmol/L (ref 22–32)
Calcium: 9.2 mg/dL (ref 8.9–10.3)
Chloride: 97 mmol/L — ABNORMAL LOW (ref 98–111)
Creatinine: 0.6 mg/dL (ref 0.44–1.00)
GFR, Estimated: >60 mL/min (ref >60)
Glucose, Bld: 149 mg/dL — ABNORMAL HIGH (ref 70–99)
Potassium: 4 mmol/L (ref 3.5–5.1)
Sodium: 133 mmol/L — ABNORMAL LOW (ref 135–145)
Total Bilirubin: 0.5 mg/dL (ref 0.0–1.2)
Total Protein: 6.5 g/dL (ref 6.5–8.1)

## 2023-12-02 MED ORDER — CARBOPLATIN CHEMO INJECTION 450 MG/45ML
147.6000 mg | Freq: Once | INTRAVENOUS | Status: AC
  Administered 2023-12-02: 150 mg via INTRAVENOUS
  Filled 2023-12-02: qty 14.75

## 2023-12-02 MED ORDER — DIPHENHYDRAMINE HCL 50 MG/ML IJ SOLN
50.0000 mg | Freq: Once | INTRAMUSCULAR | Status: AC
  Administered 2023-12-02: 50 mg via INTRAVENOUS
  Filled 2023-12-02: qty 1

## 2023-12-02 MED ORDER — FAMOTIDINE IN NACL 20-0.9 MG/50ML-% IV SOLN
20.0000 mg | Freq: Once | INTRAVENOUS | Status: AC
  Administered 2023-12-02: 20 mg via INTRAVENOUS
  Filled 2023-12-02: qty 50

## 2023-12-02 MED ORDER — SODIUM CHLORIDE 0.9% FLUSH
10.0000 mL | INTRAVENOUS | Status: DC | PRN
  Administered 2023-12-02: 10 mL

## 2023-12-02 MED ORDER — SODIUM CHLORIDE 0.9 % IV SOLN
45.0000 mg/m2 | Freq: Once | INTRAVENOUS | Status: AC
  Administered 2023-12-02: 72 mg via INTRAVENOUS
  Filled 2023-12-02: qty 12

## 2023-12-02 MED ORDER — DEXAMETHASONE SODIUM PHOSPHATE 10 MG/ML IJ SOLN
10.0000 mg | Freq: Once | INTRAMUSCULAR | Status: AC
  Administered 2023-12-02: 10 mg via INTRAVENOUS
  Filled 2023-12-02: qty 1

## 2023-12-02 MED ORDER — SODIUM CHLORIDE 0.9% FLUSH
10.0000 mL | Freq: Once | INTRAVENOUS | Status: AC
  Administered 2023-12-02: 10 mL

## 2023-12-02 MED ORDER — SODIUM CHLORIDE 0.9 % IV SOLN
INTRAVENOUS | Status: DC
Start: 2023-12-02 — End: 2023-12-02

## 2023-12-02 MED ORDER — HEPARIN SOD (PORK) LOCK FLUSH 100 UNIT/ML IV SOLN
500.0000 [IU] | Freq: Once | INTRAVENOUS | Status: AC | PRN
  Administered 2023-12-02: 500 [IU]

## 2023-12-02 MED ORDER — PALONOSETRON HCL INJECTION 0.25 MG/5ML
0.2500 mg | Freq: Once | INTRAVENOUS | Status: AC
  Administered 2023-12-02: 0.25 mg via INTRAVENOUS
  Filled 2023-12-02: qty 5

## 2023-12-02 MED ORDER — SODIUM CHLORIDE 0.9 % IV SOLN: INTRAVENOUS | Status: DC

## 2023-12-02 NOTE — Patient Instructions (Signed)
[facility] - A DEPT OF [facility]  Discharge Instructions: Thank you for choosing [facility] to provide your oncology and hematology care.   If you have a lab appointment with the Cancer Center, please go directly to the Cancer Center and check in at the registration area.   Wear comfortable clothing and clothing appropriate for easy access to any Portacath or PICC line.   We strive to give you quality time with your provider. You may need to reschedule your appointment if you arrive late (15 or more minutes).  Arriving late affects you and other patients whose appointments are after yours.  Also, if you miss three or more appointments without notifying the office, you may be dismissed from the clinic at the provider's discretion.      For prescription refill requests, have your pharmacy contact our office and allow 72 hours for refills to be completed.    Today you received the following chemotherapy and/or immunotherapy agents CARBOplatin (PARAPLATIN) and PACLitaxel (TAXOL)       To help prevent nausea and vomiting after your treatment, we encourage you to take your nausea medication as directed.  BELOW ARE SYMPTOMS THAT SHOULD BE REPORTED IMMEDIATELY: *FEVER GREATER THAN 100.4 F (38 C) OR HIGHER *CHILLS OR SWEATING *NAUSEA AND VOMITING THAT IS NOT CONTROLLED WITH YOUR NAUSEA MEDICATION *UNUSUAL SHORTNESS OF BREATH *UNUSUAL BRUISING OR BLEEDING *URINARY PROBLEMS (pain or burning when urinating, or frequent urination) *BOWEL PROBLEMS (unusual diarrhea, constipation, pain near the anus) TENDERNESS IN MOUTH AND THROAT WITH OR WITHOUT PRESENCE OF ULCERS (sore throat, sores in mouth, or a toothache) UNUSUAL RASH, SWELLING OR PAIN  UNUSUAL VAGINAL DISCHARGE OR ITCHING   Items with * indicate a potential emergency and should be followed up as soon as possible or go to the Emergency Department if any problems should occur.  Please show the  CHEMOTHERAPY ALERT CARD or IMMUNOTHERAPY ALERT CARD at check-in to the Emergency Department and triage nurse.  Should you have questions after your visit or need to cancel or reschedule your appointment, please contact [facility] - A DEPT OF [facility]  Dept: [phone]  and follow the prompts.  Office hours are 8:00 a.m. to 4:30 p.m. Monday - Friday. Please note that voicemails left after 4:00 p.m. may not be returned until the following business day.  We are closed weekends and major holidays. You have access to a nurse at all times for urgent questions. Please call the main number to the clinic Dept: [phone] and follow the prompts.   For any non-urgent questions, you may also contact your provider using MyChart. We now offer e-Visits for anyone 23 and older to request care online for non-urgent symptoms. For details visit [email or web address].   Also download the MyChart app! Go to the app store, search "MyChart", open the app, select [place], and log in with your MyChart username and password.

## 2023-12-03 ENCOUNTER — Other Ambulatory Visit: Payer: Self-pay

## 2023-12-03 ENCOUNTER — Other Ambulatory Visit: Payer: Self-pay | Admitting: Radiation Oncology

## 2023-12-03 ENCOUNTER — Ambulatory Visit
Admission: RE | Admit: 2023-12-03 | Discharge: 2023-12-03 | Disposition: A | Discharge status: Home / Self Care | Payer: HMO | Source: Ambulatory Visit | Attending: Radiation Oncology | Admitting: Radiation Oncology

## 2023-12-03 DIAGNOSIS — Z5111 Encounter for antineoplastic chemotherapy: Secondary | ICD-10-CM | POA: Diagnosis not present

## 2023-12-03 LAB — RAD ONC ARIA SESSION SUMMARY
Course Elapsed Days: 36
Plan Fractions Treated to Date: 26
Plan Prescribed Dose Per Fraction: 2 Gy
Plan Total Fractions Prescribed: 30
Plan Total Prescribed Dose: 60 Gy
Reference Point Dosage Given to Date: 52 Gy
Reference Point Session Dosage Given: 2 Gy
Session Number: 27

## 2023-12-03 MED ORDER — HYDROCODONE-ACETAMINOPHEN 7.5-325 MG/15ML PO SOLN: 10.0000 mL | Freq: Four times a day (QID) | ORAL | 0 refills | Status: DC | PRN

## 2023-12-04 ENCOUNTER — Other Ambulatory Visit: Payer: Self-pay

## 2023-12-04 ENCOUNTER — Other Ambulatory Visit: Payer: Medicare HMO

## 2023-12-04 ENCOUNTER — Ambulatory Visit: Payer: Medicare HMO | Admitting: Physician Assistant

## 2023-12-04 ENCOUNTER — Ambulatory Visit
Admission: RE | Admit: 2023-12-04 | Discharge: 2023-12-04 | Disposition: A | Discharge status: Home / Self Care | Payer: HMO | Source: Ambulatory Visit | Attending: Radiation Oncology | Admitting: Radiation Oncology

## 2023-12-04 DIAGNOSIS — Z5111 Encounter for antineoplastic chemotherapy: Secondary | ICD-10-CM | POA: Diagnosis not present

## 2023-12-04 LAB — RAD ONC ARIA SESSION SUMMARY
Course Elapsed Days: 37
Plan Fractions Treated to Date: 27
Plan Prescribed Dose Per Fraction: 2 Gy
Plan Total Fractions Prescribed: 30
Plan Total Prescribed Dose: 60 Gy
Reference Point Dosage Given to Date: 54 Gy
Reference Point Session Dosage Given: 2 Gy
Session Number: 28

## 2023-12-05 ENCOUNTER — Inpatient Hospital Stay (HOSPITAL_BASED_OUTPATIENT_CLINIC_OR_DEPARTMENT_OTHER): Payer: HMO | Admitting: Physician Assistant

## 2023-12-05 ENCOUNTER — Other Ambulatory Visit: Payer: Self-pay

## 2023-12-05 ENCOUNTER — Inpatient Hospital Stay: Payer: HMO

## 2023-12-05 ENCOUNTER — Telehealth: Payer: Self-pay | Admitting: *Deleted

## 2023-12-05 ENCOUNTER — Ambulatory Visit
Admission: RE | Admit: 2023-12-05 | Discharge: 2023-12-05 | Disposition: A | Discharge status: Home / Self Care | Payer: HMO | Source: Ambulatory Visit | Attending: Radiation Oncology | Admitting: Radiation Oncology

## 2023-12-05 VITALS — BP 138/72 | HR 78 | Resp 16

## 2023-12-05 VITALS — BP 138/72 | HR 78 | Temp 97.7°F | Resp 16 | Ht 60.0 in | Wt 123.2 lb

## 2023-12-05 DIAGNOSIS — K208 Other esophagitis without bleeding: Secondary | ICD-10-CM | POA: Diagnosis not present

## 2023-12-05 DIAGNOSIS — G893 Neoplasm related pain (acute) (chronic): Secondary | ICD-10-CM

## 2023-12-05 DIAGNOSIS — C342 Malignant neoplasm of middle lobe, bronchus or lung: Secondary | ICD-10-CM

## 2023-12-05 DIAGNOSIS — Z5111 Encounter for antineoplastic chemotherapy: Secondary | ICD-10-CM | POA: Diagnosis not present

## 2023-12-05 DIAGNOSIS — T66XXXA Radiation sickness, unspecified, initial encounter: Secondary | ICD-10-CM | POA: Diagnosis not present

## 2023-12-05 DIAGNOSIS — Z95828 Presence of other vascular implants and grafts: Secondary | ICD-10-CM

## 2023-12-05 LAB — CBC WITH DIFFERENTIAL (CANCER CENTER ONLY)
Abs Immature Granulocytes: 0.02 10*3/uL (ref 0.00–0.07)
Basophils Absolute: 0 10*3/uL (ref 0.0–0.1)
Basophils Relative: 0 %
Eosinophils Absolute: 0 10*3/uL (ref 0.0–0.5)
Eosinophils Relative: 0 %
HCT: 33.6 % — ABNORMAL LOW (ref 36.0–46.0)
Hemoglobin: 11.3 g/dL — ABNORMAL LOW (ref 12.0–15.0)
Immature Granulocytes: 1 %
Lymphocytes Relative: 11 %
Lymphs Abs: 0.3 10*3/uL — ABNORMAL LOW (ref 0.7–4.0)
MCH: 26.1 pg (ref 26.0–34.0)
MCHC: 33.6 g/dL (ref 30.0–36.0)
MCV: 77.6 fL — ABNORMAL LOW (ref 80.0–100.0)
Monocytes Absolute: 0.1 10*3/uL (ref 0.1–1.0)
Monocytes Relative: 4 %
Neutro Abs: 2.4 10*3/uL (ref 1.7–7.7)
Neutrophils Relative %: 84 %
Platelet Count: 86 10*3/uL — ABNORMAL LOW (ref 150–400)
RBC: 4.33 MIL/uL (ref 3.87–5.11)
RDW: 16.1 % — ABNORMAL HIGH (ref 11.5–15.5)
WBC Count: 2.8 10*3/uL — ABNORMAL LOW (ref 4.0–10.5)
nRBC: 0 % (ref 0.0–0.2)

## 2023-12-05 LAB — CMP (CANCER CENTER ONLY)
ALT: 27 U/L (ref 0–44)
AST: 22 U/L (ref 15–41)
Albumin: 3.9 g/dL (ref 3.5–5.0)
Alkaline Phosphatase: 40 U/L (ref 38–126)
Anion gap: 4 — ABNORMAL LOW (ref 5–15)
BUN: 21 mg/dL (ref 8–23)
CO2: 30 mmol/L (ref 22–32)
Calcium: 9.1 mg/dL (ref 8.9–10.3)
Chloride: 98 mmol/L (ref 98–111)
Creatinine: 0.56 mg/dL (ref 0.44–1.00)
GFR, Estimated: >60 mL/min (ref >60)
Glucose, Bld: 141 mg/dL — ABNORMAL HIGH (ref 70–99)
Potassium: 4 mmol/L (ref 3.5–5.1)
Sodium: 132 mmol/L — ABNORMAL LOW (ref 135–145)
Total Bilirubin: 0.6 mg/dL (ref 0.0–1.2)
Total Protein: 6.6 g/dL (ref 6.5–8.1)

## 2023-12-05 LAB — RAD ONC ARIA SESSION SUMMARY
Course Elapsed Days: 38
Plan Fractions Treated to Date: 28
Plan Prescribed Dose Per Fraction: 2 Gy
Plan Total Fractions Prescribed: 30
Plan Total Prescribed Dose: 60 Gy
Reference Point Dosage Given to Date: 56 Gy
Reference Point Session Dosage Given: 2 Gy
Session Number: 29

## 2023-12-05 LAB — MAGNESIUM: Magnesium: 1.8 mg/dL (ref 1.7–2.4)

## 2023-12-05 MED ORDER — SODIUM CHLORIDE 0.9 % IV SOLN: Freq: Once | INTRAVENOUS | Status: AC

## 2023-12-05 MED ORDER — HEPARIN SOD (PORK) LOCK FLUSH 100 UNIT/ML IV SOLN
500.0000 [IU] | Freq: Once | INTRAVENOUS | Status: AC
  Administered 2023-12-05: 500 [IU] via INTRAVENOUS

## 2023-12-05 MED ORDER — MORPHINE SULFATE (PF) 2 MG/ML IV SOLN
2.0000 mg | Freq: Once | INTRAVENOUS | Status: AC
  Administered 2023-12-05: 2 mg via INTRAVENOUS
  Filled 2023-12-05: qty 1

## 2023-12-05 MED ORDER — ONDANSETRON HCL 4 MG/2ML IJ SOLN
4.0000 mg | Freq: Once | INTRAMUSCULAR | Status: AC
  Administered 2023-12-05: 4 mg via INTRAVENOUS
  Filled 2023-12-05: qty 2

## 2023-12-05 MED ORDER — SODIUM CHLORIDE 0.9% FLUSH
10.0000 mL | Freq: Once | INTRAVENOUS | Status: AC
  Administered 2023-12-05: 10 mL via INTRAVENOUS

## 2023-12-05 MED ORDER — FAMOTIDINE IN NACL 20-0.9 MG/50ML-% IV SOLN
20.0000 mg | Freq: Once | INTRAVENOUS | Status: AC
  Administered 2023-12-05: 20 mg via INTRAVENOUS
  Filled 2023-12-05: qty 50

## 2023-12-05 MED ORDER — SODIUM CHLORIDE 0.9% FLUSH
10.0000 mL | Freq: Once | INTRAVENOUS | Status: AC
  Administered 2023-12-05: 10 mL

## 2023-12-05 NOTE — Progress Notes (Signed)
 Symptom Management Consult Note [facility]    Patient Care Team: Ransom Other, MD as PCP - General (Internal Medicine) Kristie Lamprey, MD as PCP - Gastroenterology (Gastroenterology) Tobb, Kardie, DO as PCP - Cardiology (Cardiology) Aron Shoulders, MD as Consulting Physician (General Surgery) Prentis Duwaine BROCKS, RN as Oncology Nurse Navigator    Name / MRN / DOB: Ashley Pratt  [id number]  1951-10-01   Date of visit: 12/05/2023   Chief Complaint/Reason for visit: weakness, decreased appetite   Current Therapy: Concurrent chemoradiation with weekly carboplatin  for AUC of 2 and paclitaxel  45 Mg/M2   Last treatment:  Day 1   Cycle 6 on 12/02/23   ASSESSMENT & PLAN: Patient is a 73 y.o. female with oncologic history of stage IIIb (T1c, N3, M0) non-small cell lung cancer, adenocarcinoma followed by Dr. Sherrod.  I have viewed most recent oncology note and lab work.    #Stage IIIb (T1c, N3, M0) non-small cell lung cancer, adenocarcinoma  - Next appointment with oncologist is 12/10/23 - Per spouse patient planning to stop radiation tomorrow after 30 treatments as her symptoms are severe and hard to manage.They will discuss with Dr. Dewey in more detail. -Patient will discuss if future chemotherapy appointments are needed at upcoming appointment as well.   #Lung Cancer with Radiation Esophagitis -Severe pain with swallowing, decreased oral intake, and weakness. No oral sores. Pain partially relieved with hydrocodone  and acetaminophen . -Will administer IV fluids for suspected dehydration, IV famotidine  for additional gastric protection. Also, will administer low-dose IV morphine  for pain control. with an antiemetic to prevent nausea from pain medication. -Kidney function and electrolytes are normal today. - Encouraged resuming Carafate . Discussed pain management and prescribing viscous lidocaine  for pain. Patient prefers to hold off on this and will increase her  baking soda rinse frequency.  #Pancytopenia -Related to treatment. -No active bleeding or infectious symptoms. Discussed bleeding and neutropenic precautions.  Strict ED precautions discussed should symptoms worsen.   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 This measures 2.1 x 0.7 x 2.2 cm.  At the  distal end of this hypoechoic area, there is another hypoechoic  area that may contain calcifications, 6 cm from the right nipple at  11 o'clock measuring 9 x 4 x 6 mm.  Findings are concerning for  possible carcinoma.    03/10/2013 Initial Biopsy   Diagnosis Breast, right, needle core biopsy, 9:30, 8cm/nipple - LOBULAR CARCINOMA IN SITU WITH NECROSIS AND CALCIFICATION, SEE COMMENT. Microscopic Comment There is extensive lobular carcinoma in situ, pleomorphic type, present with associated necrosis and calcification. The presence of the myoepithelial layer was confirmed with p63, smooth muscle mycin heavy chain, and calponin immunostains. The case was reviewed with Dr. Ezell who concurs. (CRR:caf 03/12/13)   05/11/2013 Surgery   RIGHT BREAST NEEDLE LOCALIZATION  LUMPECTOMY by Dr Aron    05/11/2013 Pathology Results   Diagnosis Breast, lumpectomy, Right - LOBULAR CARCINOMA IN SITU WITH CALCIFICATIONS, PARTIALLY INVOLVING AN INTRADUCTAL PAPILLOMA. - FIBROCYSTIC CHANGES  WITH CALCIFICATIONS. - HEALING BIOPSY SITE. - SEE COMMENT. Microscopic Comment Immunohistochemical stains for  smooth muscle myosin, calponin, p63 and cytokeratin AE1/AE3 fail to highlight the presence of invasive carcinoma. A cytokeratin 5/6 stain is negative for the presence of atypical ductal hyperplasia. The surgical resection margin(s) of the specimen were inked and microscopically evaluated. (JBK:caf 05/14/13)   07/18/2013 Imaging   DEXA  Osteopenia with Lowest T-score -1.7 at [facility]    06/2013 - 03/2016 Anti-estrogen oral therapy   She tried Tamoxifen  and Exemestane . She was Intolerant to tamoxifen  initially. Cumulative stiffness/ arthralgias from aromasin  already resolved. She stopped on her own.    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 This patient is considered osteopenic according to World Health Organization [facility]) criteria. There has been a statistically significant increase in BMD of Lumbar spine and no statistically significant change in left hip since prior exam dated 07/28/2013.   05/09/2020 Mammogram   There are developing grouped calcifications in the anterior third of the upper inner quadrant of the right breast spanning an area of 5 mm. They are indeterminate.    05/13/2020 Relapse/Recurrence   Diagnosis Breast, right, needle core biopsy, UIQ - MAMMARY CARCINOMA IN-SITU WITH NECROSIS AND CALCIFICATIONS - SEE COMMENT Microscopic Comment There is a focus concerning but not definitive for microinvasion. Based on the biopsy, the carcinoma in situ has a pleomorphic pattern, high nuclear grade and measures 0.4 cm in greatest linear extent. E-cadherin is pending and will be reported in an addendum. Dr. Jodene reviewed the case and agrees with the above diagnosis. These results were called to [facility] on May 16, 2020.   08/02/2020 Surgery   RIGHT  BREAST LUMPECTOMY WITH RADIOACTIVE SEED LOCALIZATION by Dr Aron    08/02/2020 Pathology Results   FINAL MICROSCOPIC DIAGNOSIS:   A. BREAST, RIGHT, LUMPECTOMY:  - Biopsy site.  Lobular carcinoma in situ.  Usual duct epithelial  hyperplasia.  Fibrocystic change.   B. BREAST, RIGHT, ADDITIONAL POSTERIOR MARGIN, EXCISION:  - Usual duct epithelial hyperplasia.  Fibrocystic change.   COMMENT:   A. The lobular carcinoma in situ observed is not of the pleomorphic  type.   B. E-cadherin and CK 5/6 are positive in usual duct epithelial  hyperplasia.    08/2020 -  Anti-estrogen oral therapy   Tamoxifen  10mg  once daily starting in 08/2020   Malignant neoplasm of middle lobe of right lung (HCC)  10/10/2023 Initial Diagnosis   Malignant neoplasm of middle lobe of right lung (HCC)   10/14/2023 Cancer Staging   Staging form: Lung, AJCC 8th Edition - Clinical: Stage IIIB (cT1c, cN3, cM0) - Signed by Sherrod Sherrod, MD on 10/18/2023 Stage prefix: Initial diagnosis   10/29/2023 -  Chemotherapy   Patient is on Treatment Plan : LUNG Carboplatin  + Paclitaxel  + XRT q7d         Interval history-: Discussed the use of AI scribe software for clinical note transcription with the patient, who gave verbal consent to proceed.   Donyel Castagnola is a 73 y.o. female with oncologic history as above presenting to [facility] today with chief complaint of weakness and decreased appetite. Patient is accompanied by her spouse who provides additional history.  Patient reports generalized weakness and pain when swallowing. For nearly three  weeks, she has been able to consume only one to two small cups of water daily and a few sips of Boost. No fever or significant cough.  No oral sores are present. She is currently on a regimen of daily radiation therapy, with tomorrow marking the 30th day, and has completed six chemotherapy sessions. These treatments have been extremely taxing, and she is considering ending  radiation because of the severity of her symptoms.  She experiences odynophagia with pain levels reaching 8 out of 10, describing a burning sensation and a feeling of food and liquid getting stuck in her throat and back. Her spouse tries to alleviate discomfort by rubbing her back while drinking. She has been taking liquid hydrocodone  and acetaminophen  for pain, which provides partial relief, and uses famotidine , which has not been overly effective. She has not been able to tolerate Carafate  secondary to her pain. Despite her symptoms, she attempts to walk for 20 to 30 minutes when possible. denies fever or chills, emesis.   ROS  All other systems are reviewed and are negative for acute change except as noted in the HPI.    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)    unspecified site of breast   Osteoarthritis    of the knee left worse than right   Prediabetes      Past Surgical History:  Procedure Laterality Date   BREAST EXCISIONAL BIOPSY Right 2014   BREAST LUMPECTOMY Right 05/11/2013   high risk lumpectomy   BREAST LUMPECTOMY Right 2021   LCIS   BREAST LUMPECTOMY WITH NEEDLE LOCALIZATION Right 05/11/2013   Procedure: RIGHT BREAST NEEDLE LOCALIZATION  LUMPECTOMY;  Surgeon: Jina Nephew, MD;  Location: [facility];  Service: General;  Laterality: Right;   BREAST LUMPECTOMY WITH RADIOACTIVE SEED LOCALIZATION Right 08/02/2020   Procedure: RIGHT BREAST LUMPECTOMY WITH RADIOACTIVE SEED LOCALIZATION;  Surgeon: Nephew Jina, MD;  Location: [name];  Service: General;  Laterality: Right;  RNFA   BRONCHIAL BIOPSY  10/07/2023   Procedure: BRONCHIAL BIOPSIES;  Surgeon: Shelah Lamar RAMAN, MD;  Location: [facility];  Service: Pulmonary;;   BRONCHIAL BRUSHINGS  10/07/2023   Procedure: BRONCHIAL BRUSHINGS;  Surgeon: Shelah Lamar RAMAN, MD;  Location: [facility];  Service: Pulmonary;;   BRONCHIAL NEEDLE ASPIRATION BIOPSY  10/07/2023   Procedure: BRONCHIAL NEEDLE ASPIRATION BIOPSIES;  Surgeon: Shelah Lamar RAMAN, MD;  Location: [facility];  Service: Pulmonary;;   CHOLECYSTECTOMY     COLONOSCOPY     FOOT OSTEOTOMY     both  feet   IR IMAGING GUIDED PORT INSERTION  11/07/2023   TONSILLECTOMY     VIDEO BRONCHOSCOPY WITH ENDOBRONCHIAL ULTRASOUND N/A 10/07/2023   Procedure: VIDEO BRONCHOSCOPY WITH ENDOBRONCHIAL ULTRASOUND;  Surgeon: Shelah Lamar RAMAN, MD;  Location: [facility];  Service: Pulmonary;  Laterality: N/A;    Social History   Socioeconomic History   Marital status: Married    Spouse name:  Not on file   Number of children: 2   Years of education: Not on file   Highest education level: Not on file  Occupational History   Not on file  Tobacco Use   Smoking status: Never   Smokeless tobacco: Never  Substance and Sexual Activity   Alcohol use: No   Drug use: No   Sexual activity: Yes  Other Topics Concern   Not on file  Social History Narrative   Lives with husband.     Social Drivers of Corporate Investment Banker Strain: Not on file  Food Insecurity: Low Risk  (09/23/2023)   Received from [facility]   Hunger Vital Sign    Worried About Running Out of Food in the Last Year: Never true    Ran Out of Food in the Last Year: Never true  Transportation Needs: No Transportation Needs (09/23/2023)   Received from [facility]    In the past 12 months, has lack of reliable transportation kept you from medical appointments, meetings, work or from getting things needed for daily living? : No   Physical Activity: Not on file  Stress: Not on file  Social Connections: Not on file  Intimate Partner Violence: Not on file    Family History  Problem Relation Age of Onset   Hypertension Mother    Stroke Mother    Hypertension Sister    Hypertension Brother      Current Outpatient Medications:    eszopiclone  (LUNESTA ) 2 MG TABS tablet, Take 1 tablet (2 mg total) by mouth at bedtime as needed for sleep. Take immediately before bedtime, Disp: 20 tablet, Rfl: 3   famotidine  (ZANTAC 360 MAX ST) 20 MG tablet, Take 1 tablet (20 mg total) by mouth 2 (two) times daily., Disp: 60 tablet, Rfl: 1   HYDROcodone -acetaminophen  (HYCET) 7.5-325 mg/15 ml solution, Take 10 mLs by mouth every 6 (six) hours as needed for moderate pain (pain score 4-6)., Disp: 300 mL, Rfl: 0   lidocaine -prilocaine  (EMLA ) cream, Apply 1 Application topically as needed., Disp: 30 g, Rfl: 2   losartan  (COZAAR ) 100 MG tablet, Take 100 mg by mouth daily., Disp: , Rfl:    magnesium gluconate (MAGONATE) 500 MG tablet, Take 500 mg by mouth 2 (two) times daily., Disp: , Rfl:    Multiple Vitamin (MULTIVITAMIN) capsule, Take 1 capsule by mouth daily., Disp: , Rfl:    ondansetron  (ZOFRAN ) 8 MG tablet, Take 1 tablet (8 mg total) by mouth every 8 (eight) hours as needed for nausea or vomiting. Start on the third day after chemotherapy., Disp: 30 tablet, Rfl: 1   pantoprazole (PROTONIX) 20 MG tablet, Take 20 mg by mouth daily., Disp: , Rfl:    prochlorperazine  (COMPAZINE ) 10 MG tablet, Take 1 tablet (10 mg total) by mouth every 6 (six) hours as needed for nausea or vomiting., Disp: 30 tablet, Rfl: 1   sucralfate  (CARAFATE ) 1 g tablet, Take 1 tablet (1 g total) by mouth 4 (four) times daily. Dissolve each tablet in 15 cc water before use., Disp: 120 tablet, Rfl: 2   sucralfate  (CARAFATE ) 1 g tablet, Take 1 tablet (1 g total) by mouth 4 (four) times daily as needed. Take 30 minutes before mealtimes, and before bed. Mix with 15 cc's of  water., Disp: 120 tablet, Rfl: 1  PHYSICAL EXAM: ECOG FS:1 - Symptomatic but completely ambulatory    Vitals:   12/05/23 1400  BP: 138/72  Pulse: 78  Resp: 16  SpO2: 100%   Physical Exam Vitals and nursing note reviewed.  Constitutional:      Appearance: She is not ill-appearing or toxic-appearing.  HENT:     Head: Normocephalic.     Mouth/Throat:     Pharynx: No posterior oropharyngeal erythema.     Comments: No oral lesions or signs of mucositis. Eyes:     Conjunctiva/sclera: Conjunctivae normal.  Cardiovascular:     Rate and Rhythm: Normal rate and regular rhythm.     Pulses: Normal pulses.     Heart sounds: Normal heart sounds.  Pulmonary:     Effort: Pulmonary effort is normal.     Breath sounds: Normal breath sounds.  Abdominal:     General: There is no distension.  Musculoskeletal:     Cervical back: Normal range of motion.  Skin:    General: Skin is warm and dry.  Neurological:     Mental Status: She is alert.        LABORATORY DATA: I have reviewed the data as listed    Latest Ref Rng & Units 12/05/2023   11:05 AM 12/02/2023    9:39 AM 11/25/2023   10:50 AM  CBC  WBC 4.0 - 10.5 K/uL 2.8  3.1  2.9   Hemoglobin 12.0 - 15.0 g/dL 88.6  88.7  88.9   Hematocrit 36.0 - 46.0 % 33.6  33.9  32.9   Platelets 150 - 400 K/uL 86  104  149         Latest Ref Rng & Units 12/05/2023   11:05 AM 12/02/2023    9:39 AM 11/25/2023   10:50 AM  CMP  Glucose 70 - 99 mg/dL 858  850  869   BUN 8 - 23 mg/dL 21  24  19    Creatinine 0.44 - 1.00 mg/dL 9.43  9.39  9.31   Sodium 135 - 145 mmol/L 132  133  129   Potassium 3.5 - 5.1 mmol/L 4.0  4.0  3.8   Chloride 98 - 111 mmol/L 98  97  97   CO2 22 - 32 mmol/L 30  30  28    Calcium 8.9 - 10.3 mg/dL 9.1  9.2  9.1   Total Protein 6.5 - 8.1 g/dL 6.6  6.5  6.4   Total Bilirubin 0.0 - 1.2 mg/dL 0.6  0.5  0.5   Alkaline Phos 38 - 126 U/L 40  42  41   AST 15 - 41 U/L 22  20  19    ALT 0 - 44 U/L 27  24  26         RADIOGRAPHIC  STUDIES (from last 24 hours if applicable) I have personally reviewed the radiological images as listed and agreed with the findings in the report. No results found.      Visit Diagnosis: 1. Neoplasm related pain   2. Malignant neoplasm of middle lobe of right lung (HCC)   3. Radiation-induced esophagitis      No orders of the defined types were placed in this encounter.   All questions were answered. The patient knows to call the clinic with any problems, questions or concerns. No barriers to learning was detected.  A total of more than 30 minutes were spent on this encounter with face-to-face time and non-face-to-face time, including preparing to see the patient, ordering tests and/or medications, counseling the patient and coordination of care as outlined above.    Thank you for allowing me to participate in the care of this patient.  Garyn Arlotta E  Walisiewicz, PA-C Department of Hematology/Oncology [facility] Phone: [phone]  [phone]    12/05/2023 2:32 PM

## 2023-12-05 NOTE — Telephone Encounter (Signed)
 Ashley Pratt states she is very weak, not eating, not able to drink, vomited this morning and seems to be constipated. Message to [facility] for possible evaluation today. Will be seen after RT

## 2023-12-05 NOTE — Patient Instructions (Signed)
 Rehydration, Older Adult  Rehydration is the replacement of fluids, salts, and minerals in the body (electrolytes) that are lost during dehydration. Dehydration is when there is not enough water or other fluids in the body. This happens when you lose more fluids than you take in. People who are age 72 or older have a higher risk of dehydration than younger adults. This is because in older age, the body: Is less able to maintain the right amount of water. Does not respond to temperature changes as well. Does not get a sense of thirst as easily or quickly. Other causes include: Not drinking enough fluids. This can occur when you are ill, when you forget to drink, or when you are doing activities that require a lot of energy, especially in hot weather. Conditions that cause loss of water or other fluids. These include diarrhea, vomiting, sweating, or urinating a lot. Other illnesses, such as fever or infection. Certain medicines, such as those that remove excess fluid from the body (diuretics). Symptoms of mild or moderate dehydration may include thirst, dry lips and mouth, and dizziness. Symptoms of severe dehydration may include increased heart rate, confusion, fainting, and not urinating. In severe cases, you may need to get fluids through an IV at the hospital. For mild or moderate cases, you can usually rehydrate at home by drinking certain fluids as told by your health care provider. What are the risks? Rehydration is usually safe. Taking in too much fluid (overhydration) can be a problem but is rare. Overhydration can cause an imbalance of electrolytes in the body, kidney failure, fluid in the lungs, or a decrease in salt (sodium) levels in the body. Supplies needed: You will need an oral rehydration solution (ORS) if your health care provider tells you to use one. This is a drink to treat dehydration. It can be found in pharmacies and retail stores. How to rehydrate Fluids Follow  instructions from your health care provider about what to drink. The kind of fluid and the amount you should drink depend on your condition. In general, you should choose drinks that you prefer. If told by your health care provider, drink an ORS. Make an ORS by following instructions on the package. Start by drinking small amounts, about  cup (120 mL) every 5-10 minutes. Slowly increase how much you drink until you have taken in the amount recommended by your health care provider. Drink enough clear fluids to keep your urine pale yellow. If you were told to drink an ORS, finish it first, then start slowly drinking other clear fluids. Drink fluids such as: Water. This includes sparkling and flavored water. Drinking only water can lead to having too little sodium in your body (hyponatremia). Follow the advice of your health care provider. Water from ice chips you suck on. Fruit juice with water added to it(diluted). Sports drinks. Hot or cold herbal teas. Broth-based soups. Coffee. Milk or milk products. Food Follow instructions from your health care provider about what to eat while you rehydrate. Your health care provider may recommend that you slowly begin eating regular foods in small amounts. Eat foods that contain a healthy balance of electrolytes, such as bananas, oranges, potatoes, tomatoes, and spinach. Avoid foods that are greasy or contain a lot of sugar. In some cases, you may get nutrition through a feeding tube that is passed through your nose and into your stomach (nasogastric tube, or NG tube). This may be done if you have uncontrolled vomiting or diarrhea. Drinks to avoid  Certain drinks may make dehydration worse. While you rehydrate, avoid drinking alcohol. How to tell if you are recovering from dehydration You may be getting better if: You are urinating more often than before you started rehydrating. Your urine is pale yellow. Your energy level improves. You vomit less  often. You have diarrhea less often. Your appetite improves or returns to normal. You feel less dizzy or light-headed. Your skin tone and color start to look more normal. Follow these instructions at home: Take over-the-counter and prescription medicines only as told by your health care provider. Do not take sodium tablets. Doing this can lead to having too much sodium in your body (hypernatremia). Contact a health care provider if: You continue to have symptoms of mild or moderate dehydration, such as: Thirst. Dry lips. Slightly dry mouth. Dizziness. Dark urine or less urine than usual. Muscle cramps. You continue to vomit or have diarrhea. Get help right away if: You have symptoms of dehydration that get worse. You have a fever. You have a severe headache. You have been vomiting and have problems, such as: Your vomiting gets worse. Your vomit includes blood or green matter (bile). You cannot eat or drink without vomiting. You have problems with urination or bowel movements, such as: Diarrhea that gets worse. Blood in your stool (feces). This may cause stool to look black and tarry. Not urinating, or urinating only a small amount of very dark urine, within 6-8 hours. You have trouble breathing. You have symptoms that get worse with treatment. These symptoms may be an emergency. Get help right away. Call 911. Do not wait to see if the symptoms will go away. Do not drive yourself to the hospital. This information is not intended to replace advice given to you by your health care provider. Make sure you discuss any questions you have with your health care provider. Dehydration, Adult Dehydration is a condition in which there is not enough water or other fluids in the body. This happens when a person loses more fluids than they take in. Important organs cannot work right without the right amount of fluids. Any loss of fluids from the body can cause dehydration. Dehydration can be  mild, worse, or very bad. It should be treated right away to keep it from getting very bad. What are the causes? Conditions that cause loss of water in the body. They include: Watery poop (diarrhea). Vomiting. Sweating a lot. Fever. Infection. Peeing (urinating) a lot. Not drinking enough fluids. Certain medicines, such as medicines that take extra fluid out of the body (diuretics). Lack of safe drinking water. Not being able to get enough water and food. What increases the risk? Having a long-term (chronic) illness that has not been treated the right way, such as: Diabetes. Heart disease. Kidney disease. Being 40 years of age or older. Having a disability. Living in a place that is high above the ground or sea (high in altitude). The thinner, drier air causes more fluid loss. Doing exercises that put stress on your body for a long time. Being active when in hot places. What are the signs or symptoms? Symptoms of dehydration depend on how bad it is. Mild or worse dehydration Thirst. Dry lips or dry mouth. Feeling dizzy or light-headed. Muscle cramps. Passing little pee or dark pee. Pee may be the color of tea. Headache. Very bad dehydration Changes in skin. Skin may: Be cold to the touch (clammy). Be blotchy or pale. Not go back to normal right after you  pinch it and let it go. Little or no tears, pee, or sweat. Fast breathing. Low blood pressure. Weak pulse. Pulse that is more than 100 beats a minute when you are sitting still. Other changes, such as: Feeling very thirsty. Eyes that look hollow (sunken). Cold hands and feet. Being confused. Being very tired (lethargic) or having trouble waking from sleep. Losing weight. Loss of consciousness. How is this treated? Treatment for this condition depends on how bad your dehydration is. Treatment should start right away. Do not wait until your condition gets very bad. Very bad dehydration is an emergency. You will need  to go to a hospital. Mild or worse dehydration can be treated at home. You may be asked to: Drink more fluids. Drink an oral rehydration solution (ORS). This drink gives you the right amount of fluids, salts, and minerals (electrolytes). Very bad dehydration can be treated: With fluids through an IV tube. By correcting low levels of electrolytes in the body. By treating the problem that caused your dehydration. Follow these instructions at home: Oral rehydration solution If told by your doctor, drink an ORS: Make an ORS. Use instructions on the package. Start by drinking small amounts, about  cup (120 mL) every 5-10 minutes. Slowly drink more until you have had the amount that your doctor said to have.  Eating and drinking  Drink enough clear fluid to keep your pee pale yellow. If you were told to drink an ORS, finish the ORS first. Then, start slowly drinking other clear fluids. Drink fluids such as: Water. Do not drink only water. Doing that can make the salt (sodium) level in your body get too low. Water from ice chips you suck on. Fruit juice that you have added water to (diluted). Low-calorie sports drinks. Eat foods that have the right amounts of salts and minerals, such as bananas, oranges, potatoes, tomatoes, or spinach. Do not drink alcohol. Avoid drinks that have caffeine or sugar. These include:: High-calorie sports drinks. Fruit juice that you did not add water to. Soda. Coffee or energy drinks. Avoid foods that are greasy or have a lot of fat or sugar. General instructions Take over-the-counter and prescription medicines only as told by your doctor. Do not take sodium tablets. Doing that can make the salt level in your body get too high. Return to your normal activities as told by your doctor. Ask your doctor what activities are safe for you. Keep all follow-up visits. Your doctor may check and change your treatment. Contact a doctor if: You have pain in your belly  (abdomen) and the pain: Gets worse. Stays in one place. You have a rash. You have a stiff neck. You get angry or annoyed more easily than normal. You are more tired or have a harder time waking than normal. You feel weak or dizzy. You feel very thirsty. Get help right away if: You have any symptoms of very bad dehydration. You vomit every time you eat or drink. Your vomiting gets worse, does not go away, or you vomit blood or green stuff. You are getting treatment, but symptoms are getting worse. You have a fever. You have a very bad headache. You have: Diarrhea that gets worse or does not go away. Blood in your poop (stool). This may cause poop to look black and tarry. No pee in 6-8 hours. Only a small amount of pee in 6-8 hours, and the pee is very dark. You have trouble breathing. These symptoms may be an emergency. Get  help right away. Call 911. Do not wait to see if the symptoms will go away. Do not drive yourself to the hospital. This information is not intended to replace advice given to you by your health care provider. Make sure you discuss any questions you have with your health care provider. Document Revised: 05/14/2022 Document Reviewed: 05/14/2022 Elsevier Patient Education  2024 [employer]. Document Revised: 02/28/2022 Document Reviewed: 02/26/2022 Elsevier Patient Education  2024 [employer].

## 2023-12-06 ENCOUNTER — Ambulatory Visit: Payer: HMO

## 2023-12-06 ENCOUNTER — Other Ambulatory Visit: Payer: Self-pay

## 2023-12-06 ENCOUNTER — Ambulatory Visit
Admission: RE | Admit: 2023-12-06 | Discharge: 2023-12-06 | Disposition: A | Discharge status: Home / Self Care | Payer: HMO | Source: Ambulatory Visit | Attending: Radiation Oncology | Admitting: Radiation Oncology

## 2023-12-06 DIAGNOSIS — Z5111 Encounter for antineoplastic chemotherapy: Secondary | ICD-10-CM | POA: Diagnosis not present

## 2023-12-06 LAB — RAD ONC ARIA SESSION SUMMARY
Course Elapsed Days: 39
Plan Fractions Treated to Date: 29
Plan Prescribed Dose Per Fraction: 2 Gy
Plan Total Fractions Prescribed: 30
Plan Total Prescribed Dose: 60 Gy
Reference Point Dosage Given to Date: 58 Gy
Reference Point Session Dosage Given: 2 Gy
Session Number: 30

## 2023-12-09 ENCOUNTER — Other Ambulatory Visit: Payer: Self-pay

## 2023-12-09 ENCOUNTER — Ambulatory Visit
Admission: RE | Admit: 2023-12-09 | Discharge: 2023-12-09 | Disposition: A | Discharge status: Home / Self Care | Payer: HMO | Source: Ambulatory Visit | Attending: Radiation Oncology | Admitting: Radiation Oncology

## 2023-12-09 ENCOUNTER — Inpatient Hospital Stay: Payer: HMO

## 2023-12-09 ENCOUNTER — Ambulatory Visit: Payer: HMO

## 2023-12-09 DIAGNOSIS — Z5111 Encounter for antineoplastic chemotherapy: Secondary | ICD-10-CM | POA: Diagnosis not present

## 2023-12-09 DIAGNOSIS — Z95828 Presence of other vascular implants and grafts: Secondary | ICD-10-CM

## 2023-12-09 LAB — CBC WITH DIFFERENTIAL (CANCER CENTER ONLY)
Abs Immature Granulocytes: 0 10*3/uL (ref 0.00–0.07)
Basophils Absolute: 0 10*3/uL (ref 0.0–0.1)
Basophils Relative: 0 %
Eosinophils Absolute: 0 10*3/uL (ref 0.0–0.5)
Eosinophils Relative: 0 %
HCT: 32.6 % — ABNORMAL LOW (ref 36.0–46.0)
Hemoglobin: 10.9 g/dL — ABNORMAL LOW (ref 12.0–15.0)
Immature Granulocytes: 0 %
Lymphocytes Relative: 19 %
Lymphs Abs: 0.4 10*3/uL — ABNORMAL LOW (ref 0.7–4.0)
MCH: 26.2 pg (ref 26.0–34.0)
MCHC: 33.4 g/dL (ref 30.0–36.0)
MCV: 78.4 fL — ABNORMAL LOW (ref 80.0–100.0)
Monocytes Absolute: 0.1 10*3/uL (ref 0.1–1.0)
Monocytes Relative: 6 %
Neutro Abs: 1.7 10*3/uL (ref 1.7–7.7)
Neutrophils Relative %: 75 %
Platelet Count: 119 10*3/uL — ABNORMAL LOW (ref 150–400)
RBC: 4.16 MIL/uL (ref 3.87–5.11)
RDW: 16.3 % — ABNORMAL HIGH (ref 11.5–15.5)
WBC Count: 2.2 10*3/uL — ABNORMAL LOW (ref 4.0–10.5)
nRBC: 0 % (ref 0.0–0.2)

## 2023-12-09 LAB — RAD ONC ARIA SESSION SUMMARY
Course Elapsed Days: 42
Plan Fractions Treated to Date: 30
Plan Prescribed Dose Per Fraction: 2 Gy
Plan Total Fractions Prescribed: 30
Plan Total Prescribed Dose: 60 Gy
Reference Point Dosage Given to Date: 60 Gy
Reference Point Session Dosage Given: 2 Gy
Session Number: 31

## 2023-12-09 LAB — CMP (CANCER CENTER ONLY)
ALT: 26 U/L (ref 0–44)
AST: 19 U/L (ref 15–41)
Albumin: 3.7 g/dL (ref 3.5–5.0)
Alkaline Phosphatase: 45 U/L (ref 38–126)
Anion gap: 6 (ref 5–15)
BUN: 18 mg/dL (ref 8–23)
CO2: 29 mmol/L (ref 22–32)
Calcium: 9.2 mg/dL (ref 8.9–10.3)
Chloride: 98 mmol/L (ref 98–111)
Creatinine: 0.62 mg/dL (ref 0.44–1.00)
GFR, Estimated: >60 mL/min (ref >60)
Glucose, Bld: 113 mg/dL — ABNORMAL HIGH (ref 70–99)
Potassium: 4 mmol/L (ref 3.5–5.1)
Sodium: 133 mmol/L — ABNORMAL LOW (ref 135–145)
Total Bilirubin: 0.6 mg/dL (ref 0.0–1.2)
Total Protein: 6.3 g/dL — ABNORMAL LOW (ref 6.5–8.1)

## 2023-12-09 MED ORDER — HEPARIN SOD (PORK) LOCK FLUSH 100 UNIT/ML IV SOLN
500.0000 [IU] | Freq: Once | INTRAVENOUS | Status: AC
  Administered 2023-12-09: 500 [IU]

## 2023-12-09 MED ORDER — SODIUM CHLORIDE 0.9% FLUSH
10.0000 mL | Freq: Once | INTRAVENOUS | Status: AC
  Administered 2023-12-09: 10 mL

## 2023-12-10 ENCOUNTER — Encounter: Payer: Self-pay | Admitting: Medical Oncology

## 2023-12-10 ENCOUNTER — Inpatient Hospital Stay: Payer: HMO

## 2023-12-10 ENCOUNTER — Ambulatory Visit: Payer: HMO

## 2023-12-10 ENCOUNTER — Other Ambulatory Visit: Payer: Medicare HMO

## 2023-12-10 ENCOUNTER — Inpatient Hospital Stay (HOSPITAL_BASED_OUTPATIENT_CLINIC_OR_DEPARTMENT_OTHER): Payer: HMO | Admitting: Internal Medicine

## 2023-12-10 ENCOUNTER — Ambulatory Visit: Payer: HMO | Admitting: Radiation Oncology

## 2023-12-10 ENCOUNTER — Inpatient Hospital Stay: Payer: HMO | Admitting: Dietician

## 2023-12-10 VITALS — BP 148/100 | HR 115 | Temp 99.1°F | Resp 16 | Wt 120.0 lb

## 2023-12-10 DIAGNOSIS — C342 Malignant neoplasm of middle lobe, bronchus or lung: Secondary | ICD-10-CM

## 2023-12-10 DIAGNOSIS — C3490 Malignant neoplasm of unspecified part of unspecified bronchus or lung: Secondary | ICD-10-CM | POA: Diagnosis not present

## 2023-12-10 DIAGNOSIS — Z5111 Encounter for antineoplastic chemotherapy: Secondary | ICD-10-CM | POA: Diagnosis not present

## 2023-12-10 MED ORDER — SODIUM CHLORIDE 0.9 % IV SOLN: INTRAVENOUS | Status: AC

## 2023-12-10 NOTE — Progress Notes (Signed)
[facility] [phone]   [phone]  OFFICE PROGRESS NOTE  Georgann Housekeeper, MD [address] [place] [address]  DIAGNOSIS:  stage IIIb (T1c, N3, M0) non-small cell lung cancer, adenocarcinoma presented with right middle lobe lung nodule in addition to bilateral hilar and mediastinal lymphadenopathy diagnosed in December 2024.   Biomarker Findings HRD signature - HRDsig Negative Microsatellite status - MS-Stable Tumor Mutational Burden - 2 Muts/Mb Genomic Findings For a complete list of the genes assayed, please refer to the Appendix. EGFR G719C, E709V, amplification CDKN2A loss - equivocal? MTAP loss - equivocal? CDKN2B loss - equivocal? FGF23 R187W - subclonal? TP53 R248L 7 Disease relevant genes with no reportable alterations: ALK, BRAF, ERBB2, KRAS, MET, RET, ROS1  PDL1 Expression 30%   PRIOR THERAPY: None  CURRENT THERAPY: Concurrent chemoradiation with weekly carboplatin for AUC of 2 and paclitaxel 45 Mg/M2.  First dose October 29, 2023.  Status post 6 cycles.   INTERVAL HISTORY: Ashley Pratt 73 y.o. female returns to the clinic today for follow-up visit accompanied by her husband. Discussed the use of AI scribe software for clinical note transcription with the patient, who gave verbal consent to proceed.  History of Present Illness   Ashley Pratt is a 73 year old female with stage 3B non-small cell lung cancer who presents with a burning sensation in the chest after completing radiation therapy.  She was diagnosed with stage 3B non-small cell lung cancer, specifically adenocarcinoma, in December 2024. Her treatment regimen included chemotherapy with carboplatin and paclitaxel, alongside radiation therapy, which she completed yesterday.  She experiences a burning sensation in the central chest, described as 'too much burn,' which is internal and not related to the skin. This has led to  odynophagia, making it difficult to swallow. She is unable to consume solid foods and struggles with fluid intake, managing only small amounts, approximately two 7-8 ounce bottles per day.  She uses hydrocodone-acetaminophen (Hycet) for pain management, taking it twice daily, but finds it insufficient. She is not using Carafate or Protonix, as they were previously ineffective. She continues to take medication for blood pressure.  She has received IV fluids in the past, which were beneficial, especially during a challenging period in the third month of her treatment.       MEDICAL HISTORY: Past Medical History:  Diagnosis Date   Aortic stenosis    Arthritis    Breast cancer (HCC)    Chronic back pain    Colon polyps    GERD (gastroesophageal reflux disease)    Hypertension    Malignant neoplasm of right female breast (HCC)    unspecified site of breast   Osteoarthritis    of the knee left worse than right   Prediabetes     ALLERGIES:  is allergic to aspirin, gabapentin, losartan potassium-hctz, and penicillins.  MEDICATIONS:  Current Outpatient Medications  Medication Sig Dispense Refill   eszopiclone (LUNESTA) 2 MG TABS tablet Take 1 tablet (2 mg total) by mouth at bedtime as needed for sleep. Take immediately before bedtime 20 tablet 3   famotidine (ZANTAC 360 MAX ST) 20 MG tablet Take 1 tablet (20 mg total) by mouth 2 (two) times daily. 60 tablet 1   HYDROcodone-acetaminophen (HYCET) 7.5-325 mg/15 ml solution Take 10 mLs by mouth every 6 (six) hours as needed for moderate pain (pain score 4-6). 300 mL 0   lidocaine-prilocaine (EMLA) cream Apply 1 Application topically as needed.  30 g 2   losartan (COZAAR) 100 MG tablet Take 100 mg by mouth daily.     magnesium gluconate (MAGONATE) 500 MG tablet Take 500 mg by mouth 2 (two) times daily.     Multiple Vitamin (MULTIVITAMIN) capsule Take 1 capsule by mouth daily.     ondansetron (ZOFRAN) 8 MG tablet Take 1 tablet (8 mg total) by  mouth every 8 (eight) hours as needed for nausea or vomiting. Start on the third day after chemotherapy. 30 tablet 1   pantoprazole (PROTONIX) 20 MG tablet Take 20 mg by mouth daily.     prochlorperazine (COMPAZINE) 10 MG tablet Take 1 tablet (10 mg total) by mouth every 6 (six) hours as needed for nausea or vomiting. 30 tablet 1   sucralfate (CARAFATE) 1 g tablet Take 1 tablet (1 g total) by mouth 4 (four) times daily. Dissolve each tablet in 15 cc water before use. 120 tablet 2   sucralfate (CARAFATE) 1 g tablet Take 1 tablet (1 g total) by mouth 4 (four) times daily as needed. Take 30 minutes before mealtimes, and before bed. Mix with 15 cc's of water. 120 tablet 1   No current facility-administered medications for this visit.    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   BREAST LUMPECTOMY WITH NEEDLE LOCALIZATION Right 05/11/2013   Procedure: RIGHT BREAST NEEDLE LOCALIZATION  LUMPECTOMY;  Surgeon: Almond Lint, MD;  Location: [facility];  Service: General;  Laterality: Right;   BREAST LUMPECTOMY WITH RADIOACTIVE SEED LOCALIZATION Right 08/02/2020   Procedure: RIGHT BREAST LUMPECTOMY WITH RADIOACTIVE SEED LOCALIZATION;  Surgeon: Almond Lint, MD;  Location: [facility];  Service: General;  Laterality: Right;  RNFA   BRONCHIAL BIOPSY  10/07/2023   Procedure: BRONCHIAL BIOPSIES;  Surgeon: Leslye Peer, MD;  Location: [facility] ENDOSCOPY;  Service: Pulmonary;;   BRONCHIAL BRUSHINGS  10/07/2023   Procedure: BRONCHIAL BRUSHINGS;  Surgeon: Leslye Peer, MD;  Location: [facility] ENDOSCOPY;  Service: Pulmonary;;   BRONCHIAL NEEDLE ASPIRATION BIOPSY  10/07/2023   Procedure: BRONCHIAL NEEDLE ASPIRATION BIOPSIES;  Surgeon: Leslye Peer, MD;  Location: [facility] ENDOSCOPY;  Service: Pulmonary;;   CHOLECYSTECTOMY      COLONOSCOPY     FOOT OSTEOTOMY     both  feet   IR IMAGING GUIDED PORT INSERTION  11/07/2023   TONSILLECTOMY     VIDEO BRONCHOSCOPY WITH ENDOBRONCHIAL ULTRASOUND N/A 10/07/2023   Procedure: VIDEO BRONCHOSCOPY WITH ENDOBRONCHIAL ULTRASOUND;  Surgeon: Leslye Peer, MD;  Location: [facility] ENDOSCOPY;  Service: Pulmonary;  Laterality: N/A;    REVIEW OF SYSTEMS:  Constitutional: positive for fatigue and weight loss Eyes: negative Ears, nose, mouth, throat, and face: negative Respiratory: negative Cardiovascular: negative Gastrointestinal: positive for dyspepsia and odynophagia Genitourinary:negative Integument/breast: negative Hematologic/lymphatic: negative Musculoskeletal:negative Neurological: negative Behavioral/Psych: negative Endocrine: negative Allergic/Immunologic: negative   PHYSICAL EXAMINATION: General appearance: alert, cooperative, fatigued, and no distress Head: Normocephalic, without obvious abnormality, atraumatic Neck: no adenopathy, no JVD, supple, symmetrical, trachea midline, and thyroid not enlarged, symmetric, no tenderness/mass/nodules Lymph nodes: Cervical, supraclavicular, and axillary nodes normal. Resp: clear to auscultation bilaterally Back: symmetric, no curvature. ROM normal. No CVA tenderness. Cardio: regular rate and rhythm, S1, S2 normal, no murmur, click, rub or gallop GI: soft, non-tender; bowel sounds normal; no masses,  no organomegaly Extremities: extremities normal, atraumatic, no  cyanosis or edema Neurologic: Alert and oriented X 3, normal strength and tone. Normal symmetric reflexes. Normal coordination and gait  ECOG PERFORMANCE STATUS: 1 - Symptomatic but completely ambulatory  Blood pressure (!) 148/100, pulse (!) 115, temperature 99.1 F (37.3 C), temperature source Temporal, resp. rate 16, weight 120 lb (54.4 kg), SpO2 99%.  LABORATORY DATA: Lab Results  Component Value Date   WBC 2.2 (L) 12/09/2023   HGB 10.9 (L) 12/09/2023   HCT 32.6  (L) 12/09/2023   MCV 78.4 (L) 12/09/2023   PLT 119 (L) 12/09/2023      Chemistry      Component Value Date/Time   NA 133 (L) 12/09/2023 1055   NA 139 10/17/2015 1322   K 4.0 12/09/2023 1055   K 4.1 10/17/2015 1322   CL 98 12/09/2023 1055   CO2 29 12/09/2023 1055   CO2 28 10/17/2015 1322   BUN 18 12/09/2023 1055   BUN 14.5 10/17/2015 1322   CREATININE 0.62 12/09/2023 1055   CREATININE 0.87 04/10/2016 1540   CREATININE 0.9 10/17/2015 1322      Component Value Date/Time   CALCIUM 9.2 12/09/2023 1055   CALCIUM 10.1 10/17/2015 1322   ALKPHOS 45 12/09/2023 1055   ALKPHOS 103 10/17/2015 1322   AST 19 12/09/2023 1055   AST 24 10/17/2015 1322   ALT 26 12/09/2023 1055   ALT 32 10/17/2015 1322   BILITOT 0.6 12/09/2023 1055   BILITOT 0.33 10/17/2015 1322       RADIOGRAPHIC STUDIES: No results found.   ASSESSMENT AND PLAN: This is a very pleasant 73 years old female with stage IIIb (T1c, N3, M0) non-small cell lung cancer, adenocarcinoma presented with right middle lobe lung nodule in addition to bilateral hilar and mediastinal lymphadenopathy diagnosed in December 2024.  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 She is currently on concurrent chemoradiation with weekly carboplatin for AUC of 2 and paclitaxel 45 Mg/M2.  First dose October 29, 2023.  She is status post 6 cycles.  She has been tolerating the treatment well except for the fatigue as well as radiation-induced odynophagia and dysphagia with poor p.o. intake.     Stage III B Non-Small Cell Lung Cancer (Adenocarcinoma) Completed chemotherapy and radiation therapy with weekly carboplatin and paclitaxel. Radiation therapy concluded yesterday. Experiencing significant odynophagia and dysphagia, likely due to radiation. Using hydrocodone-acetaminophen (Hycet) for pain. Explained that combined chemo-radiation therapy shows better outcomes based on  clinical trials. Burning sensation may persist for several weeks; follow-up scan to assess treatment response. - Reintroduce Carafate 1 gram QID before meals and at bedtime - Continue hydrocodone-acetaminophen (Hycet) for pain management, increase frequency if needed - Administer IV fluids today - Avoid spicy, hot, or cold foods; consume room temperature foods and liquids - Gradually increase oral intake to improve esophageal function - Consider gastroenterology referral if symptoms persist for several weeks - Schedule follow-up scan in several weeks to assess treatment response - Follow-up appointment in one month  Odynophagia Severe esophageal burning sensation, making swallowing difficult, likely a side effect of recent radiation therapy for lung cancer. Carafate was previously discontinued due to perceived lack of efficacy. Explained that Carafate may help with continued use. - Reintroduce Carafate 1 gram QID before meals and at bedtime - Continue hydrocodone-acetaminophen (Hycet) for pain management, increase frequency if needed - Administer IV fluids today - Avoid spicy, hot, or cold foods; consume room temperature foods and liquids - Gradually increase  oral intake to improve esophageal function - Consider gastroenterology referral if symptoms persist for several weeks  General Health Maintenance No new general health maintenance issues discussed. - Gradually increase oral intake - Avoid spicy, hot, or cold foods; consume room temperature foods and liquids  Follow-up - Schedule follow-up scan in several weeks to assess treatment response - Follow-up appointment in one month.   The patient was advised to call immediately if she has any concerning symptoms in the interval. The patient voices understanding of current disease status and treatment options and is in agreement with the current care plan.  All questions were answered. The patient knows to call the clinic with any  problems, questions or concerns. We can certainly see the patient much sooner if necessary.  The total time spent in the appointment was 30 minutes.  Disclaimer: This note was dictated with voice recognition software. Similar sounding words can inadvertently be transcribed and may not be corrected upon review.

## 2023-12-10 NOTE — Patient Instructions (Signed)

## 2023-12-10 NOTE — Radiation Completion Notes (Addendum)
  Radiation Oncology         [phone] ________________________________  Name: Ashley Pratt MRN: [id number]  Date of Service: 12/09/2023  DOB: 1950-11-10  End of Treatment Note     Diagnosis:  Stage IIIA, cT1cN2M0, NSCLC, adenocarcinoma of the RML   Intent: Curative     ==========DELIVERED PLANS==========  First Treatment Date: 2023-10-28 Last Treatment Date: 2023-12-09   Plan Name: Lung_R Site: Lung, Right Technique: IMRT Mode: Photon Dose Per Fraction: 2 Gy Prescribed Dose (Delivered / Prescribed): 60 Gy / 60 Gy Prescribed Fxs (Delivered / Prescribed): 30 / 30     ==========ON TREATMENT VISIT DATES========== 2023-11-01, 2023-11-08, 2023-11-15, 2023-11-18, 2023-11-21, 2023-11-29, 2023-12-05    See weekly On Treatment Notes in Epic for details in the Media tab (listed as Progress notes on the On Treatment Visit Dates listed above).The patient tolerated radiation. She developed fatigue and anticipated skin changes in the treatment field. She also had significant trouble with esophagitis requiring multiple medications to improve this.   The patient will receive a call in about one month from the radiation oncology department. She will continue follow up with Dr. Arbutus Ped as well.      Osker Mason, PAC

## 2023-12-10 NOTE — Progress Notes (Signed)
Per Dr. Arbutus Ped , Chemotherapy cancelled and pt to receive IVF.

## 2023-12-10 NOTE — Progress Notes (Signed)
Nutrition Follow-up:  Pt with malignant neoplasm of middle lobe of right lung. She is receiving concurrent chemoradiation with weekly carbo/taxol.   Concurrent therapy held secondary to side effects. Patient is receiving supportive therapy with IV fluids.  Met with patient and husband in infusion. Patient tolerating small sips of thin liquids secondary to radiation induced esophagitis. Currently drinking 2 Ensure (250 kcal). She was unable to tolerate Ensure Complete due to thickness. Husband tried diluting with water. Patient unable to drink broth or soups. Does okay with popsicles. Reports half of popsicle last night. Patient drinking sips of water. Tolerates ~half cup every few hours. She is taking Hycet without much relief.   Medications: reviewed   Labs: Na 133, glucose 113  Anthropometrics: Wt 120 lb today decreased from 123 lb 3.2 oz on 2/6  2/3 - 121 lb 9.6 oz 1/27 - 122 lb 8 oz  1/9 - 127 lb 14.4 oz    NUTRITION DIAGNOSIS: Unintended wt loss continues   INTERVENTION:  Supportive care via IVF per MD Encouraged increasing protein shakes, recommend 3-4/day as tolerated Take Hycet as prescribed Support and encouragement     MONITORING, EVALUATION, GOAL: wt trends, intake   NEXT VISIT: To be scheduled in collaboration with upcoming [facility] appointments

## 2023-12-11 ENCOUNTER — Ambulatory Visit: Payer: HMO

## 2023-12-12 ENCOUNTER — Ambulatory Visit: Payer: HMO

## 2023-12-12 ENCOUNTER — Other Ambulatory Visit: Payer: Self-pay

## 2023-12-13 ENCOUNTER — Ambulatory Visit: Payer: HMO

## 2023-12-16 ENCOUNTER — Telehealth: Payer: Self-pay | Admitting: Medical Oncology

## 2023-12-16 ENCOUNTER — Telehealth: Payer: Self-pay | Admitting: *Deleted

## 2023-12-16 ENCOUNTER — Ambulatory Visit: Payer: HMO | Admitting: Radiation Oncology

## 2023-12-16 NOTE — Telephone Encounter (Signed)
Burning in chest area, throat is not a problem .  Marland KitchenShe can  swallow  soft foods. drinks Boost /bouillon  popsicles,  hot tea with millk and dissolved crackers in it.   What can be done to repair the inflammation caused by the radiation ?    Hydrocodone helps with pain ,but not underlying problem.  Message sent to xrt providers.

## 2023-12-16 NOTE — Telephone Encounter (Signed)
Spoke with the patient's husband regarding throat/chest irritation since completion of her radiation therapy to her chest 12/09/2023.  He reports she continues to have burning in her chest and pain.  She is taking hycet that works occasionally but notes it is not as effective as it once was.  He reports her swallowing is better but she is still unable to eat/drink much due to the burning in her chest.  She has been supplementing with boost, getting in about 3 bottles per day.  She has stopped taking carafate and zantac stating it was causing nausea/vomiting.  He reports she has a history of acid reflux and was taking protonix with relief prior to chemoRT initiation.  He was advised to have her start taking mylanta, resume carafate and continue with the hycet PRN.  He verbalized understanding.    Lind Covert RN, BSN

## 2023-12-17 ENCOUNTER — Ambulatory Visit: Payer: HMO

## 2023-12-18 ENCOUNTER — Ambulatory Visit: Payer: HMO

## 2023-12-19 ENCOUNTER — Other Ambulatory Visit: Payer: Self-pay | Admitting: Radiation Oncology

## 2023-12-19 MED ORDER — HYDROCODONE-ACETAMINOPHEN 7.5-325 MG/15ML PO SOLN: 10.0000 mL | Freq: Four times a day (QID) | ORAL | 0 refills | Status: DC | PRN

## 2023-12-19 NOTE — Addendum Note (Signed)
Encounter addended by: Edward Qualia on: 12/19/2023 9:34 AM  Actions taken: Imaging Exam ended

## 2023-12-19 NOTE — Addendum Note (Signed)
Encounter addended by: Edward Qualia on: 12/19/2023 9:36 AM  Actions taken: Imaging Exam ended

## 2023-12-20 ENCOUNTER — Encounter: Payer: Self-pay | Admitting: Internal Medicine

## 2023-12-30 ENCOUNTER — Other Ambulatory Visit: Payer: Medicare HMO

## 2023-12-30 ENCOUNTER — Encounter: Payer: Self-pay | Admitting: Internal Medicine

## 2024-01-06 ENCOUNTER — Ambulatory Visit
Admission: RE | Admit: 2024-01-06 | Discharge: 2024-01-06 | Disposition: A | Discharge status: Home / Self Care | Payer: Medicare HMO | Source: Ambulatory Visit | Attending: Radiation Oncology | Admitting: Radiation Oncology

## 2024-01-06 ENCOUNTER — Other Ambulatory Visit: Payer: Self-pay | Admitting: Internal Medicine

## 2024-01-06 NOTE — Progress Notes (Addendum)
  Radiation Oncology         [phone] ________________________________  Name: Ashley Pratt MRN: [id number]  Date of Service: 01/06/2024  DOB: 1951/04/21  Post Treatment Telephone Note  Diagnosis:  Stage IIIA, cT1cN2M0, NSCLC, adenocarcinoma of the RML (as documented in provider EOT note) (as documented in provider EOT note)  The patient was available for call today. [employer] ID# [phone] (language Urdu [place]).   Symptoms of fatigue have not improved since completing therapy.  Symptoms of skin changes have improved since completing therapy.  Symptoms of esophagitis have not improved since completing therapy and has a moderately reduced appetite.  Dr. Arbutus Ped is aware of these concerns via "In Basket".  The patient has scheduled follow up with her medical oncologist Dr. Arbutus Ped on 01/14/24 for ongoing care, and was encouraged to call if she develops concerns or questions regarding radiation.   This concludes the interaction.  Ruel Favors, LPN

## 2024-01-07 ENCOUNTER — Ambulatory Visit (HOSPITAL_COMMUNITY)
Admission: RE | Admit: 2024-01-07 | Discharge: 2024-01-07 | Disposition: A | Discharge status: Home / Self Care | Payer: HMO | Source: Ambulatory Visit | Attending: Internal Medicine | Admitting: Internal Medicine

## 2024-01-07 ENCOUNTER — Inpatient Hospital Stay

## 2024-01-07 ENCOUNTER — Inpatient Hospital Stay: Payer: Medicare HMO | Attending: Hematology

## 2024-01-07 DIAGNOSIS — C342 Malignant neoplasm of middle lobe, bronchus or lung: Secondary | ICD-10-CM | POA: Diagnosis not present

## 2024-01-07 DIAGNOSIS — R590 Localized enlarged lymph nodes: Secondary | ICD-10-CM | POA: Diagnosis not present

## 2024-01-07 DIAGNOSIS — Z9221 Personal history of antineoplastic chemotherapy: Secondary | ICD-10-CM | POA: Insufficient documentation

## 2024-01-07 DIAGNOSIS — Z923 Personal history of irradiation: Secondary | ICD-10-CM | POA: Insufficient documentation

## 2024-01-07 DIAGNOSIS — Z79899 Other long term (current) drug therapy: Secondary | ICD-10-CM | POA: Diagnosis not present

## 2024-01-07 DIAGNOSIS — C3490 Malignant neoplasm of unspecified part of unspecified bronchus or lung: Secondary | ICD-10-CM | POA: Insufficient documentation

## 2024-01-07 DIAGNOSIS — R1310 Dysphagia, unspecified: Secondary | ICD-10-CM | POA: Diagnosis not present

## 2024-01-07 DIAGNOSIS — G4700 Insomnia, unspecified: Secondary | ICD-10-CM | POA: Insufficient documentation

## 2024-01-07 DIAGNOSIS — Z95828 Presence of other vascular implants and grafts: Secondary | ICD-10-CM

## 2024-01-07 DIAGNOSIS — I700 Atherosclerosis of aorta: Secondary | ICD-10-CM | POA: Diagnosis not present

## 2024-01-07 LAB — CBC WITH DIFFERENTIAL (CANCER CENTER ONLY)
Abs Immature Granulocytes: 0.01 10*3/uL (ref 0.00–0.07)
Basophils Absolute: 0 10*3/uL (ref 0.0–0.1)
Basophils Relative: 0 %
Eosinophils Absolute: 0 10*3/uL (ref 0.0–0.5)
Eosinophils Relative: 1 %
HCT: 32.7 % — ABNORMAL LOW (ref 36.0–46.0)
Hemoglobin: 10.7 g/dL — ABNORMAL LOW (ref 12.0–15.0)
Immature Granulocytes: 0 %
Lymphocytes Relative: 15 %
Lymphs Abs: 0.9 10*3/uL (ref 0.7–4.0)
MCH: 26.1 pg (ref 26.0–34.0)
MCHC: 32.7 g/dL (ref 30.0–36.0)
MCV: 79.8 fL — ABNORMAL LOW (ref 80.0–100.0)
Monocytes Absolute: 0.7 10*3/uL (ref 0.1–1.0)
Monocytes Relative: 10 %
Neutro Abs: 4.8 10*3/uL (ref 1.7–7.7)
Neutrophils Relative %: 74 %
Platelet Count: 251 10*3/uL (ref 150–400)
RBC: 4.1 MIL/uL (ref 3.87–5.11)
RDW: 18.3 % — ABNORMAL HIGH (ref 11.5–15.5)
WBC Count: 6.5 10*3/uL (ref 4.0–10.5)
nRBC: 0 % (ref 0.0–0.2)

## 2024-01-07 LAB — CMP (CANCER CENTER ONLY)
ALT: 33 U/L (ref 0–44)
AST: 26 U/L (ref 15–41)
Albumin: 3.8 g/dL (ref 3.5–5.0)
Alkaline Phosphatase: 53 U/L (ref 38–126)
Anion gap: 4 — ABNORMAL LOW (ref 5–15)
BUN: 16 mg/dL (ref 8–23)
CO2: 29 mmol/L (ref 22–32)
Calcium: 9 mg/dL (ref 8.9–10.3)
Chloride: 99 mmol/L (ref 98–111)
Creatinine: 0.66 mg/dL (ref 0.44–1.00)
GFR, Estimated: >60 mL/min (ref >60)
Glucose, Bld: 126 mg/dL — ABNORMAL HIGH (ref 70–99)
Potassium: 4.7 mmol/L (ref 3.5–5.1)
Sodium: 132 mmol/L — ABNORMAL LOW (ref 135–145)
Total Bilirubin: 0.4 mg/dL (ref 0.0–1.2)
Total Protein: 6.5 g/dL (ref 6.5–8.1)

## 2024-01-07 MED ORDER — SODIUM CHLORIDE 0.9% FLUSH
10.0000 mL | Freq: Once | INTRAVENOUS | Status: AC
  Administered 2024-01-07: 10 mL

## 2024-01-07 MED ORDER — IOHEXOL 300 MG/ML  SOLN
75.0000 mL | Freq: Once | INTRAMUSCULAR | Status: AC | PRN
  Administered 2024-01-07: 75 mL via INTRAVENOUS

## 2024-01-13 ENCOUNTER — Ambulatory Visit: Payer: Medicare HMO | Admitting: Cardiology

## 2024-01-14 ENCOUNTER — Other Ambulatory Visit: Payer: Self-pay

## 2024-01-14 ENCOUNTER — Inpatient Hospital Stay: Payer: Medicare HMO | Admitting: Internal Medicine

## 2024-01-14 ENCOUNTER — Telehealth: Payer: Self-pay | Admitting: Pharmacist

## 2024-01-14 ENCOUNTER — Encounter: Payer: Self-pay | Admitting: Internal Medicine

## 2024-01-14 ENCOUNTER — Other Ambulatory Visit (HOSPITAL_COMMUNITY): Payer: Self-pay

## 2024-01-14 VITALS — BP 136/81 | HR 95 | Temp 97.7°F | Resp 15 | Ht 60.0 in | Wt 121.7 lb

## 2024-01-14 DIAGNOSIS — C342 Malignant neoplasm of middle lobe, bronchus or lung: Secondary | ICD-10-CM | POA: Diagnosis not present

## 2024-01-14 MED ORDER — OSIMERTINIB MESYLATE 80 MG PO TABS
80.0000 mg | ORAL_TABLET | Freq: Every day | ORAL | 2 refills | Status: AC
Start: 2024-01-16 — End: ?
  Filled 2024-01-16: qty 30, 30d supply, fill #0
  Filled 2024-02-04: qty 30, 30d supply, fill #1
  Filled 2024-03-05: qty 30, 30d supply, fill #2

## 2024-01-14 MED ORDER — OSIMERTINIB MESYLATE 80 MG PO TABS: 80.0000 mg | ORAL_TABLET | Freq: Every day | ORAL | 2 refills | Status: DC

## 2024-01-14 NOTE — Progress Notes (Signed)
 [facility] [phone]   [phone]  OFFICE PROGRESS NOTE  Georgann Housekeeper, MD [address] [place] [address]  DIAGNOSIS:  stage IIIb (T1c, N3, M0) non-small cell lung cancer, adenocarcinoma presented with right middle lobe lung nodule in addition to bilateral hilar and mediastinal lymphadenopathy diagnosed in December 2024.   Biomarker Findings HRD signature - HRDsig Negative Microsatellite status - MS-Stable Tumor Mutational Burden - 2 Muts/Mb Genomic Findings For a complete list of the genes assayed, please refer to the Appendix. EGFR G719C, E709V, amplification CDKN2A loss - equivocal? MTAP loss - equivocal? CDKN2B loss - equivocal? FGF23 R187W - subclonal? TP53 R248L 7 Disease relevant genes with no reportable alterations: ALK, BRAF, ERBB2, KRAS, MET, RET, ROS1  PDL1 Expression 30%   PRIOR THERAPY: Concurrent chemoradiation with weekly carboplatin for AUC of 2 and paclitaxel 45 Mg/M2.  First dose October 29, 2023.  Status post 6 cycles.  CURRENT THERAPY: Consolidation treatment with Tagrisso 80 mg p.o. daily.  Expected to start in the next few days.   INTERVAL HISTORY: Ashley Pratt 73 y.o. female returns to the clinic today for follow-up visit accompanied by her husband.  Discussed the use of AI scribe software for clinical note transcription with the patient, who gave verbal consent to proceed.  History of Present Illness   Ashley Pratt is a 73 year old female with stage 3B non-small cell lung cancer who presents for follow-up after completing chemotherapy and radiation. She is accompanied by her husband.  She was diagnosed with stage 3B non-small cell lung cancer, adenocarcinoma, in December 2024. She completed approximately six and a half weeks of chemotherapy and radiation with weekly carboplatin and paclitaxel. Since completing treatment, she initially felt 'very bad' with symptoms  including lack of appetite, dizziness, and weakness, but notes improvement over the past three to four days.  She experienced difficulty swallowing for about ten days post-treatment, which has recently improved. She is currently able to consume liquids and some solids, such as eggs, bread, and crackers, often softened with milk. She has gained approximately 1.7 pounds recently, indicating some improvement in her nutritional status.  Her breathing is described as 'not bad,' although she experiences occasional pain under her left breast. A recent scan showed some improvement in the size of the tumor in the right lung, decreasing from 2.6 x 2.1 cm to 2.1 x 1.5 cm. The lymph nodes remain similar in size.  She reports difficulty sleeping and has tried various over-the-counter medications, including Tylenol PM and melatonin, without significant improvement. She does not sleep during the day.       MEDICAL HISTORY: Past Medical History:  Diagnosis Date   Aortic stenosis    Arthritis    Breast cancer (HCC)    Chronic back pain    Colon polyps    GERD (gastroesophageal reflux disease)    Hypertension    Malignant neoplasm of right female breast (HCC)    unspecified site of breast   Osteoarthritis    of the knee left worse than right   Prediabetes     ALLERGIES:  is allergic to aspirin, gabapentin, losartan potassium-hctz, and penicillins.  MEDICATIONS:  Current Outpatient Medications  Medication Sig Dispense Refill   eszopiclone (LUNESTA) 2 MG TABS tablet Take 1 tablet (2 mg total) by mouth at bedtime as needed for sleep. Take immediately before bedtime 20 tablet 3   famotidine (ZANTAC 360 MAX ST) 20 MG tablet  Take 1 tablet (20 mg total) by mouth 2 (two) times daily. 60 tablet 1   HYDROcodone-acetaminophen (HYCET) 7.5-325 mg/15 ml solution Take 10 mLs by mouth every 6 (six) hours as needed for moderate pain (pain score 4-6). 300 mL 0   lidocaine-prilocaine (EMLA) cream Apply 1 Application  topically as needed. 30 g 2   losartan (COZAAR) 100 MG tablet Take 100 mg by mouth daily.     magnesium gluconate (MAGONATE) 500 MG tablet Take 500 mg by mouth 2 (two) times daily.     Multiple Vitamin (MULTIVITAMIN) capsule Take 1 capsule by mouth daily.     ondansetron (ZOFRAN) 8 MG tablet Take 1 tablet (8 mg total) by mouth every 8 (eight) hours as needed for nausea or vomiting. Start on the third day after chemotherapy. 30 tablet 1   pantoprazole (PROTONIX) 20 MG tablet Take 20 mg by mouth daily.     prochlorperazine (COMPAZINE) 10 MG tablet Take 1 tablet (10 mg total) by mouth every 6 (six) hours as needed for nausea or vomiting. 30 tablet 1   sucralfate (CARAFATE) 1 g tablet Take 1 tablet (1 g total) by mouth 4 (four) times daily. Dissolve each tablet in 15 cc water before use. 120 tablet 2   sucralfate (CARAFATE) 1 g tablet Take 1 tablet (1 g total) by mouth 4 (four) times daily as needed. Take 30 minutes before mealtimes, and before bed. Mix with 15 cc's of water. 120 tablet 1   No current facility-administered medications for this visit.    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   BREAST LUMPECTOMY WITH NEEDLE LOCALIZATION Right 05/11/2013   Procedure: RIGHT BREAST NEEDLE LOCALIZATION  LUMPECTOMY;  Surgeon: Almond Lint, MD;  Location: [facility];  Service: General;  Laterality: Right;   BREAST LUMPECTOMY WITH RADIOACTIVE SEED LOCALIZATION Right 08/02/2020   Procedure: RIGHT BREAST LUMPECTOMY WITH RADIOACTIVE SEED LOCALIZATION;  Surgeon: Almond Lint, MD;  Location: [facility];  Service: General;  Laterality: Right;  RNFA   BRONCHIAL BIOPSY  10/07/2023   Procedure: BRONCHIAL BIOPSIES;  Surgeon: Leslye Peer, MD;  Location: [facility] END[facility];  Service: Pulmonary;;   BRONCHIAL BRUSHINGS   10/07/2023   Procedure: BRONCHIAL BRUSHINGS;  Surgeon: Leslye Peer, MD;  Location: [facility] ENDOSCOPY;  Service: Pulmonary;;   BRONCHIAL NEEDLE ASPIRATION BIOPSY  10/07/2023   Procedure: BRONCHIAL NEEDLE ASPIRATION BIOPSIES;  Surgeon: Leslye Peer, MD;  Location: [facility] ENDOSCOPY;  Service: Pulmonary;;   CHOLECYSTECTOMY     COLONOSCOPY     FOOT OSTEOTOMY     both  feet   IR IMAGING GUIDED PORT INSERTION  11/07/2023   TONSILLECTOMY     VIDEO BRONCHOSCOPY WITH ENDOBRONCHIAL ULTRASOUND N/A 10/07/2023   Procedure: VIDEO BRONCHOSCOPY WITH ENDOBRONCHIAL ULTRASOUND;  Surgeon: Leslye Peer, MD;  Location: [facility] ENDOSCOPY;  Service: Pulmonary;  Laterality: N/A;    REVIEW OF SYSTEMS:  Constitutional: positive for fatigue and weight loss Eyes: negative Ears, nose, mouth, throat, and face: negative Respiratory: negative Cardiovascular: negative Gastrointestinal: positive for dyspepsia and odynophagia Genitourinary:negative Integument/breast: negative Hematologic/lymphatic: negative Musculoskeletal:negative Neurological: negative Behavioral/Psych: negative Endocrine: negative Allergic/Immunologic: negative   PHYSICAL EXAMINATION: General appearance: alert, cooperative, fatigued, and no distress Head: Normocephalic, without obvious abnormality, atraumatic Neck: no adenopathy, no JVD, supple, symmetrical, trachea midline, and thyroid not enlarged, symmetric,  no tenderness/mass/nodules Lymph nodes: Cervical, supraclavicular, and axillary nodes normal. Resp: clear to auscultation bilaterally Back: symmetric, no curvature. ROM normal. No CVA tenderness. Cardio: regular rate and rhythm, S1, S2 normal, no murmur, click, rub or gallop GI: soft, non-tender; bowel sounds normal; no masses,  no organomegaly Extremities: extremities normal, atraumatic, no cyanosis or edema Neurologic: Alert and oriented X 3, normal strength and tone. Normal symmetric reflexes. Normal coordination and gait  ECOG PERFORMANCE  STATUS: 1 - Symptomatic but completely ambulatory  Blood pressure 136/81, pulse 95, temperature 97.7 F (36.5 C), temperature source Temporal, resp. rate 15, height 5' (1.524 m), weight 121 lb 11.2 oz (55.2 kg), SpO2 100%.  LABORATORY DATA: Lab Results  Component Value Date   WBC 6.5 01/07/2024   HGB 10.7 (L) 01/07/2024   HCT 32.7 (L) 01/07/2024   MCV 79.8 (L) 01/07/2024   PLT 251 01/07/2024      Chemistry      Component Value Date/Time   NA 132 (L) 01/07/2024 1016   NA 139 10/17/2015 1322   K 4.7 01/07/2024 1016   K 4.1 10/17/2015 1322   CL 99 01/07/2024 1016   CO2 29 01/07/2024 1016   CO2 28 10/17/2015 1322   BUN 16 01/07/2024 1016   BUN 14.5 10/17/2015 1322   CREATININE 0.66 01/07/2024 1016   CREATININE 0.87 04/10/2016 1540   CREATININE 0.9 10/17/2015 1322      Component Value Date/Time   CALCIUM 9.0 01/07/2024 1016   CALCIUM 10.1 10/17/2015 1322   ALKPHOS 53 01/07/2024 1016   ALKPHOS 103 10/17/2015 1322   AST 26 01/07/2024 1016   AST 24 10/17/2015 1322   ALT 33 01/07/2024 1016   ALT 32 10/17/2015 1322   BILITOT 0.4 01/07/2024 1016   BILITOT 0.33 10/17/2015 1322       RADIOGRAPHIC STUDIES: CT Chest W Contrast Result Date: 01/14/2024 CLINICAL DATA:  Staging non-small-cell lung cancer. * Tracking Code: BO * EXAM: CT CHEST WITH CONTRAST TECHNIQUE: Multidetector CT imaging of the chest was performed during intravenous contrast administration. RADIATION DOSE REDUCTION: This exam was performed according to the departmental dose-optimization program which includes automated exposure control, adjustment of the mA and/or kV according to patient size and/or use of iterative reconstruction technique. CONTRAST:  75mL OMNIPAQUE IOHEXOL 300 MG/ML  SOLN COMPARISON:  CT 10/17/2023. FINDINGS: Cardiovascular: Heart is slightly enlarged. Trace pericardial fluid. Thoracic aorta is normal course and caliber with partially calcified atherosclerotic plaque. Pulsation artifact along the  ascending aorta. Right IJ chest port in place with tip extending to the central SVC above the right atrium. 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 Mediastinum/Nodes: Patulous esophagus with luminal air and fluid. There are areas of wall thickening. Particular along the mid to lower thoracic esophagus. The wall thickening is new from previous. Please correlate with symptoms of esophagitis or other process. No specific abnormal lymph node enlargement seen in the axillary regions. There are some small hilar nodes bilaterally which are less than a cm in short axis and not pathologic by size criteria. Small mediastinal nodes are also identified. Specific lesion to the right of the carina had a short axis on the prior of 8 mm. Today on series 2, image 49 this measures 9 mm in short axis. Subcarinal node is larger today measuring 9 mm in short axis on series 2, image 53 and previously the would have measured when the 6 mm. There is some edema along the mediastinum. Lungs/Pleura: Breathing  motion seen throughout the examination. No pneumothorax or effusion. Masslike area with spiculation seen right perihilar along the posterior aspect of the middle lobe is again seen. Previously this measured 2.3 x 2.1 cm. Today when measured in the same fashion taking into account the motion lesion would measure approximately 2.1 by 1.5 cm. Smaller overall from previous. Increasing bandlike changes. Some bronchiectasis seen along the inferior aspect of the middle lobe more peripherally. No new dominant mass identified. Upper Abdomen: Fatty liver identified. Previous cholecystectomy. The adrenal glands are preserved. Musculoskeletal: Scattered moderate degenerative changes along the spine with bridging osteophytes. IMPRESSION: Slight decrease in spiculated masslike area right perihilar in the middle lobe. Increasing wall thickening along the esophagus which is patulous. There is also mediastinal stranding  and some prominent nodes. If you have increased. Please correlate for symptomatology of esophagitis and recommend follow up evaluation. Cardiomegaly. Aortic Atherosclerosis (ICD10-I70.0). Electronically Signed   By: Karen Kays M.D.   On: 01/14/2024 11:05    ASSESSMENT AND PLAN: This is a very pleasant 73 years old female with stage IIIb (T1c, N3, M0) non-small cell lung cancer, adenocarcinoma presented with right middle lobe lung nodule in addition to bilateral hilar and mediastinal lymphadenopathy diagnosed in December 2024.  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 She is currently on concurrent chemoradiation with weekly carboplatin for AUC of 2 and paclitaxel 45 Mg/M2.  First dose October 29, 2023.  She is status post 6 cycles.  She has been tolerating the treatment well except for the fatigue as well as radiation-induced odynophagia and dysphagia with poor p.o. intake.  She is recovering well from the previous course of concurrent chemoradiation.  She had repeat CT scan of the chest performed recently.  I personally and independently reviewed the scan images and discussed the result with the patient and her husband.  Her scan showed mild improvement in her disease especially in the right upper lobe mass with stable mediastinal lymphadenopathy. I had a lengthy discussion with the patient and her husband about her current condition and treatment options. Assessment and Plan    Stage IIIb non-small cell lung cancer, adenocarcinoma with EGFR mutation Diagnosed in December 2024. Completed chemotherapy and radiation with carboplatin and paclitaxel. Recent scan shows tumor reduction from 2.6 x 2.1 cm to 2.1 x 1.5 cm. Lymph nodes remain unchanged. 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 Considering targeted therapy with Tagrisso, which is approved for common mutations but may work for uncommon mutations. Discussed  potential side effects of Tagrisso, including skin rash and diarrhea. Shared decision-making with her and family, considering insurance coverage for Tagrisso. Explained that Edgar Frisk is not approved for uncommon mutations but may still be effective. The tumor has shown approximately 20% improvement post-treatment, and further reduction is anticipated with continued therapy. - Initiate Tagrisso (osimertinib) therapy - Otheroption would be consolidation Immunotherapy with Imfinzi but likely to be ineffective in patients with positive EGFR mutation. - Coordinate with pharmacist for medication counseling - Schedule follow-up scan in 2-3 months to assess response - Ensure insurance coverage for Tagrisso - Consider EKG prior to starting Tagrisso  Dysphagia Post-treatment dysphagia with improvement. Currently able to consume liquids and some solids like eggs and crackers. Gradual improvement in swallowing function. - Continue current dietary modifications to aid swallowing  Insomnia Difficulty sleeping post-treatment. Previous use of melatonin and other sleep aids without significant improvement. Advised against daytime sleeping to improve nighttime sleep quality. - Consider over-the-counter  sleep aids like Tylenol PM - Avoid daytime naps   The patient will come back for follow-up visit in 2-3 weeks for evaluation and management of any adverse effect of her treatment. She was advised to call immediately if she has any concerning symptoms in the interval. The patient voices understanding of current disease status and treatment options and is in agreement with the current care plan.  All questions were answered. The patient knows to call the clinic with any problems, questions or concerns. We can certainly see the patient much sooner if necessary.  The total time spent in the appointment was 55 minutes.  Disclaimer: This note was dictated with voice recognition software. Similar sounding words can  inadvertently be transcribed and may not be corrected upon review.

## 2024-01-14 NOTE — Progress Notes (Signed)
 DISCONTINUE OFF PATHWAY REGIMEN - Non-Small Cell Lung   [id number]:Carboplatin AUC=2 IV D1 + Paclitaxel 45 mg/m2 IV D1 q7 Days + RT:   A cycle is every 7 days, concurrent with RT:     Paclitaxel      Carboplatin   **Always confirm dose/schedule in your pharmacy ordering system**  PRIOR TREATMENT: Off Pathway: Carboplatin AUC=2 IV D1 + Paclitaxel 45 mg/m2 IV D1 q7 Days + RT  START ON PATHWAY REGIMEN - Non-Small Cell Lung     A cycle is every 28 days:     Osimertinib   **Always confirm dose/schedule in your pharmacy ordering system**  Patient Characteristics: Preoperative or Nonsurgical Candidate (Clinical Staging), Stage IIB (N2a only) or Stage III - Nonsurgical Candidate, PS = 0,1 Therapeutic Status: Preoperative or Nonsurgical Candidate (Clinical Staging) AJCC T Category: cT1c AJCC N Category: cN3 AJCC M Category: cM0 AJCC 9 Stage Grouping: IIIB Check here if patient was staged using an edition other than AJCC Staging 9th Edition: false ECOG Performance Status: 1 Intent of Therapy: Curative Intent, Discussed with Patient

## 2024-01-15 NOTE — Telephone Encounter (Signed)
 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   Counseled patient on administration, dosing, side effects, monitoring, drug-food interactions, safe handling, storage, and disposal.  CBC w/ Diff and CMP from 01/07/24 assessed, no relevant lab abnormalities requiring baseline dose adjustment required at this time. Baseline EKG obtained in clinic on 01/14/24, QTcB . Prescription dose and frequency assessed for appropriateness.  Patient will take Tagrisso 80 tablets, 1 tablet by mouth once daily, without regard to food.  Tagrisso start date: pending prior authorization approval  Adverse effects include but are not limited to: diarrhea, mouth sores, fatigue, dry skin, rash, nail changes, altered cardiac conduction, and decreased blood counts or electrolytes.  Diarrhea: Patient will obtain Imodium (loperamide) to have on hand if they experience diarrhea. Patient knows to alert the office of 4 or more loose stools above baseline. Dry Skin/Rash: Educated patient to keep skin moisturized while on Tagrisso with non-scented lotion twice daily. Patient knows to alert office for further assistance if they develop a rash.  Nail changes: We discussed that if patient develops nail changes that a 1:1 solution of white vinegar and water soak for 15 minutes a day can help, but patient should alert the office if this occurs.   Reviewed with patient importance of keeping a medication schedule and plan for any missed doses. No barriers to medication adherence identified.  Medication reconciliation performed and medication/allergy list updated. Current medication list in Epic reviewed, no relevant/significant DDIs with Tagrisso identified.  All questions  answered.  Patient agreement for treatment documented in MD note on 01/14/24.  Ms. Sorber voiced understanding and appreciation.   Medication education handout given to patient. Patient knows to call the office with questions or concerns. Oral Chemotherapy Clinic phone number provided to patient.   Sherry Ruffing, PharmD, BCPS, BCOP Hematology/Oncology Clinical Pharmacist [facility] Oral Chemotherapy Navigation Clinics [phone] 01/15/2024 8:48 AM

## 2024-01-16 ENCOUNTER — Telehealth: Payer: Self-pay | Admitting: Pharmacy Technician

## 2024-01-16 ENCOUNTER — Other Ambulatory Visit: Payer: Self-pay

## 2024-01-16 ENCOUNTER — Other Ambulatory Visit: Payer: Self-pay | Admitting: Pharmacy Technician

## 2024-01-16 ENCOUNTER — Encounter: Payer: Self-pay | Admitting: Pharmacy Technician

## 2024-01-16 ENCOUNTER — Other Ambulatory Visit (HOSPITAL_COMMUNITY): Payer: Self-pay

## 2024-01-16 DIAGNOSIS — I10 Essential (primary) hypertension: Secondary | ICD-10-CM | POA: Diagnosis not present

## 2024-01-16 DIAGNOSIS — C3490 Malignant neoplasm of unspecified part of unspecified bronchus or lung: Secondary | ICD-10-CM | POA: Diagnosis not present

## 2024-01-16 DIAGNOSIS — Z853 Personal history of malignant neoplasm of breast: Secondary | ICD-10-CM | POA: Diagnosis not present

## 2024-01-16 DIAGNOSIS — K219 Gastro-esophageal reflux disease without esophagitis: Secondary | ICD-10-CM | POA: Diagnosis not present

## 2024-01-16 DIAGNOSIS — E46 Unspecified protein-calorie malnutrition: Secondary | ICD-10-CM | POA: Diagnosis not present

## 2024-01-16 DIAGNOSIS — I350 Nonrheumatic aortic (valve) stenosis: Secondary | ICD-10-CM | POA: Diagnosis not present

## 2024-01-16 NOTE — Telephone Encounter (Signed)
 A user error has taken place: encounter opened in error, closed for administrative reasons.

## 2024-01-16 NOTE — Telephone Encounter (Signed)
 Oral Oncology Patient Advocate Encounter   Began application for assistance for Tagrisso through [employer].   Application will be submitted upon completion of necessary supporting documentation.   [employer] phone number [phone].   I will continue to check the status until final determination.   Omer Jack, CPhT-Adv Oncology Pharmacy Patient Advocate [facility]  Direct Number: [phone]  Fax: [phone]

## 2024-01-16 NOTE — Progress Notes (Signed)
 Oral Chemotherapy Pharmacist Encounter  Patient was counseled under telephone encounter from 01/14/24.  Sherry Ruffing, PharmD, BCPS, BCOP Hematology/Oncology Clinical Pharmacist [facility] Oral Chemotherapy Navigation Clinics [phone] 01/16/2024 1:52 PM

## 2024-01-16 NOTE — Telephone Encounter (Signed)
 Oral Oncology Patient Advocate Encounter  Received notification that the request for prior authorization for Tagrisso has been denied due to The reason we denied your request is because the following prior authorization criteria were not met: One of the following: 1) Diagnosis of metastatic NSCLC, AND; A) One of the following: i) Patient has known active epidermal growth factor.     An urgent appeal for the prior authorization denial of Ashley Pratt has been started by the pharmacist on 01/16/24.   This encounter will continue to be updated until final appeal determination.    Omer Jack, CPhT-Adv Oncology Pharmacy Patient Advocate [facility]  Direct Number: [phone]  Fax: [phone]

## 2024-01-16 NOTE — Telephone Encounter (Signed)
 Oral Oncology Patient Advocate Encounter  PA approved. Case will be closed until further notice.  Omer Jack, CPhT-Adv Oncology Pharmacy Patient Advocate [facility]  Direct Number: [phone]  Fax: [phone]

## 2024-01-16 NOTE — Telephone Encounter (Signed)
 Oral Oncology Pharmacist Encounter   Prior Authorization for Ashley Pratt has been denied.     Urgent appeal letter faxed to [facility] [employer] with supporting documentation. Appeal faxed to: [phone].    Oral Oncology Clinic will continue to follow.    Sherry Ruffing, PharmD, BCPS, BCOP Hematology/Oncology Clinical Pharmacist [facility] Oral Chemotherapy Navigation Clinics [phone] 01/16/2024 9:18 AM

## 2024-01-16 NOTE — Progress Notes (Signed)
 Specialty Pharmacy Initial Fill Coordination Note  Eddye Broxterman is a 73 y.o. female contacted today regarding refills of specialty medication(s) Osimertinib Mesylate Edgar Frisk)   Patient requested Daryll Drown at [facility] at [place]  on 01/17/24   Medication will be filled on 01/16/24.   Patient is aware of $12.15 copayment.

## 2024-01-16 NOTE — Telephone Encounter (Signed)
 Oral Oncology Patient Advocate Encounter  Prior Authorization for Ashley Pratt has been approved.    PA# [id number] Effective dates: 01/16/24 through 01/15/25  Patients co-pay is $12.15.    Ashley Pratt, CPhT-Adv Oncology Pharmacy Patient Advocate [facility]  Direct Number: [phone]  Fax: [phone]

## 2024-01-16 NOTE — Telephone Encounter (Signed)
 Oral Oncology Pharmacist Encounter  Appeal for Tagrisso (osimertinib) has been overturned and is now approved. Patient will fill Tagrisso through the [facility].  Tagrisso start date: 01/17/24  Sherry Ruffing, PharmD, BCPS, BCOP Hematology/Oncology Clinical Pharmacist [facility] Oral Chemotherapy Navigation Clinics [phone] 01/16/2024 1:05 PM

## 2024-01-17 ENCOUNTER — Other Ambulatory Visit (HOSPITAL_COMMUNITY): Payer: Self-pay

## 2024-01-27 NOTE — Progress Notes (Unsigned)
 [facility] OFFICE PROGRESS NOTE  Si Gaul, MD [address] [place] [address]  DIAGNOSIS: stage IIIb (T1c, N3, M0) non-small cell lung cancer, adenocarcinoma presented with right middle lobe lung nodule in addition to bilateral hilar and mediastinal lymphadenopathy diagnosed in December 2024.    Biomarker Findings HRD signature - HRDsig Negative Microsatellite status - MS-Stable Tumor Mutational Burden - 2 Muts/Mb Genomic Findings For a complete list of the genes assayed, please refer to the Appendix. EGFR G719C, E709V, amplification CDKN2A loss - equivocal? MTAP loss - equivocal? CDKN2B loss - equivocal? FGF23 R187W - subclonal? TP53 R248L 7 Disease relevant genes with no reportable alterations: ALK, BRAF, ERBB2, KRAS, MET, RET, ROS1   PDL1 Expression 30%  PRIOR THERAPY: Concurrent chemoradiation with weekly carboplatin for AUC of 2 and paclitaxel 45 Mg/M2. First dose October 29, 2023. Status post 6 cycles.   CURRENT THERAPY: Consolidation treatment with Tagrisso 80 mg p.o. daily. First dose on 01/18/24  INTERVAL HISTORY: Ashley Pratt 73 y.o. female returns to the clinic today for a follow-up visit accompanied by her husband. The patient recently completed concurrent chemoradiation.  She tolerated this poorly with decreased appetite, dizziness, and weakness but she did have improvement after completion of treatment.  She also had improvement in her dysphagia.  She was seen by Dr. Arbutus Ped on 01/14/2024 to review her scan and discussed the next steps.  She is positive for EGFR therefore she was started on treatment with Tagrisso.  Her first dose of this was on 01/18/24.  He has not noticed any appreciable adverse side effects since starting Tagrisso.  Her only concern today is related to dysuria.  This started 3 weeks ago which is prior to starting Tagrisso.  She has never had a urinary tract infection in the past.  She reports that she has  increased urinary frequency, dysuria, malodorous urine, and cloudy urine.  Denies any associated fevers, nausea, or abdominal pain.  Her other concern is related to her Port-A-Cath.  For the last 2 appointments they have not been able to obtain any blood return.  It does not sound like any clot busting agents for tried.  She is wondering if she even needs her Port-A-Cath.  Today she denies any fever, chills, or night sweats.  Overall her appetite is still not great since completing chemoradiation but her weight is the same.  She is drinking boost twice a day.  She reports her breathing is the same.  They state that she can walk 10 to 15 minutes without getting short of breath.  She denies any significant cough, hemoptysis, or chest pain.  Denies any nausea, vomiting, diarrhea, or constipation.  Denies any rashes or skin changes.  Denies any headache or visual changes.  She is here today for repeat blood work and toxicity check.    MEDICAL HISTORY: Past Medical History:  Diagnosis Date   Aortic stenosis    Arthritis    Breast cancer (HCC)    Chronic back pain    Colon polyps    GERD (gastroesophageal reflux disease)    Hypertension    Malignant neoplasm of right female breast (HCC)    unspecified site of breast   Osteoarthritis    of the knee left worse than right   Prediabetes     ALLERGIES:  is allergic to aspirin, gabapentin, losartan potassium-hctz, and penicillins.  MEDICATIONS:  Current Outpatient Medications  Medication Sig Dispense Refill   lidocaine-prilocaine (EMLA) cream Apply 1 Application topically  as needed. 30 g 2   losartan (COZAAR) 100 MG tablet Take 100 mg by mouth daily.     magnesium gluconate (MAGONATE) 500 MG tablet Take 500 mg by mouth 2 (two) times daily.     Multiple Vitamin (MULTIVITAMIN) capsule Take 1 capsule by mouth daily.     osimertinib mesylate (TAGRISSO) 80 MG tablet Take 1 tablet (80 mg total) by mouth daily. 30 tablet 2   pantoprazole (PROTONIX)  20 MG tablet Take 20 mg by mouth daily.     No current facility-administered medications for this visit.    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   BREAST LUMPECTOMY WITH NEEDLE LOCALIZATION Right 05/11/2013   Procedure: RIGHT BREAST NEEDLE LOCALIZATION  LUMPECTOMY;  Surgeon: Almond Lint, MD;  Location: [facility];  Service: General;  Laterality: Right;   BREAST LUMPECTOMY WITH RADIOACTIVE SEED LOCALIZATION Right 08/02/2020   Procedure: RIGHT BREAST LUMPECTOMY WITH RADIOACTIVE SEED LOCALIZATION;  Surgeon: Almond Lint, MD;  Location: [facility];  Service: General;  Laterality: Right;  RNFA   BRONCHIAL BIOPSY  10/07/2023   Procedure: BRONCHIAL BIOPSIES;  Surgeon: Leslye Peer, MD;  Location: [facility];  Service: Pulmonary;;   BRONCHIAL BRUSHINGS  10/07/2023   Procedure: BRONCHIAL BRUSHINGS;  Surgeon: Leslye Peer, MD;  Location: [facility] END[facility]COPY;  Service: Pulmonary;;   BRONCHIAL NEEDLE ASPIRATION BIOPSY  10/07/2023   Procedure: BRONCHIAL NEEDLE ASPIRATION BIOPSIES;  Surgeon: Leslye Peer, MD;  Location: [facility] ENDOSCOPY;  Service: Pulmonary;;   CHOLECYSTECTOMY     COLONOSCOPY     FOOT OSTEOTOMY     both  feet   IR IMAGING GUIDED PORT INSERTION  11/07/2023   TONSILLECTOMY     VIDEO BRONCHOSCOPY WITH ENDOBRONCHIAL ULTRASOUND N/A 10/07/2023   Procedure: VIDEO BRONCHOSCOPY WITH ENDOBRONCHIAL ULTRASOUND;  Surgeon: Leslye Peer, MD;  Location: [facility] ENDOSCOPY;  Service: Pulmonary;  Laterality: N/A;    REVIEW OF SYSTEMS:   Review of Systems  Constitutional: Stable fatigue and decreased appetite.  Negative for chills, fatigue, fever and unexpected weight change.  HENT: Negative for mouth sores, nosebleeds, sore throat and trouble swallowing.   Eyes: Negative for eye problems and icterus.   Respiratory: Negative for cough, hemoptysis, shortness of breath and wheezing.   Cardiovascular: Negative for chest pain and leg swelling.  Gastrointestinal: Negative for abdominal pain, constipation, diarrhea, nausea and vomiting.  Genitourinary: Positive for dysuria, malodorous urine, and cloudy urine.  Negative for bladder incontinence and hematuria.   Musculoskeletal: Positive for occasional upper and lower back pain when sitting too long.  Negative for  gait problem, neck pain and neck stiffness.  Skin: Negative for itching and rash.  Neurological: Negative for dizziness, extremity weakness, gait problem, headaches, light-headedness and seizures.  Hematological: Negative for adenopathy. Does not bruise/bleed easily.  Psychiatric/Behavioral: Negative for confusion, depression and sleep disturbance. The patient is not nervous/anxious.     PHYSICAL EXAMINATION:  Blood pressure (!) 159/88, pulse (!) 101, temperature (!) 97.2 F (36.2 C), temperature source Temporal, resp. rate 16, weight 120 lb 11.2 oz (54.7 kg), SpO2 100%.  ECOG PERFORMANCE STATUS: 1  Physical Exam  Constitutional: Oriented to person, place, and time and well-developed, well-nourished, and in no distress. HENT:  Head: Normocephalic and atraumatic.  Mouth/Throat: Oropharynx is clear and moist. No oropharyngeal exudate.  Eyes:  Conjunctivae are normal. Right eye exhibits no discharge. Left eye exhibits no discharge. No scleral icterus.  Neck: Normal range of motion. Neck supple.  Cardiovascular: Normal rate, regular rhythm, normal heart sounds and intact distal pulses.   Pulmonary/Chest: Effort normal.  Murmur noted on exam.  No respiratory distress. No wheezes. No rales.  Abdominal: Soft. Bowel sounds are normal. Exhibits no distension and no mass. There is no tenderness.  Musculoskeletal: Normal range of motion. Exhibits no edema.  Lymphadenopathy:    No cervical adenopathy.  Neurological: Alert and oriented to  person, place, and time. Exhibits normal muscle tone. Gait normal. Coordination normal.  Skin: Skin is warm and dry. No rash noted. Not diaphoretic. No erythema. No pallor.  Psychiatric: Mood, memory and judgment normal.  Vitals reviewed.  LABORATORY DATA: Lab Results  Component Value Date   WBC 6.4 01/30/2024   HGB 10.8 (L) 01/30/2024   HCT 32.2 (L) 01/30/2024   MCV 76.8 (L) 01/30/2024   PLT 221 01/30/2024      Chemistry      Component Value Date/Time   NA 130 (L) 01/30/2024 1033   NA 139 10/17/2015 1322   K 4.1 01/30/2024 1033   K 4.1 10/17/2015 1322   CL 95 (L) 01/30/2024 1033   CO2 30 01/30/2024 1033   CO2 28 10/17/2015 1322   BUN 17 01/30/2024 1033   BUN 14.5 10/17/2015 1322   CREATININE 0.78 01/30/2024 1033   CREATININE 0.87 04/10/2016 1540   CREATININE 0.9 10/17/2015 1322      Component Value Date/Time   CALCIUM 9.4 01/30/2024 1033   CALCIUM 10.1 10/17/2015 1322   ALKPHOS 64 01/30/2024 1033   ALKPHOS 103 10/17/2015 1322   AST 21 01/30/2024 1033   AST 24 10/17/2015 1322   ALT 28 01/30/2024 1033   ALT 32 10/17/2015 1322   BILITOT 0.4 01/30/2024 1033   BILITOT 0.33 10/17/2015 1322       RADIOGRAPHIC STUDIES:  CT Chest W Contrast Result Date: 01/14/2024 CLINICAL DATA:  Staging non-small-cell lung cancer. * Tracking Code: BO * EXAM: CT CHEST WITH CONTRAST TECHNIQUE: Multidetector CT imaging of the chest was performed during intravenous contrast administration. RADIATION DOSE REDUCTION: This exam was performed according to the departmental dose-optimization program which includes automated exposure control, adjustment of the mA and/or kV according to patient size and/or use of iterative reconstruction technique. CONTRAST:  75mL OMNIPAQUE IOHEXOL 300 MG/ML  SOLN COMPARISON:  CT 10/17/2023. FINDINGS: Cardiovascular: Heart is slightly enlarged. Trace pericardial fluid. Thoracic aorta is normal course and caliber with partially calcified atherosclerotic plaque. Pulsation  artifact along the ascending aorta. Right IJ chest port in place with tip extending to the central SVC above the right atrium. 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 Mediastinum/Nodes: Patulous esophagus with luminal air and fluid. There are areas of wall thickening. Particular along the mid to lower thoracic esophagus. The wall thickening is new from previous. Please correlate with symptoms of esophagitis or other process. No specific abnormal lymph node enlargement seen in the axillary regions. There are some small hilar nodes bilaterally which are less than a cm in short axis and not pathologic by size criteria. Small mediastinal nodes are also identified. Specific lesion to the right of the carina had a short axis on the prior of 8 mm. Today on series 2, image 49 this measures 9 mm in short axis. Subcarinal node is larger today measuring 9 mm in short axis on series 2,  image 53 and previously the would have measured when the 6 mm. There is some edema along the mediastinum. Lungs/Pleura: Breathing motion seen throughout the examination. No pneumothorax or effusion. Masslike area with spiculation seen right perihilar along the posterior aspect of the middle lobe is again seen. Previously this measured 2.3 x 2.1 cm. Today when measured in the same fashion taking into account the motion lesion would measure approximately 2.1 by 1.5 cm. Smaller overall from previous. Increasing bandlike changes. Some bronchiectasis seen along the inferior aspect of the middle lobe more peripherally. No new dominant mass identified. Upper Abdomen: Fatty liver identified. Previous cholecystectomy. The adrenal glands are preserved. Musculoskeletal: Scattered moderate degenerative changes along the spine with bridging osteophytes. IMPRESSION: Slight decrease in spiculated masslike area right perihilar in the middle lobe. Increasing wall thickening along the esophagus which is patulous. There is also  mediastinal stranding and some prominent nodes. If you have increased. Please correlate for symptomatology of esophagitis and recommend follow up evaluation. Cardiomegaly. Aortic Atherosclerosis (ICD10-I70.0). Electronically Signed   By: Karen Kays M.D.   On: 01/14/2024 11:05     ASSESSMENT/PLAN:  This is a very pleasant 73 year old female followed by the clinic for stage 3B non-small cell lung cancer (adenocarcinoma) in December 2024, presents for the initiation of her first treatment with chemo and radiation. The patient's molecular markers were tested, revealing an uncommon EGFR mutation and a PD-L1 exhalation of 30%, both of which could be utilized for future treatment.    Completed 6 cycles of concurrent chemoradiation with weekly carboplatin for an AUC of 2 and paclitaxel 45 mg/m.  The first dose was on 10/29/2023.  She was last seen by Dr. Arbutus Ped on 01/14/2024.  She is positive for EGFR mutation.  Therefore Dr. Arbutus Ped recommended initiation of Tagrisso 80 mg p.o. daily.  He started this on 01/17/2024 and thus far has been tolerating it well.    Labs were reviewed.  Recommend that she continue on the same treatment same dose.  It sounds like she has a urinary tract infection.  We will arrange for a urinalysis and culture to be performed today and I will call her and send her antibiotics to her pharmacy if positive for infection.  Regarding her port I would recommend initial evaluation with initiation of protocol with clot busting agent at her next appointment.  I have written the appointment note to make this a longer appointment anticipating she likely will need to have this administered.  If despite the protocol and no blood return is obtained, then I would recommend IR evaluation with a dye study.  If there are any concerns with the Port-A-Cath itself that are not correctable, then we can consider removing it.  However I would not recommend removing the Port-A-Cath at this time should the  patient have disease recurrence in the future and need her port.   Will see her back for follow-up visit in 2 weeks for evaluation and repeat blood work.  The patient mentions she sometimes has upper and lower back pain.  Her scan does show moderate arthritis in her spine.  I let her know Tylenol would be preferred for pain and she can always use Salonpas patches topically.  The patient was advised to call immediately if she has any concerning symptoms in the interval. The patient voices understanding of current disease status and treatment options and is in agreement with the current care plan. All questions were answered. The patient knows to call the clinic  with any problems, questions or concerns. We can certainly see the patient much sooner if necessary      Orders Placed This Encounter  Procedures   Urine Culture    Standing Status:   Future    Number of Occurrences:   1    Expected Date:   01/30/2024    Expiration Date:   01/29/2025   Urinalysis, Complete w Microscopic    Standing Status:   Future    Number of Occurrences:   1    Expected Date:   01/30/2024    Expiration Date:   01/29/2025   CBC with Differential (Cancer Center Only)    Standing Status:   Future    Expected Date:   02/13/2024    Expiration Date:   01/29/2025   CMP (Cancer Center only)    Standing Status:   Future    Expected Date:   02/13/2024    Expiration Date:   01/29/2025      The total time spent in the appointment was 20-29 minutes  Allean Montfort L Havish Petties, PA-C 01/30/24

## 2024-01-30 ENCOUNTER — Inpatient Hospital Stay

## 2024-01-30 ENCOUNTER — Inpatient Hospital Stay: Attending: Hematology

## 2024-01-30 ENCOUNTER — Other Ambulatory Visit: Payer: Self-pay | Admitting: Physician Assistant

## 2024-01-30 ENCOUNTER — Inpatient Hospital Stay (HOSPITAL_BASED_OUTPATIENT_CLINIC_OR_DEPARTMENT_OTHER): Admitting: Physician Assistant

## 2024-01-30 VITALS — BP 159/88 | HR 101 | Temp 97.2°F | Resp 16 | Wt 120.7 lb

## 2024-01-30 DIAGNOSIS — Z9221 Personal history of antineoplastic chemotherapy: Secondary | ICD-10-CM | POA: Insufficient documentation

## 2024-01-30 DIAGNOSIS — C342 Malignant neoplasm of middle lobe, bronchus or lung: Secondary | ICD-10-CM

## 2024-01-30 DIAGNOSIS — C3490 Malignant neoplasm of unspecified part of unspecified bronchus or lung: Secondary | ICD-10-CM | POA: Diagnosis not present

## 2024-01-30 DIAGNOSIS — R300 Dysuria: Secondary | ICD-10-CM | POA: Diagnosis not present

## 2024-01-30 DIAGNOSIS — Z79899 Other long term (current) drug therapy: Secondary | ICD-10-CM | POA: Diagnosis not present

## 2024-01-30 DIAGNOSIS — Z923 Personal history of irradiation: Secondary | ICD-10-CM | POA: Insufficient documentation

## 2024-01-30 LAB — CMP (CANCER CENTER ONLY)
ALT: 28 U/L (ref 0–44)
AST: 21 U/L (ref 15–41)
Albumin: 3.9 g/dL (ref 3.5–5.0)
Alkaline Phosphatase: 64 U/L (ref 38–126)
Anion gap: 5 (ref 5–15)
BUN: 17 mg/dL (ref 8–23)
CO2: 30 mmol/L (ref 22–32)
Calcium: 9.4 mg/dL (ref 8.9–10.3)
Chloride: 95 mmol/L — ABNORMAL LOW (ref 98–111)
Creatinine: 0.78 mg/dL (ref 0.44–1.00)
GFR, Estimated: >60 mL/min (ref >60)
Glucose, Bld: 124 mg/dL — ABNORMAL HIGH (ref 70–99)
Potassium: 4.1 mmol/L (ref 3.5–5.1)
Sodium: 130 mmol/L — ABNORMAL LOW (ref 135–145)
Total Bilirubin: 0.4 mg/dL (ref 0.0–1.2)
Total Protein: 7 g/dL (ref 6.5–8.1)

## 2024-01-30 LAB — CBC WITH DIFFERENTIAL (CANCER CENTER ONLY)
Abs Immature Granulocytes: 0.02 10*3/uL (ref 0.00–0.07)
Basophils Absolute: 0 10*3/uL (ref 0.0–0.1)
Basophils Relative: 0 %
Eosinophils Absolute: 0.1 10*3/uL (ref 0.0–0.5)
Eosinophils Relative: 2 %
HCT: 32.2 % — ABNORMAL LOW (ref 36.0–46.0)
Hemoglobin: 10.8 g/dL — ABNORMAL LOW (ref 12.0–15.0)
Immature Granulocytes: 0 %
Lymphocytes Relative: 16 %
Lymphs Abs: 1 10*3/uL (ref 0.7–4.0)
MCH: 25.8 pg — ABNORMAL LOW (ref 26.0–34.0)
MCHC: 33.5 g/dL (ref 30.0–36.0)
MCV: 76.8 fL — ABNORMAL LOW (ref 80.0–100.0)
Monocytes Absolute: 0.6 10*3/uL (ref 0.1–1.0)
Monocytes Relative: 9 %
Neutro Abs: 4.7 10*3/uL (ref 1.7–7.7)
Neutrophils Relative %: 73 %
Platelet Count: 221 10*3/uL (ref 150–400)
RBC: 4.19 MIL/uL (ref 3.87–5.11)
RDW: 16.4 % — ABNORMAL HIGH (ref 11.5–15.5)
WBC Count: 6.4 10*3/uL (ref 4.0–10.5)
nRBC: 0 % (ref 0.0–0.2)

## 2024-01-30 LAB — URINALYSIS, COMPLETE (UACMP) WITH MICROSCOPIC
Bilirubin Urine: NEGATIVE
Glucose, UA: NEGATIVE mg/dL
Hgb urine dipstick: NEGATIVE
Ketones, ur: NEGATIVE mg/dL
Nitrite: NEGATIVE
Protein, ur: NEGATIVE mg/dL
Specific Gravity, Urine: 1.006 (ref 1.005–1.030)
pH: 7 (ref 5.0–8.0)

## 2024-01-30 MED ORDER — HEPARIN SOD (PORK) LOCK FLUSH 100 UNIT/ML IV SOLN
500.0000 [IU] | Freq: Once | INTRAVENOUS | Status: DC
Start: 2024-01-30 — End: 2024-01-30

## 2024-01-30 MED ORDER — SODIUM CHLORIDE 0.9% FLUSH
10.0000 mL | Freq: Once | INTRAVENOUS | Status: AC
  Administered 2024-01-30: 10 mL

## 2024-01-30 MED ORDER — SULFAMETHOXAZOLE-TRIMETHOPRIM 800-160 MG PO TABS
1.0000 | ORAL_TABLET | Freq: Two times a day (BID) | ORAL | 0 refills | Status: DC
Start: 2024-01-30 — End: 2024-02-12

## 2024-01-30 NOTE — Progress Notes (Signed)
 Lab sticker printed for med onc nurse.

## 2024-01-31 ENCOUNTER — Other Ambulatory Visit: Payer: Self-pay

## 2024-01-31 ENCOUNTER — Telehealth: Payer: Self-pay

## 2024-01-31 NOTE — Telephone Encounter (Signed)
 Spoke with patients spouse about urinalysis results. Per Cassie, PA- patient likely has a UTI. Antibiotics sent to pharmacy.  Spouse verbalized understanding.

## 2024-02-01 LAB — URINE CULTURE: Culture: 60000 — AB

## 2024-02-04 ENCOUNTER — Other Ambulatory Visit (HOSPITAL_COMMUNITY): Payer: Self-pay

## 2024-02-04 NOTE — Progress Notes (Signed)
 Specialty Pharmacy Ongoing Clinical Assessment Note  I spoke with the patient's husband. Ashley Pratt is a 73 y.o. female who is being followed by the specialty pharmacy service for RxSp Oncology   Patient's specialty medication(s) reviewed today: Osimertinib Mesylate (TAGRISSO)   Missed doses in the last 4 weeks: 0   Patient/Caregiver did not have any additional questions or concerns.   Therapeutic benefit summary: Patient is achieving benefit   Adverse events/side effects summary: No adverse events/side effects   Patient's therapy is appropriate to: Continue    Goals Addressed             This Visit's Progress    Slow Disease Progression   No change    Patient is initiating therapy. Patient will maintain adherence.         Follow up:  3 months  Servando Snare Specialty Pharmacist

## 2024-02-04 NOTE — Progress Notes (Signed)
 Specialty Pharmacy Refill Coordination Note  Ashley Pratt is a 73 y.o. female contacted today regarding refills of specialty medication(s) Osimertinib Mesylate Ashley Pratt)   Patient requested Ashley Pratt at [facility] at [place] date: 02/12/24   Medication will be filled on 02/11/24.

## 2024-02-05 ENCOUNTER — Other Ambulatory Visit: Payer: Self-pay

## 2024-02-07 NOTE — Progress Notes (Signed)
 [facility] OFFICE PROGRESS NOTE  Marlene Simas, MD [address] [place] [address]  DIAGNOSIS: stage IIIb (T1c, N3, M0) non-small cell lung cancer, adenocarcinoma presented with right middle lobe lung nodule in addition to bilateral hilar and mediastinal lymphadenopathy diagnosed in December 2024.    Biomarker Findings HRD signature - HRDsig Negative Microsatellite status - MS-Stable Tumor Mutational Burden - 2 Muts/Mb Genomic Findings For a complete list of the genes assayed, please refer to the Appendix. EGFR G719C, E709V, amplification CDKN2A loss - equivocal? MTAP loss - equivocal? CDKN2B loss - equivocal? FGF23 R187W - subclonal? TP53 R248L 7 Disease relevant genes with no reportable alterations: ALK, BRAF, ERBB2, KRAS, MET, RET, ROS1   PDL1 Expression 30%  PRIOR THERAPY: Concurrent chemoradiation with weekly carboplatin for AUC of 2 and paclitaxel 45 Mg/M2. First dose October 29, 2023. Status post 6 cycles.   CURRENT THERAPY: Consolidation treatment with Tagrisso 80 mg p.o. daily. First dose on 01/18/24   INTERVAL HISTORY: Ashley Pratt 73 y.o. female returns to the clinic today for a follow-up visit accompanied by her husband. The patient recently completed concurrent chemoradiation.  She tolerated this poorly with decreased appetite, dizziness, and weakness but she did have improvement after completion of treatment.  She also had improvement in her dysphagia.  She was seen by Dr. Marguerita Shih on 01/14/2024 to review her scan and discussed the next steps.  She is positive for EGFR therefore she was started on treatment with Tagrisso.  Her first dose of this was on 01/18/24.  He has not noticed any appreciable adverse side effects since starting Tagrisso.   At her last appointment, she was endorsing dysuria and was found to have a UTI. She was treated with antibiotics. Her dysuria has resolved  Her last 2 appointments, she had not had any  blood return from her Port-A-Cath.  Therefore Cathflo was administered today and we will check this.  If there is no blood return we we will send her to interventional radiology for dye study.  Otherwise the patient denies any major changes in her health.  She tells me that she sometimes has mild back pain in her shoulder and mid back.  She denies any traumas, falls, or injuries.  Denies any heavy lifting.  She has not tried taking anything for this.  The pain is not associated with any certain working factors such as positioning, movement, activity, or breathing.   She tells me she sometimes has mild chest congestion on and off with a mild dry cough but is not persistent.  He denies any nasal congestion or allergies.  She reports her shortness of breath is the same.  She denies any hemoptysis or chest pain.  She denies any nausea, vomiting, diarrhea, or constipation.  She denies any rashes except for dry skin.    She is here today for repeat blood work and toxicity check.   MEDICAL HISTORY: Past Medical History:  Diagnosis Date   Aortic stenosis    Arthritis    Breast cancer (HCC)    Chronic back pain    Colon polyps    GERD (gastroesophageal reflux disease)    Hypertension    Malignant neoplasm of right female breast (HCC)    unspecified site of breast   Osteoarthritis    of the knee left worse than right   Prediabetes     ALLERGIES:  is allergic to aspirin, gabapentin, losartan potassium-hctz, and penicillins.  MEDICATIONS:  Current Outpatient Medications  Medication  Sig Dispense Refill   lidocaine-prilocaine (EMLA) cream Apply 1 Application topically as needed. 30 g 2   losartan (COZAAR) 100 MG tablet Take 100 mg by mouth daily.     magnesium gluconate (MAGONATE) 500 MG tablet Take 500 mg by mouth 2 (two) times daily.     Multiple Vitamin (MULTIVITAMIN) capsule Take 1 capsule by mouth daily.     osimertinib mesylate (TAGRISSO) 80 MG tablet Take 1 tablet (80 mg total) by mouth  daily. 30 tablet 2   No current facility-administered medications for this visit.    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   BREAST LUMPECTOMY WITH NEEDLE LOCALIZATION Right 05/11/2013   Procedure: RIGHT BREAST NEEDLE LOCALIZATION  LUMPECTOMY;  Surgeon: Lockie Rima, MD;  Location: [facility];  Service: General;  Laterality: Right;   BREAST LUMPECTOMY WITH RADIOACTIVE SEED LOCALIZATION Right 08/02/2020   Procedure: RIGHT BREAST LUMPECTOMY WITH RADIOACTIVE SEED LOCALIZATION;  Surgeon: Lockie Rima, MD;  Location: [facility];  Service: General;  Laterality: Right;  RNFA   BRONCHIAL BIOPSY  10/07/2023   Procedure: BRONCHIAL BIOPSIES;  Surgeon: Denson Flake, MD;  Location: [facility] ENDOSCOPY;  Service: Pulmonary;;   BRONCHIAL BRUSHINGS  10/07/2023   Procedure: BRONCHIAL BRUSHINGS;  Surgeon: Denson Flake, MD;  Location: [facility] ENDOSCOPY;  Service: Pulmonary;;   BRONCHIAL NEEDLE ASPIRATION BIOPSY  10/07/2023   Procedure: BRONCHIAL NEEDLE ASPIRATION BIOPSIES;  Surgeon: Denson Flake, MD;  Location: [facility] ENDOSCOPY;  Service: Pulmonary;;   CHOLECYSTECTOMY     COLONOSCOPY     FOOT OSTEOTOMY     both  feet   IR IMAGING GUIDED PORT INSERTION  11/07/2023   TONSILLECTOMY     VIDEO BRONCHOSCOPY WITH ENDOBRONCHIAL ULTRASOUND N/A 10/07/2023   Procedure: VIDEO BRONCHOSCOPY WITH ENDOBRONCHIAL ULTRASOUND;  Surgeon: Denson Flake, MD;  Location: [facility] ENDOSCOPY;  Service: Pulmonary;  Laterality: N/A;    REVIEW OF SYSTEMS:   Constitutional: Stable fatigue.  Negative for chills, fatigue, fever and unexpected weight change.  HENT: Negative for mouth sores, nosebleeds, sore throat and trouble swallowing.   Eyes: Negative for eye problems and icterus.  Respiratory: Positive for mild intermittent cough. Stable mild  occasional dyspnea on exertion. Negative for hemoptysis and wheezing.   Cardiovascular: Negative for chest pain and leg swelling.  Gastrointestinal: Negative for abdominal pain, constipation, diarrhea, nausea and vomiting.  Genitourinary: Negative for bladder incontinence, dysuria, and hematuria.   Musculoskeletal: Positive for occasional upper and lower back pain.  Negative for  gait problem, neck pain and neck stiffness.  Skin: Negative for itching and rash.  Neurological: Negative for dizziness, extremity weakness, gait problem, headaches, light-headedness and seizures.  Hematological: Negative for adenopathy. Does not bruise/bleed easily.  Psychiatric/Behavioral: Negative for confusion, depression and sleep disturbance. The patient is not nervous/anxious.       PHYSICAL EXAMINATION:  Blood pressure (!) 149/67, pulse 79, temperature (!) 97.3 F (36.3 C), temperature source Temporal, resp. rate 16, weight 122 lb 6.4 oz (55.5 kg), SpO2 100%.  ECOG PERFORMANCE STATUS: 1  Physical Exam  Constitutional: Oriented to person, place, and time and well-developed, well-nourished, and in no distress.  HENT:  Head: Normocephalic and atraumatic.  Mouth/Throat: Oropharynx is clear and moist. No oropharyngeal exudate.  Eyes: Conjunctivae are normal. Right eye exhibits no discharge. Left eye exhibits no discharge.  No scleral icterus.  Neck: Normal range of motion. Neck supple.  Cardiovascular: Normal rate, regular rhythm, murmur noted and intact distal pulses.   Pulmonary/Chest: Effort normal and breath sounds normal. No respiratory distress. No wheezes. No rales.  Abdominal: Soft. Bowel sounds are normal. Exhibits no distension and no mass. There is no tenderness.  Musculoskeletal: Positive for tenderness over the upper back. Normal range of motion. Exhibits no edema.  Lymphadenopathy:    No cervical adenopathy.  Neurological: Alert and oriented to person, place, and time. Exhibits normal muscle  tone. Gait normal. Coordination normal.  Skin: Skin is warm and dry. No rash noted. Not diaphoretic. No erythema. No pallor.  Psychiatric: Mood, memory and judgment normal.  Vitals reviewed.  LABORATORY DATA: Lab Results  Component Value Date   WBC 4.0 02/12/2024   HGB 10.2 (L) 02/12/2024   HCT 30.2 (L) 02/12/2024   MCV 76.1 (L) 02/12/2024   PLT 144 (L) 02/12/2024      Chemistry      Component Value Date/Time   NA 130 (L) 01/30/2024 1033   NA 139 10/17/2015 1322   K 4.1 01/30/2024 1033   K 4.1 10/17/2015 1322   CL 95 (L) 01/30/2024 1033   CO2 30 01/30/2024 1033   CO2 28 10/17/2015 1322   BUN 17 01/30/2024 1033   BUN 14.5 10/17/2015 1322   CREATININE 0.78 01/30/2024 1033   CREATININE 0.87 04/10/2016 1540   CREATININE 0.9 10/17/2015 1322      Component Value Date/Time   CALCIUM 9.4 01/30/2024 1033   CALCIUM 10.1 10/17/2015 1322   ALKPHOS 64 01/30/2024 1033   ALKPHOS 103 10/17/2015 1322   AST 21 01/30/2024 1033   AST 24 10/17/2015 1322   ALT 28 01/30/2024 1033   ALT 32 10/17/2015 1322   BILITOT 0.4 01/30/2024 1033   BILITOT 0.33 10/17/2015 1322       RADIOGRAPHIC STUDIES:  No results found.   ASSESSMENT/PLAN:  This is a very pleasant 73 year old female followed by the clinic for stage 3B non-small cell lung cancer (adenocarcinoma) in December 2024, presents for the initiation of her first treatment with chemo and radiation. The patient's molecular markers were tested, revealing an uncommon EGFR mutation and a PD-L1 exhalation of 30%, both of which could be utilized for future treatment.    Completed 6 cycles of concurrent chemoradiation with weekly carboplatin for an AUC of 2 and paclitaxel 45 mg/m.  The first dose was on 10/29/2023.   She was last seen by Dr. Marguerita Shih on 01/14/2024.  She is positive for EGFR mutation.  Therefore Dr. Marguerita Shih recommended initiation of Tagrisso 80 mg p.o. daily.  He started this on 01/17/2024 and thus far has been tolerating it well.      Labs were reviewed.  Recommend that she continue on the same treatment same dose.   Arrange for restaging CT scan of the chest prior to her next appointment.  Let her know we will be able to assess her spine and shoulder on her scan for any acute abnormality such as fracture. She denies trauma, heavy lifting, or falls.  She does have some arthritis in her spine according to her scans.  She did have some tenderness to palpation which is consistent with musculoskeletal etiology.  Overall her pain is not constant and she rates it as being mild.  If needed, she can take Tylenol, use heating pads, or use Salonpas patches.   Her Port-A-Cath has not been obtaining any blood return  for the last several appointments.  We arrange for the patient to have Cathflo administered today.  If no blood return, then we will arrange for a dye study.    Will see her back for follow-up visit in 4 weeks for evaluation and repeat blood work.     The patient was advised to call immediately if she has any concerning symptoms in the interval. The patient voices understanding of current disease status and treatment options and is in agreement with the current care plan. All questions were answered. The patient knows to call the clinic with any problems, questions or concerns. We can certainly see the patient much sooner if necessary      Orders Placed This Encounter  Procedures   CT Chest W Contrast    Standing Status:   Future    Expected Date:   03/06/2024    Expiration Date:   02/11/2025    If indicated for the ordered procedure, I authorize the administration of contrast media per Radiology protocol:   Yes    Does the patient have a contrast media/X-ray dye allergy?:   No    Preferred imaging location?:   [facility]     The total time spent in the appointment was 20-29 minutes  Rilley Poulter L Kiahna Banghart, PA-C 02/12/24

## 2024-02-11 ENCOUNTER — Other Ambulatory Visit: Payer: Self-pay

## 2024-02-12 ENCOUNTER — Other Ambulatory Visit: Payer: Self-pay

## 2024-02-12 ENCOUNTER — Inpatient Hospital Stay

## 2024-02-12 ENCOUNTER — Inpatient Hospital Stay: Admitting: Physician Assistant

## 2024-02-12 VITALS — BP 149/67 | HR 79 | Temp 97.3°F | Resp 16 | Wt 122.4 lb

## 2024-02-12 DIAGNOSIS — C342 Malignant neoplasm of middle lobe, bronchus or lung: Secondary | ICD-10-CM | POA: Diagnosis not present

## 2024-02-12 DIAGNOSIS — Z95828 Presence of other vascular implants and grafts: Secondary | ICD-10-CM

## 2024-02-12 DIAGNOSIS — C3490 Malignant neoplasm of unspecified part of unspecified bronchus or lung: Secondary | ICD-10-CM | POA: Diagnosis not present

## 2024-02-12 LAB — CMP (CANCER CENTER ONLY)
ALT: 20 U/L (ref 0–44)
AST: 20 U/L (ref 15–41)
Albumin: 3.9 g/dL (ref 3.5–5.0)
Alkaline Phosphatase: 49 U/L (ref 38–126)
Anion gap: 4 — ABNORMAL LOW (ref 5–15)
BUN: 14 mg/dL (ref 8–23)
CO2: 28 mmol/L (ref 22–32)
Calcium: 9.2 mg/dL (ref 8.9–10.3)
Chloride: 95 mmol/L — ABNORMAL LOW (ref 98–111)
Creatinine: 0.66 mg/dL (ref 0.44–1.00)
GFR, Estimated: >60 mL/min (ref >60)
Glucose, Bld: 123 mg/dL — ABNORMAL HIGH (ref 70–99)
Potassium: 4.1 mmol/L (ref 3.5–5.1)
Sodium: 127 mmol/L — ABNORMAL LOW (ref 135–145)
Total Bilirubin: 0.5 mg/dL (ref 0.0–1.2)
Total Protein: 6.8 g/dL (ref 6.5–8.1)

## 2024-02-12 LAB — CBC WITH DIFFERENTIAL (CANCER CENTER ONLY)
Abs Immature Granulocytes: 0.01 10*3/uL (ref 0.00–0.07)
Basophils Absolute: 0 10*3/uL (ref 0.0–0.1)
Basophils Relative: 0 %
Eosinophils Absolute: 0.1 10*3/uL (ref 0.0–0.5)
Eosinophils Relative: 3 %
HCT: 30.2 % — ABNORMAL LOW (ref 36.0–46.0)
Hemoglobin: 10.2 g/dL — ABNORMAL LOW (ref 12.0–15.0)
Immature Granulocytes: 0 %
Lymphocytes Relative: 22 %
Lymphs Abs: 0.9 10*3/uL (ref 0.7–4.0)
MCH: 25.7 pg — ABNORMAL LOW (ref 26.0–34.0)
MCHC: 33.8 g/dL (ref 30.0–36.0)
MCV: 76.1 fL — ABNORMAL LOW (ref 80.0–100.0)
Monocytes Absolute: 0.4 10*3/uL (ref 0.1–1.0)
Monocytes Relative: 9 %
Neutro Abs: 2.6 10*3/uL (ref 1.7–7.7)
Neutrophils Relative %: 66 %
Platelet Count: 144 10*3/uL — ABNORMAL LOW (ref 150–400)
RBC: 3.97 MIL/uL (ref 3.87–5.11)
RDW: 15.9 % — ABNORMAL HIGH (ref 11.5–15.5)
WBC Count: 4 10*3/uL (ref 4.0–10.5)
nRBC: 0 % (ref 0.0–0.2)

## 2024-02-12 MED ORDER — HEPARIN SOD (PORK) LOCK FLUSH 100 UNIT/ML IV SOLN
500.0000 [IU] | Freq: Once | INTRAVENOUS | Status: AC
  Administered 2024-02-12: 500 [IU]

## 2024-02-12 MED ORDER — ALTEPLASE 2 MG IJ SOLR
2.0000 mg | Freq: Once | INTRAMUSCULAR | Status: AC
Start: 2024-02-12 — End: 2024-02-12
  Administered 2024-02-12: 2 mg
  Filled 2024-02-12: qty 2

## 2024-02-12 MED ORDER — SODIUM CHLORIDE 0.9% FLUSH
10.0000 mL | Freq: Once | INTRAVENOUS | Status: AC
  Administered 2024-02-12: 10 mL

## 2024-02-12 NOTE — Progress Notes (Signed)
 Patient stated that she has been accessed through port a cath twice with no blood return.    Patient received cath flo through port a cath with no blood return after 2 hours. Placed order for dye study.  Informed patient radiology scheduling will call with appt. Patient and husband verbalized understanding.

## 2024-02-13 ENCOUNTER — Other Ambulatory Visit: Payer: Self-pay

## 2024-02-14 ENCOUNTER — Other Ambulatory Visit: Payer: Self-pay

## 2024-02-18 ENCOUNTER — Other Ambulatory Visit: Payer: Self-pay

## 2024-02-20 ENCOUNTER — Ambulatory Visit (HOSPITAL_COMMUNITY)
Admission: RE | Admit: 2024-02-20 | Discharge: 2024-02-20 | Disposition: A | Discharge status: Home / Self Care | Source: Ambulatory Visit | Attending: Internal Medicine | Admitting: Internal Medicine

## 2024-02-20 DIAGNOSIS — Z95828 Presence of other vascular implants and grafts: Secondary | ICD-10-CM

## 2024-02-20 DIAGNOSIS — T82598A Other mechanical complication of other cardiac and vascular devices and implants, initial encounter: Secondary | ICD-10-CM | POA: Diagnosis not present

## 2024-02-20 DIAGNOSIS — Z452 Encounter for adjustment and management of vascular access device: Secondary | ICD-10-CM | POA: Insufficient documentation

## 2024-02-20 HISTORY — PX: IR CV LINE INJECTION: IMG2294

## 2024-02-20 MED ORDER — HEPARIN SOD (PORK) LOCK FLUSH 100 UNIT/ML IV SOLN
INTRAVENOUS | Status: AC
  Filled 2024-02-20: qty 5

## 2024-02-20 MED ORDER — IOHEXOL 300 MG/ML  SOLN
50.0000 mL | Freq: Once | INTRAMUSCULAR | Status: AC | PRN
  Administered 2024-02-20: 10 mL

## 2024-02-20 MED ORDER — HEPARIN SOD (PORK) LOCK FLUSH 100 UNIT/ML IV SOLN
500.0000 [IU] | Freq: Once | INTRAVENOUS | Status: AC
  Administered 2024-02-20: 500 [IU] via INTRAVENOUS

## 2024-03-03 ENCOUNTER — Other Ambulatory Visit: Payer: Self-pay

## 2024-03-05 ENCOUNTER — Other Ambulatory Visit: Payer: Self-pay

## 2024-03-05 ENCOUNTER — Other Ambulatory Visit (HOSPITAL_COMMUNITY): Payer: Self-pay

## 2024-03-05 NOTE — Progress Notes (Signed)
 Specialty Pharmacy Refill Coordination Note  Ashley Pratt is a 73 y.o. female contacted today regarding refills of specialty medication(s) Osimertinib  Mesylate (TAGRISSO )   Spoke with patient's husband  Patient requested Pickup at [facility] at [place] date: 03/06/24   Medication will be filled on 05.08.25.

## 2024-03-05 NOTE — Progress Notes (Signed)
 Clinical Intervention Note  Clinical Intervention Notes: Patient's husband reports wheezing and coughing that are worse at night.  I advised that this could be an issue from the medication, the cancer, or a new onset sickness and needs to be evaluated by the provider.  He was understanding and they have an appointment with the provider tomorrow.   Clinical Intervention Outcomes: Prevention of an adverse drug event   Celinda Collar

## 2024-03-06 ENCOUNTER — Ambulatory Visit (HOSPITAL_COMMUNITY)
Admission: RE | Admit: 2024-03-06 | Discharge: 2024-03-06 | Disposition: A | Discharge status: Home / Self Care | Source: Ambulatory Visit | Attending: Physician Assistant | Admitting: Physician Assistant

## 2024-03-06 DIAGNOSIS — C342 Malignant neoplasm of middle lobe, bronchus or lung: Secondary | ICD-10-CM | POA: Insufficient documentation

## 2024-03-06 DIAGNOSIS — I700 Atherosclerosis of aorta: Secondary | ICD-10-CM | POA: Diagnosis not present

## 2024-03-06 DIAGNOSIS — C3490 Malignant neoplasm of unspecified part of unspecified bronchus or lung: Secondary | ICD-10-CM | POA: Diagnosis not present

## 2024-03-06 DIAGNOSIS — E041 Nontoxic single thyroid nodule: Secondary | ICD-10-CM | POA: Diagnosis not present

## 2024-03-06 MED ORDER — IOHEXOL 300 MG/ML  SOLN
75.0000 mL | Freq: Once | INTRAMUSCULAR | Status: AC | PRN
  Administered 2024-03-06: 75 mL via INTRAVENOUS

## 2024-03-06 MED ORDER — SODIUM CHLORIDE (PF) 0.9 % IJ SOLN
INTRAMUSCULAR | Status: AC
Start: 2024-03-06 — End: ?
  Filled 2024-03-06: qty 50

## 2024-03-06 MED ORDER — HEPARIN SOD (PORK) LOCK FLUSH 100 UNIT/ML IV SOLN
INTRAVENOUS | Status: AC
  Filled 2024-03-06: qty 5

## 2024-03-06 MED ORDER — HEPARIN SOD (PORK) LOCK FLUSH 100 UNIT/ML IV SOLN
500.0000 [IU] | Freq: Once | INTRAVENOUS | Status: AC
  Administered 2024-03-06: 500 [IU] via INTRAVENOUS

## 2024-03-09 ENCOUNTER — Other Ambulatory Visit (HOSPITAL_COMMUNITY): Payer: Self-pay

## 2024-03-10 ENCOUNTER — Telehealth: Payer: Self-pay

## 2024-03-10 ENCOUNTER — Other Ambulatory Visit: Payer: Self-pay | Admitting: Physician Assistant

## 2024-03-10 DIAGNOSIS — J189 Pneumonia, unspecified organism: Secondary | ICD-10-CM

## 2024-03-10 DIAGNOSIS — C3490 Malignant neoplasm of unspecified part of unspecified bronchus or lung: Secondary | ICD-10-CM | POA: Diagnosis not present

## 2024-03-10 MED ORDER — DOXYCYCLINE HYCLATE 100 MG PO TABS: 100.0000 mg | ORAL_TABLET | Freq: Two times a day (BID) | ORAL | 0 refills | Status: AC

## 2024-03-10 MED ORDER — METHYLPREDNISOLONE 4 MG PO TBPK: ORAL_TABLET | ORAL | 0 refills | Status: DC

## 2024-03-10 NOTE — Telephone Encounter (Addendum)
 I spoke with the patient's husband regarding the results of the CT scan. He informed me that the patient is scheduled to see Dr. Bryon Caraway, the PCP, today at 4:30 PM, and he would like to know if the scan results could be reviewed due to the patient's symptoms of a cough. I advised the husband that I can contact [facility] to request the scan to be read. If the scan is reviewed, the results can be faxed to Dr. Markel Silber office as per the patient's request.    The scan has been reviewed, and Dr. Marguerita Shih has interpreted the results. The potential diagnosis is either infectious or drug-induced pneumonia, and a treatment plan has been recommended, including Doxycycline and a Medrol dose pack.  LVM with instructions of medications prescribed.  The patient is seeing Dr. Husain at 4:30 PM today, and any further recommendations can be discussed at that time. Results faxed at [phone] with confirmation.

## 2024-03-11 DIAGNOSIS — J189 Pneumonia, unspecified organism: Secondary | ICD-10-CM | POA: Diagnosis not present

## 2024-03-11 DIAGNOSIS — R0781 Pleurodynia: Secondary | ICD-10-CM | POA: Diagnosis not present

## 2024-03-11 DIAGNOSIS — R1310 Dysphagia, unspecified: Secondary | ICD-10-CM | POA: Diagnosis not present

## 2024-03-11 NOTE — Telephone Encounter (Signed)
 error

## 2024-03-17 ENCOUNTER — Other Ambulatory Visit: Payer: Self-pay | Admitting: Medical Oncology

## 2024-03-17 ENCOUNTER — Encounter: Payer: Self-pay | Admitting: Internal Medicine

## 2024-03-17 DIAGNOSIS — C342 Malignant neoplasm of middle lobe, bronchus or lung: Secondary | ICD-10-CM

## 2024-03-18 ENCOUNTER — Inpatient Hospital Stay

## 2024-03-18 ENCOUNTER — Inpatient Hospital Stay: Attending: Hematology | Admitting: Internal Medicine

## 2024-03-18 VITALS — BP 156/92 | Temp 97.8°F | Resp 16 | Ht 60.0 in | Wt 122.4 lb

## 2024-03-18 DIAGNOSIS — Z79899 Other long term (current) drug therapy: Secondary | ICD-10-CM | POA: Insufficient documentation

## 2024-03-18 DIAGNOSIS — J189 Pneumonia, unspecified organism: Secondary | ICD-10-CM | POA: Diagnosis not present

## 2024-03-18 DIAGNOSIS — I10 Essential (primary) hypertension: Secondary | ICD-10-CM | POA: Diagnosis not present

## 2024-03-18 DIAGNOSIS — C342 Malignant neoplasm of middle lobe, bronchus or lung: Secondary | ICD-10-CM | POA: Diagnosis not present

## 2024-03-18 DIAGNOSIS — D0501 Lobular carcinoma in situ of right breast: Secondary | ICD-10-CM

## 2024-03-18 DIAGNOSIS — K5900 Constipation, unspecified: Secondary | ICD-10-CM | POA: Diagnosis not present

## 2024-03-18 DIAGNOSIS — Z95828 Presence of other vascular implants and grafts: Secondary | ICD-10-CM

## 2024-03-18 LAB — CBC WITH DIFFERENTIAL (CANCER CENTER ONLY)
Abs Immature Granulocytes: 0.03 10*3/uL (ref 0.00–0.07)
Basophils Absolute: 0 10*3/uL (ref 0.0–0.1)
Basophils Relative: 0 %
Eosinophils Absolute: 0.1 10*3/uL (ref 0.0–0.5)
Eosinophils Relative: 1 %
HCT: 34.8 % — ABNORMAL LOW (ref 36.0–46.0)
Hemoglobin: 11.7 g/dL — ABNORMAL LOW (ref 12.0–15.0)
Immature Granulocytes: 0 %
Lymphocytes Relative: 11 %
Lymphs Abs: 1 10*3/uL (ref 0.7–4.0)
MCH: 25.5 pg — ABNORMAL LOW (ref 26.0–34.0)
MCHC: 33.6 g/dL (ref 30.0–36.0)
MCV: 76 fL — ABNORMAL LOW (ref 80.0–100.0)
Monocytes Absolute: 0.8 10*3/uL (ref 0.1–1.0)
Monocytes Relative: 9 %
Neutro Abs: 6.8 10*3/uL (ref 1.7–7.7)
Neutrophils Relative %: 79 %
Platelet Count: 181 10*3/uL (ref 150–400)
RBC: 4.58 MIL/uL (ref 3.87–5.11)
RDW: 15.7 % — ABNORMAL HIGH (ref 11.5–15.5)
WBC Count: 8.7 10*3/uL (ref 4.0–10.5)
nRBC: 0 % (ref 0.0–0.2)

## 2024-03-18 LAB — CMP (CANCER CENTER ONLY)
ALT: 16 U/L (ref 0–44)
AST: 17 U/L (ref 15–41)
Albumin: 4.1 g/dL (ref 3.5–5.0)
Alkaline Phosphatase: 55 U/L (ref 38–126)
Anion gap: 6 (ref 5–15)
BUN: 25 mg/dL — ABNORMAL HIGH (ref 8–23)
CO2: 29 mmol/L (ref 22–32)
Calcium: 9.5 mg/dL (ref 8.9–10.3)
Chloride: 95 mmol/L — ABNORMAL LOW (ref 98–111)
Creatinine: 0.79 mg/dL (ref 0.44–1.00)
GFR, Estimated: >60 mL/min (ref >60)
Glucose, Bld: 99 mg/dL (ref 70–99)
Potassium: 4.3 mmol/L (ref 3.5–5.1)
Sodium: 130 mmol/L — ABNORMAL LOW (ref 135–145)
Total Bilirubin: 0.5 mg/dL (ref 0.0–1.2)
Total Protein: 7 g/dL (ref 6.5–8.1)

## 2024-03-18 MED ORDER — HEPARIN SOD (PORK) LOCK FLUSH 100 UNIT/ML IV SOLN
500.0000 [IU] | Freq: Once | INTRAVENOUS | Status: AC
  Administered 2024-03-18: 500 [IU]

## 2024-03-18 MED ORDER — SODIUM CHLORIDE 0.9% FLUSH
10.0000 mL | Freq: Once | INTRAVENOUS | Status: AC
  Administered 2024-03-18: 10 mL

## 2024-03-18 NOTE — Progress Notes (Signed)
 [facility] [phone]   [phone]  OFFICE PROGRESS NOTE  Marlene Simas, MD [address] [place] [address]  DIAGNOSIS:  stage IIIb (T1c, N3, M0) non-small cell lung cancer, adenocarcinoma presented with right middle lobe lung nodule in addition to bilateral hilar and mediastinal lymphadenopathy diagnosed in December 2024.   Biomarker Findings HRD signature - HRDsig Negative Microsatellite status - MS-Stable Tumor Mutational Burden - 2 Muts/Mb Genomic Findings For a complete list of the genes assayed, please refer to the Appendix. EGFR G719C, E709V, amplification CDKN2A loss - equivocal? MTAP loss - equivocal? CDKN2B loss - equivocal? FGF23 R187W - subclonal? TP53 R248L 7 Disease relevant genes with no reportable alterations: ALK, BRAF, ERBB2, KRAS, MET, RET, ROS1  PDL1 Expression 30%   PRIOR THERAPY: Concurrent chemoradiation with weekly carboplatin  for AUC of 2 and paclitaxel  45 Mg/M2.  First dose October 29, 2023.  Status post 6 cycles.  CURRENT THERAPY: Consolidation treatment with Tagrisso  80 mg p.o. daily.  First dose started January 18, 2024. INTERVAL HISTORY: Ashley Pratt 73 y.o. female returns to the clinic today for follow-up visit accompanied by her husband.  Discussed the use of AI scribe software for clinical note transcription with the patient, who gave verbal consent to proceed.  History of Present Illness   Ashley Pratt is a 73 year old female with stage 3B adenocarcinoma of the lung who presents for evaluation and repeat CT scan for restaging of her disease.  She was diagnosed with stage 3B adenocarcinoma of the lung in December 2024 and has been on consolidation treatment with Tagrisso  80 mg PO daily since January 18, 2024. On Mar 06, 2024, she developed pneumonia following a period of coughing. Initial treatment with amoxicillin was complicated by difficulty swallowing the large pills,  leading to choking. Her son assisted her during this episode. She was subsequently treated with a Medrol  Dosepak and doxycycline , which have led to some improvement in her symptoms.  She experiences a persistent dry cough but no colored phlegm. She has not experienced nausea, vomiting, or diarrhea from Tagrisso  but reports constipation, which she attributes to her medication regimen. Her appetite is poor, and she finds food unpalatable, though she has not lost significant weight, maintaining a weight of 122.4 pounds as of today.  She is currently taking Tagrisso  without any reported issues and also takes Cozaar  100 mg for blood pressure management. Her port has not been functioning properly despite attempts to flush it, and she is on oral medications.        MEDICAL HISTORY: Past Medical History:  Diagnosis Date   Aortic stenosis    Arthritis    Breast cancer (HCC)    Chronic back pain    Colon polyps    GERD (gastroesophageal reflux disease)    Hypertension    Malignant neoplasm of right female breast (HCC)    unspecified site of breast   Osteoarthritis    of the knee left worse than right   Prediabetes     ALLERGIES:  is allergic to aspirin, gabapentin, losartan  potassium-hctz, and penicillins.  MEDICATIONS:  Current Outpatient Medications  Medication Sig Dispense Refill   doxycycline  (VIBRA -TABS) 100 MG tablet Take 1 tablet (100 mg total) by mouth 2 (two) times daily. 20 tablet 0   lidocaine -prilocaine  (EMLA ) cream Apply 1 Application topically as needed. 30 g 2   losartan  (COZAAR ) 100 MG tablet Take 100 mg by mouth daily.  magnesium gluconate (MAGONATE) 500 MG tablet Take 500 mg by mouth 2 (two) times daily.     methylPREDNISolone  (MEDROL  DOSEPAK) 4 MG TBPK tablet Use as instructed 21 tablet 0   Multiple Vitamin (MULTIVITAMIN) capsule Take 1 capsule by mouth daily.     osimertinib  mesylate (TAGRISSO ) 80 MG tablet Take 1 tablet (80 mg total) by mouth daily. 30 tablet 2    No current facility-administered medications for this visit.    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   BREAST LUMPECTOMY WITH NEEDLE LOCALIZATION Right 05/11/2013   Procedure: RIGHT BREAST NEEDLE LOCALIZATION  LUMPECTOMY;  Surgeon: Lockie Rima, MD;  Location: [facility];  Service: General;  Laterality: Right;   BREAST LUMPECTOMY WITH RADIOACTIVE SEED LOCALIZATION Right 08/02/2020   Procedure: RIGHT BREAST LUMPECTOMY WITH RADIOACTIVE SEED LOCALIZATION;  Surgeon: Lockie Rima, MD;  Location:  SURGERY CENTER;  Service: General;  Laterality: Right;  RNFA   BRONCHIAL BIOPSY  10/07/2023   Procedure: BRONCHIAL BIOPSIES;  Surgeon: Denson Flake, MD;  Location: [facility] ENDOSCOPY;  Service: Pulmonary;;   BRONCHIAL BRUSHINGS  10/07/2023   Procedure: BRONCHIAL BRUSHINGS;  Surgeon: Denson Flake, MD;  Location: [facility] ENDOSCOPY;  Service: Pulmonary;;   BRONCHIAL NEEDLE ASPIRATION BIOPSY  10/07/2023   Procedure: BRONCHIAL NEEDLE ASPIRATION BIOPSIES;  Surgeon: Denson Flake, MD;  Location: [facility] ENDOSCOPY;  Service: Pulmonary;;   CHOLECYSTECTOMY     COLONOSCOPY     FOOT OSTEOTOMY     both  feet   IR CV LINE INJECTION  02/20/2024   IR IMAGING GUIDED PORT INSERTION  11/07/2023   TONSILLECTOMY     VIDEO BRONCHOSCOPY WITH ENDOBRONCHIAL ULTRASOUND N/A 10/07/2023   Procedure: VIDEO BRONCHOSCOPY WITH ENDOBRONCHIAL ULTRASOUND;  Surgeon: Denson Flake, MD;  Location: [facility] ENDOSCOPY;  Service: Pulmonary;  Laterality: N/A;    REVIEW OF SYSTEMS:  Constitutional: positive for fatigue and weight loss Eyes: negative Ears, nose, mouth, throat, and face: negative Respiratory: positive for cough Cardiovascular: negative Gastrointestinal: positive for constipation Genitourinary:negative Integument/breast:  negative Hematologic/lymphatic: negative Musculoskeletal:negative Neurological: negative Behavioral/Psych: negative Endocrine: negative Allergic/Immunologic: negative   PHYSICAL EXAMINATION: General appearance: alert, cooperative, fatigued, and no distress Head: Normocephalic, without obvious abnormality, atraumatic Neck: no adenopathy, no JVD, supple, symmetrical, trachea midline, and thyroid not enlarged, symmetric, no tenderness/mass/nodules Lymph nodes: Cervical, supraclavicular, and axillary nodes normal. Resp: clear to auscultation bilaterally Back: symmetric, no curvature. ROM normal. No CVA tenderness. Cardio: regular rate and rhythm, S1, S2 normal, no murmur, click, rub or gallop GI: soft, non-tender; bowel sounds normal; no masses,  no organomegaly Extremities: extremities normal, atraumatic, no cyanosis or edema Neurologic: Alert and oriented X 3, normal strength and tone. Normal symmetric reflexes. Normal coordination and gait  ECOG PERFORMANCE STATUS: 1 - Symptomatic but completely ambulatory  Blood pressure (!) 156/92, temperature 97.8 F (36.6 C), temperature source Temporal, resp. rate 16, height 5' (1.524 m), weight 122 lb 6.4 oz (55.5 kg), SpO2 100%.  LABORATORY DATA: Lab Results  Component Value Date   WBC 8.7 03/18/2024   HGB 11.7 (L) 03/18/2024   HCT 34.8 (L) 03/18/2024   MCV 76.0 (L) 03/18/2024   PLT 181 03/18/2024      Chemistry      Component Value Date/Time   NA 130 (L) 03/18/2024 1040   NA 139 10/17/2015 1322  K 4.3 03/18/2024 1040   K 4.1 10/17/2015 1322   CL 95 (L) 03/18/2024 1040   CO2 29 03/18/2024 1040   CO2 28 10/17/2015 1322   BUN 25 (H) 03/18/2024 1040   BUN 14.5 10/17/2015 1322   CREATININE 0.79 03/18/2024 1040   CREATININE 0.87 04/10/2016 1540   CREATININE 0.9 10/17/2015 1322      Component Value Date/Time   CALCIUM 9.5 03/18/2024 1040   CALCIUM 10.1 10/17/2015 1322   ALKPHOS 55 03/18/2024 1040   ALKPHOS 103 10/17/2015 1322    AST 17 03/18/2024 1040   AST 24 10/17/2015 1322   ALT 16 03/18/2024 1040   ALT 32 10/17/2015 1322   BILITOT 0.5 03/18/2024 1040   BILITOT 0.33 10/17/2015 1322       RADIOGRAPHIC STUDIES: CT Chest W Contrast Result Date: 03/10/2024 CLINICAL DATA:  Non-small cell lung cancer (NSCLC), non-metastatic, assess treatment response. * Tracking Code: BO * EXAM: CT CHEST WITH CONTRAST TECHNIQUE: Multidetector CT imaging of the chest was performed during intravenous contrast administration. RADIATION DOSE REDUCTION: This exam was performed according to the departmental dose-optimization program which includes automated exposure control, adjustment of the mA and/or kV according to patient size and/or use of iterative reconstruction technique. CONTRAST:  75mL OMNIPAQUE  IOHEXOL  300 MG/ML  SOLN COMPARISON:  CT scan chest from 01/07/2024. FINDINGS: Cardiovascular: Normal cardiac size. No pericardial effusion. No aortic aneurysm. 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 Mediastinum/Nodes: There is a stable 5 mm hypoattenuating nodule in the left thyroid lobe, incompletely characterized on the current exam but grossly similar to the prior study. Redemonstration of fat stranding within the mediastinal, essentially similar to the prior study. The esophagus is nondistended precluding optimal assessment. However, note is again made of mild to moderate circumferential thickening of midthoracic esophagus, essentially similar to the prior study and concerning for underlying esophagitis. There are several prominent mediastinal lymph nodes with largest measuring up to 11 x 11 mm, which are essentially similar to the prior study. No axillary or hilar lymphadenopathy by size criteria. Lungs/Pleura: The central tracheo-bronchial tree is patent. Since the prior study, there are new predominantly ground-glass opacities throughout bilateral lungs, predominantly in the posterior segment of right upper lobe and right lower lobe.  There are also patchy opacities in the middle lobe, apicoposterior segment of left upper lobe and left lower lobe. Previously described solid opacity along the right major fissure appears smaller in size measuring approximately 1.4 cm in thickness. No pleural effusion or pneumothorax. No lung collapse. No new or suspicious lung nodule. Upper Abdomen: There is mild diffuse hepatic steatosis. Surgically absent gallbladder. Mild central intrahepatic and extrahepatic bile duct prominence is likely secondary to post cholecystectomy status. Remaining visualized upper abdominal viscera within normal limits. Musculoskeletal: A CT Port-a-Cath is seen in the right upper chest wall with the catheter terminating in the cavo-atrial junction region. Visualized soft tissues of the chest wall are otherwise grossly unremarkable. No suspicious osseous lesions. There are mild multilevel degenerative changes in the visualized spine. IMPRESSION: 1. Since the prior study, there are new opacities throughout bilateral lungs, predominantly in the posterior segment of right upper lobe and right lower lobe. There are also patchy opacities in the middle lobe, apicoposterior segment of left upper lobe and left lower lobe. Findings favor multilobar pneumonia. Follow-up to clearing is recommended. 2. No new or suspicious lung nodule. 3. Persistent mediastinal fat stranding and midthoracic esophagitis. Findings may be secondary to radiation therapy. Correlate clinically. 4. Multiple other nonacute  observations, as described above. Aortic Atherosclerosis (ICD10-I70.0). Electronically Signed   By: Beula Brunswick M.D.   On: 03/10/2024 15:08   IR CV Line Injection Result Date: 02/20/2024 INDICATION: no blood return through port x3 History of history of of NSCLC s/p IR port placement 11/07/2023. Reported difficulty with venous return from port. EXAM: PORT CATHETER INJECTION COMPARISON:  IR fluoroscopy, 11/07/2023.  CT chest, 01/07/2024. MEDICATIONS:  None ANESTHESIA/SEDATION: None CONTRAST:  10 mL Omnipaque  300 FLUOROSCOPY TIME:  Fluoroscopic dose; 2 mGy COMPLICATIONS: None immediate. PROCEDURE: The patient's RIGHT chest port a catheter was accessed then the patient was placed supine on the fluoroscopy table. A preprocedural spot radiographic image was obtained of the existing port a catheter with tip overlying the distal aspect of the SVC. Multiple spot fluoroscopic and angiographic images were obtained in various obliquities following the injection of a small amount of contrast via the Port a catheter. Images were reviewed and the procedure was terminated. The Port a catheter was flushed with a heparin  dwell then de-accessed. A dressing was placed. The patient tolerated the procedure well without immediate postprocedural complication. FINDINGS: *The patient's RIGHT chest port a catheter tip terminates within the distal aspect of this SVC. There is no evidence of catheter kink or fracture. *Port a Catheter with noted difficulty in aspiration, alleviated with positional change. *Contrast injection confirms appropriate functionality of the Port a catheter without evidence of fibrin sheath formation about the catheter tip. There is no evidence of contrast extravasation about the catheter tubing. IMPRESSION: Difficulty in port catheter aspiration, alleviated with positional change. Normal contrast injection without overt fibrin sheath, catheter kink or fracture. Findings less likely to represent fibrin sheath and likely venous wall abutment. Above findings were discussed with the patient and their spouse, and the following options were provided: 1. Maintain the Port a catheter as is, with positional change required for blood draws/aspiration. 2. Port catheter revision, requiring removal and replacement. 3. Port catheter removal should therapy be completed. Art Largo, MD [facility] Electronically Signed    By: Art Largo M.D.   On: 02/20/2024 11:17    ASSESSMENT AND PLAN: This is a very pleasant 73 years old female with stage IIIb (T1c, N3, M0) non-small cell lung cancer, adenocarcinoma presented with right middle lobe lung nodule in addition to bilateral hilar and mediastinal lymphadenopathy diagnosed in December 2024.  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 She is currently on concurrent chemoradiation with weekly carboplatin  for AUC of 2 and paclitaxel  45 Mg/M2.  First dose October 29, 2023.  She is status post 6 cycles.  She has been tolerating the treatment well except for the fatigue as well as radiation-induced odynophagia and dysphagia with poor p.o. intake.  She is recovering well from the previous course of concurrent chemoradiation.   She is currently on treatment with Tagrisso  80 mg p.o. daily started January 18, 2024.  She has been tolerating this treatment fairly well with no concerning adverse effects. Assessment and Plan    Lung adenocarcinoma, stage 3B Stage 3B lung adenocarcinoma with EGFR mutation, currently on Tagrisso  80 mg PO daily since January 18, 2024. Recent CT scan showed inflammation in the chest. Reports no significant side effects from Tagrisso , except for constipation. No weight loss noted. Blood work is stable. - Continue Tagrisso  80 mg PO daily - Plan for CT scan in approximately two months to assess disease status  Pneumonia Recent  pneumonia diagnosed after a CT scan on Mar 06, 2024, with associated cough and breathing difficulties. Treated with Medrol  Dosepak and doxycycline , resulting in some improvement in symptoms. Current symptoms include a dry cough with no colored sputum. - Continue current treatment with Medrol  Dosepak and doxycycline  - Monitor respiratory symptoms  Constipation Constipation likely secondary to multiple medications, including Tagrisso . No significant weight  loss reported. - Encourage dietary modifications to alleviate constipation  Hypertension Hypertension managed with Losartan  100 mg. Blood pressure slightly elevated, possibly due to multiple medications. - Continue Losartan  100 mg - Monitor blood pressure regularly  Goals of Care Discussion regarding the removal of the non-functioning port. She is currently on oral medication and does not require the port for infusions. Informed consent obtained for port removal with understanding that a new port can be placed if future infusion therapy becomes necessary. - Order removal of the non-functioning port - Discuss potential need for future port placement if infusion therapy becomes necessary  Follow-up Routine follow-up planned to monitor disease status and treatment efficacy. - Schedule follow-up appointment in one month with routine blood work   She was advised to call immediately if she has any other concerning symptoms in the interval. The patient voices understanding of current disease status and treatment options and is in agreement with the current care plan.  All questions were answered. The patient knows to call the clinic with any problems, questions or concerns. We can certainly see the patient much sooner if necessary.  The total time spent in the appointment was 30 minutes including review of chart and various tests results, discussions about plan of care and coordination of care plan .   Disclaimer: This note was dictated with voice recognition software. Similar sounding words can inadvertently be transcribed and may not be corrected upon review.

## 2024-03-19 ENCOUNTER — Other Ambulatory Visit: Payer: Self-pay

## 2024-03-20 ENCOUNTER — Telehealth: Payer: Self-pay

## 2024-03-20 ENCOUNTER — Ambulatory Visit
Admission: RE | Admit: 2024-03-20 | Discharge: 2024-03-20 | Disposition: A | Discharge status: Home / Self Care | Source: Ambulatory Visit | Attending: Internal Medicine | Admitting: Internal Medicine

## 2024-03-20 ENCOUNTER — Other Ambulatory Visit: Payer: Self-pay | Admitting: Physician Assistant

## 2024-03-20 ENCOUNTER — Other Ambulatory Visit: Payer: Self-pay

## 2024-03-20 DIAGNOSIS — D0501 Lobular carcinoma in situ of right breast: Secondary | ICD-10-CM

## 2024-03-20 NOTE — Telephone Encounter (Signed)
 Pt's husband called stating the pt went to her appt today to have her Port removed but lft the DRI before having the port removed d/t constant delays.  Pt's husband stated the nurse keep informing them that they were running behind but the pt became restless and wanted to leave.  Pt's husband is calling to make Dr. Bing Buff office aware of situation and would like for pt to be rescheduled for port removal next week.  Explained to pt that next week is a holiday week and that the appt may cannot be rescheduled next week if they have no availability.  Pt's husband then requested to speak with Cassie H, PA.  Stated this nurse will make Cassie aware of their call and their request for a callback.  Pt's husband verbalized understanding and had no further questions at this time.

## 2024-03-27 ENCOUNTER — Telehealth: Payer: Self-pay

## 2024-03-27 NOTE — Telephone Encounter (Signed)
 Received call from patient's husband regarding patient's port removal appointment. He requested that the patient be placed on a cancellation list in case an earlier appointment becomes available. Message sent to Tiffany in IR to relay this request. Patient has been added to the cancellation list.

## 2024-04-06 ENCOUNTER — Other Ambulatory Visit: Payer: Self-pay

## 2024-04-07 ENCOUNTER — Ambulatory Visit (HOSPITAL_COMMUNITY)
Admission: RE | Admit: 2024-04-07 | Discharge: 2024-04-07 | Disposition: A | Discharge status: Home / Self Care | Source: Ambulatory Visit | Attending: Internal Medicine | Admitting: Internal Medicine

## 2024-04-07 DIAGNOSIS — Z452 Encounter for adjustment and management of vascular access device: Secondary | ICD-10-CM | POA: Diagnosis not present

## 2024-04-07 DIAGNOSIS — D0501 Lobular carcinoma in situ of right breast: Secondary | ICD-10-CM | POA: Insufficient documentation

## 2024-04-07 HISTORY — PX: IR REMOVAL TUN ACCESS W/ PORT W/O FL MOD SED: IMG2290

## 2024-04-07 MED ORDER — LIDOCAINE-EPINEPHRINE 1 %-1:100000 IJ SOLN
20.0000 mL | Freq: Once | INTRAMUSCULAR | Status: AC
  Administered 2024-04-07: 7 mL via INTRADERMAL

## 2024-04-07 MED ORDER — LIDOCAINE-EPINEPHRINE 1 %-1:100000 IJ SOLN
INTRAMUSCULAR | Status: AC
  Filled 2024-04-07: qty 1

## 2024-04-07 NOTE — Procedures (Signed)
 Interventional Radiology Procedure Note  Procedure: Port removal  Complications: None  Estimated Blood Loss: < 10 mL  Findings: RIJ SL chest port removed.  Rosetta Cons, MD

## 2024-04-09 ENCOUNTER — Other Ambulatory Visit: Payer: Self-pay | Admitting: Internal Medicine

## 2024-04-09 ENCOUNTER — Other Ambulatory Visit: Payer: Self-pay

## 2024-04-09 DIAGNOSIS — C342 Malignant neoplasm of middle lobe, bronchus or lung: Secondary | ICD-10-CM

## 2024-04-10 ENCOUNTER — Other Ambulatory Visit (HOSPITAL_COMMUNITY): Payer: Self-pay

## 2024-04-10 ENCOUNTER — Other Ambulatory Visit: Payer: Self-pay

## 2024-04-10 MED ORDER — OSIMERTINIB MESYLATE 80 MG PO TABS
80.0000 mg | ORAL_TABLET | Freq: Every day | ORAL | 2 refills | Status: AC
Start: 2024-04-10 — End: ?
  Filled 2024-04-10 – 2024-04-13 (×2): qty 30, 30d supply, fill #0
  Filled 2024-05-21: qty 30, 30d supply, fill #1

## 2024-04-13 ENCOUNTER — Other Ambulatory Visit: Payer: Self-pay | Admitting: Pharmacy Technician

## 2024-04-13 ENCOUNTER — Other Ambulatory Visit: Payer: Self-pay

## 2024-04-13 NOTE — Progress Notes (Signed)
 Specialty Pharmacy Refill Coordination Note  Ashley Pratt is a 73 y.o. female contacted today regarding refills of specialty medication(s) Osimertinib  Mesylate (TAGRISSO )   Patient requested [facility] at [facility] [place] date: 04/15/24   Medication will be filled on 04/13/24.  Spoke to patient's husband.

## 2024-04-14 ENCOUNTER — Other Ambulatory Visit (HOSPITAL_COMMUNITY): Payer: Self-pay

## 2024-04-15 ENCOUNTER — Inpatient Hospital Stay: Attending: Hematology | Admitting: Internal Medicine

## 2024-04-15 ENCOUNTER — Other Ambulatory Visit (HOSPITAL_COMMUNITY): Payer: Self-pay

## 2024-04-15 ENCOUNTER — Inpatient Hospital Stay

## 2024-04-15 VITALS — BP 161/84 | HR 99 | Temp 97.4°F | Resp 18 | Ht 60.0 in | Wt 124.1 lb

## 2024-04-15 DIAGNOSIS — Z923 Personal history of irradiation: Secondary | ICD-10-CM | POA: Diagnosis not present

## 2024-04-15 DIAGNOSIS — Z9221 Personal history of antineoplastic chemotherapy: Secondary | ICD-10-CM | POA: Insufficient documentation

## 2024-04-15 DIAGNOSIS — C342 Malignant neoplasm of middle lobe, bronchus or lung: Secondary | ICD-10-CM | POA: Insufficient documentation

## 2024-04-15 DIAGNOSIS — D0501 Lobular carcinoma in situ of right breast: Secondary | ICD-10-CM

## 2024-04-15 DIAGNOSIS — Z79899 Other long term (current) drug therapy: Secondary | ICD-10-CM | POA: Diagnosis not present

## 2024-04-15 DIAGNOSIS — C3490 Malignant neoplasm of unspecified part of unspecified bronchus or lung: Secondary | ICD-10-CM

## 2024-04-15 LAB — CBC WITH DIFFERENTIAL (CANCER CENTER ONLY)
Abs Immature Granulocytes: 0.01 10*3/uL (ref 0.00–0.07)
Basophils Absolute: 0 10*3/uL (ref 0.0–0.1)
Basophils Relative: 0 %
Eosinophils Absolute: 0.1 10*3/uL (ref 0.0–0.5)
Eosinophils Relative: 1 %
HCT: 32.4 % — ABNORMAL LOW (ref 36.0–46.0)
Hemoglobin: 10.9 g/dL — ABNORMAL LOW (ref 12.0–15.0)
Immature Granulocytes: 0 %
Lymphocytes Relative: 15 %
Lymphs Abs: 1 10*3/uL (ref 0.7–4.0)
MCH: 24.9 pg — ABNORMAL LOW (ref 26.0–34.0)
MCHC: 33.6 g/dL (ref 30.0–36.0)
MCV: 74 fL — ABNORMAL LOW (ref 80.0–100.0)
Monocytes Absolute: 0.6 10*3/uL (ref 0.1–1.0)
Monocytes Relative: 10 %
Neutro Abs: 4.7 10*3/uL (ref 1.7–7.7)
Neutrophils Relative %: 74 %
Platelet Count: 192 10*3/uL (ref 150–400)
RBC: 4.38 MIL/uL (ref 3.87–5.11)
RDW: 15.5 % (ref 11.5–15.5)
WBC Count: 6.3 10*3/uL (ref 4.0–10.5)
nRBC: 0 % (ref 0.0–0.2)

## 2024-04-15 LAB — CMP (CANCER CENTER ONLY)
ALT: 15 U/L (ref 0–44)
AST: 21 U/L (ref 15–41)
Albumin: 3.9 g/dL (ref 3.5–5.0)
Alkaline Phosphatase: 61 U/L (ref 38–126)
Anion gap: 6 (ref 5–15)
BUN: 18 mg/dL (ref 8–23)
CO2: 29 mmol/L (ref 22–32)
Calcium: 9.1 mg/dL (ref 8.9–10.3)
Chloride: 96 mmol/L — ABNORMAL LOW (ref 98–111)
Creatinine: 0.76 mg/dL (ref 0.44–1.00)
GFR, Estimated: >60 mL/min (ref >60)
Glucose, Bld: 121 mg/dL — ABNORMAL HIGH (ref 70–99)
Potassium: 3.9 mmol/L (ref 3.5–5.1)
Sodium: 131 mmol/L — ABNORMAL LOW (ref 135–145)
Total Bilirubin: 0.4 mg/dL (ref 0.0–1.2)
Total Protein: 6.9 g/dL (ref 6.5–8.1)

## 2024-04-15 NOTE — Progress Notes (Signed)
 [facility] [phone]   [phone]  OFFICE PROGRESS NOTE  Marlene Simas, MD [address] [place] [address]  DIAGNOSIS:  stage IIIb (T1c, N3, M0) non-small cell lung cancer, adenocarcinoma presented with right middle lobe lung nodule in addition to bilateral hilar and mediastinal lymphadenopathy diagnosed in December 2024.   Biomarker Findings HRD signature - HRDsig Negative Microsatellite status - MS-Stable Tumor Mutational Burden - 2 Muts/Mb Genomic Findings For a complete list of the genes assayed, please refer to the Appendix. EGFR G719C, E709V, amplification CDKN2A loss - equivocal? MTAP loss - equivocal? CDKN2B loss - equivocal? FGF23 R187W - subclonal? TP53 R248L 7 Disease relevant genes with no reportable alterations: ALK, BRAF, ERBB2, KRAS, MET, RET, ROS1  PDL1 Expression 30%   PRIOR THERAPY: Concurrent chemoradiation with weekly carboplatin  for AUC of 2 and paclitaxel  45 Mg/M2.  First dose October 29, 2023.  Status post 6 cycles.  CURRENT THERAPY: Consolidation treatment with Tagrisso  80 mg p.o. daily.  First dose started January 18, 2024. INTERVAL HISTORY: Ashley Pratt 73 y.o. female returns to the clinic today for follow-up visit accompanied by her husband.  Discussed the use of AI scribe software for clinical note transcription with the patient, who gave verbal consent to proceed.  History of Present Illness   Ashley Pratt is a 73 year old female with stage III B non-small cell lung cancer who presents for evaluation and repeat blood work.  She was diagnosed with stage III B non-small cell lung cancer, adenocarcinoma, in December 2024, with a positive EGFR mutation (G719C and E709V) and a PD-L1 expression of 30%. She underwent concurrent chemoradiation with weekly carboplatin  and paclitaxel  and is currently on consolidation treatment with Tagrisso  80 mg daily, started on January 18, 2024.  She experiences swelling and significant pain under her right mandible, which she describes as a lymph node, present for about one month. The swelling is severe enough to cause difficulty swallowing, leading her to consume mostly liquids. Her swallowing had improved after the initial course of chemoradiation but has worsened again due to the swelling.  She is unable to take her blood pressure medication in its original form due to the size of the tablets, and her dosage has been adjusted to 50 mg. She is also unable to take magnesium, multivitamins, or iron supplements due to the size of the tablets. She has persistent coughing, particularly during the daytime, and uses Ricola for relief. She does not take cough medicine due to constipation issues.  No symptoms of pneumonia currently. No fever or chills. She experiences difficulty swallowing due to swelling under her right mandible and persistent coughing during the daytime.        MEDICAL HISTORY: Past Medical History:  Diagnosis Date   Aortic stenosis    Arthritis    Breast cancer (HCC)    Chronic back pain    Colon polyps    GERD (gastroesophageal reflux disease)    Hypertension    Malignant neoplasm of right female breast (HCC)    unspecified site of breast   Osteoarthritis    of the knee left worse than right   Prediabetes     ALLERGIES:  is allergic to aspirin, gabapentin, losartan  potassium-hctz, and penicillins.  MEDICATIONS:  Current Outpatient Medications  Medication Sig Dispense Refill   doxycycline  (VIBRA -TABS) 100 MG tablet Take 1 tablet (100 mg total) by mouth 2 (two) times daily. 20 tablet 0   lidocaine -prilocaine  (  EMLA ) cream Apply 1 Application topically as needed. 30 g 2   losartan  (COZAAR ) 100 MG tablet Take 100 mg by mouth daily.     magnesium gluconate (MAGONATE) 500 MG tablet Take 500 mg by mouth 2 (two) times daily.     methylPREDNISolone  (MEDROL  DOSEPAK) 4 MG TBPK tablet Use as instructed 21 tablet  0   Multiple Vitamin (MULTIVITAMIN) capsule Take 1 capsule by mouth daily.     osimertinib  mesylate (TAGRISSO ) 80 MG tablet Take 1 tablet (80 mg total) by mouth daily. 30 tablet 2   No current facility-administered medications for this visit.    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   BREAST LUMPECTOMY WITH NEEDLE LOCALIZATION Right 05/11/2013   Procedure: RIGHT BREAST NEEDLE LOCALIZATION  LUMPECTOMY;  Surgeon: Lockie Rima, MD;  Location: [facility];  Service: General;  Laterality: Right;   BREAST LUMPECTOMY WITH RADIOACTIVE SEED LOCALIZATION Right 08/02/2020   Procedure: RIGHT BREAST LUMPECTOMY WITH RADIOACTIVE SEED LOCALIZATION;  Surgeon: Lockie Rima, MD;  Location: [facility];  Service: General;  Laterality: Right;  RNFA   BRONCHIAL BIOPSY  10/07/2023   Procedure: BRONCHIAL BIOPSIES;  Surgeon: Denson Flake, MD;  Location: [facility] ENDOSCOPY;  Service: Pulmonary;;   BRONCHIAL BRUSHINGS  10/07/2023   Procedure: BRONCHIAL BRUSHINGS;  Surgeon: Denson Flake, MD;  Location: [facility] ENDOSCOPY;  Service: Pulmonary;;   BRONCHIAL NEEDLE ASPIRATION BIOPSY  10/07/2023   Procedure: BRONCHIAL NEEDLE ASPIRATION BIOPSIES;  Surgeon: Denson Flake, MD;  Location: [facility] ENDOSCOPY;  Service: Pulmonary;;   CHOLECYSTECTOMY     COLONOSCOPY     FOOT OSTEOTOMY     both  feet   IR CV LINE INJECTION  02/20/2024   IR IMAGING GUIDED PORT INSERTION  11/07/2023   IR REMOVAL TUN ACCESS W/ PORT W/O FL MOD SED  04/07/2024   TONSILLECTOMY     VIDEO BRONCHOSCOPY WITH ENDOBRONCHIAL ULTRASOUND N/A 10/07/2023   Procedure: VIDEO BRONCHOSCOPY WITH ENDOBRONCHIAL ULTRASOUND;  Surgeon: Denson Flake, MD;  Location: [facility] ENDOSCOPY;  Service: Pulmonary;  Laterality: N/A;    REVIEW OF SYSTEMS:  Constitutional: positive for fatigue and  weight loss Eyes: negative Ears, nose, mouth, throat, and face: negative Respiratory: positive for cough Cardiovascular: negative Gastrointestinal: positive for dysphagia Genitourinary:negative Integument/breast: negative Hematologic/lymphatic: negative Musculoskeletal:positive for neck pain Neurological: negative Behavioral/Psych: negative Endocrine: negative Allergic/Immunologic: negative   PHYSICAL EXAMINATION: General appearance: alert, cooperative, fatigued, and no distress Head: Normocephalic, without obvious abnormality, atraumatic Neck: no adenopathy, no JVD, supple, symmetrical, trachea midline, and thyroid not enlarged, symmetric, no tenderness/mass/nodules Lymph nodes: Cervical, supraclavicular, and axillary nodes normal. Resp: clear to auscultation bilaterally Back: symmetric, no curvature. ROM normal. No CVA tenderness. Cardio: regular rate and rhythm, S1, S2 normal, no murmur, click, rub or gallop GI: soft, non-tender; bowel sounds normal; no masses,  no organomegaly Extremities: extremities normal, atraumatic, no cyanosis or edema Neurologic: Alert and oriented X 3, normal strength and tone. Normal symmetric reflexes. Normal coordination and gait  ECOG PERFORMANCE STATUS: 1 - Symptomatic but completely ambulatory  Blood pressure (!) 161/84, pulse 99, temperature (!) 97.4 F (36.3 C), temperature source Tympanic, resp. rate 18, height 5' (1.524 m), weight 124 lb 1.6 oz (56.3 kg), SpO2 98%.  LABORATORY DATA: Lab Results  Component Value Date   WBC 6.3 04/15/2024   HGB  10.9 (L) 04/15/2024   HCT 32.4 (L) 04/15/2024   MCV 74.0 (L) 04/15/2024   PLT 192 04/15/2024      Chemistry      Component Value Date/Time   NA 131 (L) 04/15/2024 1025   NA 139 10/17/2015 1322   K 3.9 04/15/2024 1025   K 4.1 10/17/2015 1322   CL 96 (L) 04/15/2024 1025   CO2 29 04/15/2024 1025   CO2 28 10/17/2015 1322   BUN 18 04/15/2024 1025   BUN 14.5 10/17/2015 1322   CREATININE 0.76  04/15/2024 1025   CREATININE 0.87 04/10/2016 1540   CREATININE 0.9 10/17/2015 1322      Component Value Date/Time   CALCIUM 9.1 04/15/2024 1025   CALCIUM 10.1 10/17/2015 1322   ALKPHOS 61 04/15/2024 1025   ALKPHOS 103 10/17/2015 1322   AST 21 04/15/2024 1025   AST 24 10/17/2015 1322   ALT 15 04/15/2024 1025   ALT 32 10/17/2015 1322   BILITOT 0.4 04/15/2024 1025   BILITOT 0.33 10/17/2015 1322       RADIOGRAPHIC STUDIES: IR REMOVAL TUN ACCESS W/ PORT W/O FL MOD SED Result Date: 04/07/2024 INDICATION: Therapy complete. Planned removal of durable venous access (chest port) EXAM: REMOVAL RIGHT IJ VEIN PORT-A-CATH MEDICATIONS: None; The antibiotic was administered within an appropriate time interval prior to skin puncture. ANESTHESIA/SEDATION: No sedation FLUOROSCOPY: Radiation Exposure Index 0 COMPLICATIONS: None immediate. PROCEDURE: Informed written consent was obtained from the patient after a thorough discussion of the procedural risks, benefits and alternatives. All questions were addressed. Maximal Sterile Barrier Technique was utilized including caps, mask, sterile gowns, sterile gloves, sterile drape, hand hygiene and skin antiseptic. A timeout was performed prior to the initiation of the procedure. The right chest was prepped and draped in a sterile fashion. Lidocaine  was utilized for local anesthesia. An incision was made over the previously healed surgical incision. Utilizing blunt dissection, the port catheter and reservoir were removed from the underlying subcutaneous tissue in their entirety. Securing sutures were also removed. The pocket was irrigated with a copious amount of sterile normal saline. The pocket was closed with interrupted 3-0 Vicryl stitches. The subcutaneous tissue was closed with 3-0 Vicryl interrupted subcutaneous stitches. A 4-0 Vicryl running subcuticular stitch was utilized to approximate the skin. Dermabond was applied. IMPRESSION: Successful right IJ vein  Port-A-Cath explant. Electronically Signed   By: Susan Ensign   On: 04/07/2024 10:04    ASSESSMENT AND PLAN: This is a very pleasant 73 years old female with stage IIIb (T1c, N3, M0) non-small cell lung cancer, adenocarcinoma presented with right middle lobe lung nodule in addition to bilateral hilar and mediastinal lymphadenopathy diagnosed in December 2024.  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 She is currently on concurrent chemoradiation with weekly carboplatin  for AUC of 2 and paclitaxel  45 Mg/M2.  First dose October 29, 2023.  She is status post 6 cycles.  She has been tolerating the treatment well except for the fatigue as well as radiation-induced odynophagia and dysphagia with poor p.o. intake.  She is recovering well from the previous course of concurrent chemoradiation.   She is currently on treatment with Tagrisso  80 mg p.o. daily started January 18, 2024.  She has been tolerating this treatment fairly well with no concerning adverse effects.    Stage 3B non-small cell lung cancer, adenocarcinoma Stage 3B non-small cell lung cancer, adenocarcinoma with positive EGFR mutation (G719C and E709V) and PD-L1 expression  of 30%. Currently on consolidation treatment with Tagrisso  80 mg PO daily since January 18, 2024. Swelling and pain under right mandible, dysphagia, and persistent cough present.  - Continue Tagrisso  80 mg PO daily - Order neck and chest scan next week to evaluate for any changes or progression  Swelling and pain under right mandible Swelling and pain under the right mandible for approximately one month, causing dysphagia. Possible lymphadenopathy or other glandular swelling. No palpable lymph nodes on examination, but further imaging is warranted. - Order neck scan next week to evaluate swelling and pain under right mandible  Dysphagia Dysphagia likely secondary to swelling under the right  mandible. Difficulty swallowing solid medications and food, currently managing with liquid diet.  Cough Persistent cough, worse during the daytime. No current symptoms of pneumonia. Ricola lozenges are being used for symptomatic relief. - Order chest scan next week to evaluate persistent cough - Continue using Ricola lozenges for cough relief  Anemia Anemia present, but blood counts are well-managed compared to previous levels. No current iron supplementation due to dysphagia.   The patient was advised to call immediately if she has any concerning symptoms in the interval. The patient voices understanding of current disease status and treatment options and is in agreement with the current care plan.  All questions were answered. The patient knows to call the clinic with any problems, questions or concerns. We can certainly see the patient much sooner if necessary.  The total time spent in the appointment was 30 minutes including review of chart and various tests results, discussions about plan of care and coordination of care plan .   Disclaimer: This note was dictated with voice recognition software. Similar sounding words can inadvertently be transcribed and may not be corrected upon review.

## 2024-04-16 ENCOUNTER — Other Ambulatory Visit: Payer: Self-pay

## 2024-04-21 ENCOUNTER — Ambulatory Visit (HOSPITAL_COMMUNITY)
Admission: RE | Admit: 2024-04-21 | Discharge: 2024-04-21 | Disposition: A | Discharge status: Home / Self Care | Source: Ambulatory Visit | Attending: Internal Medicine | Admitting: Internal Medicine

## 2024-04-21 ENCOUNTER — Other Ambulatory Visit (HOSPITAL_COMMUNITY): Payer: Self-pay

## 2024-04-21 DIAGNOSIS — C3490 Malignant neoplasm of unspecified part of unspecified bronchus or lung: Secondary | ICD-10-CM | POA: Insufficient documentation

## 2024-04-21 DIAGNOSIS — R221 Localized swelling, mass and lump, neck: Secondary | ICD-10-CM | POA: Diagnosis not present

## 2024-04-21 DIAGNOSIS — I700 Atherosclerosis of aorta: Secondary | ICD-10-CM | POA: Diagnosis not present

## 2024-04-21 MED ORDER — SODIUM CHLORIDE (PF) 0.9 % IJ SOLN
INTRAMUSCULAR | Status: AC
  Filled 2024-04-21: qty 50

## 2024-04-21 MED ORDER — IOHEXOL 300 MG/ML  SOLN
75.0000 mL | Freq: Once | INTRAMUSCULAR | Status: AC | PRN
  Administered 2024-04-21: 75 mL via INTRAVENOUS

## 2024-04-27 ENCOUNTER — Other Ambulatory Visit (HOSPITAL_COMMUNITY): Payer: Self-pay

## 2024-04-27 ENCOUNTER — Other Ambulatory Visit: Payer: Self-pay

## 2024-04-28 ENCOUNTER — Other Ambulatory Visit: Payer: Self-pay | Admitting: Internal Medicine

## 2024-04-28 ENCOUNTER — Telehealth: Payer: Self-pay

## 2024-04-28 DIAGNOSIS — C342 Malignant neoplasm of middle lobe, bronchus or lung: Secondary | ICD-10-CM

## 2024-04-28 NOTE — Telephone Encounter (Signed)
 Patient's husband called and left a voicemail stating that the CT scan has not been resulted and the patient has an appointment scheduled for tomorrow.  Returned the call and spoke with the patient's husband. Informed him that I contacted [facility] to request that the scan be read prior to the patient's appointment tomorrow at 10:45 AM. Advised that if the scan is not read in time, the appointment can be rescheduled per the patient's request. He voiced understanding.

## 2024-04-29 ENCOUNTER — Inpatient Hospital Stay: Attending: Hematology

## 2024-04-29 ENCOUNTER — Inpatient Hospital Stay: Attending: Hematology | Admitting: Internal Medicine

## 2024-04-29 VITALS — BP 151/83 | HR 93 | Temp 97.6°F | Resp 15 | Ht 60.0 in | Wt 123.2 lb

## 2024-04-29 DIAGNOSIS — Z79899 Other long term (current) drug therapy: Secondary | ICD-10-CM | POA: Insufficient documentation

## 2024-04-29 DIAGNOSIS — Z9221 Personal history of antineoplastic chemotherapy: Secondary | ICD-10-CM | POA: Diagnosis not present

## 2024-04-29 DIAGNOSIS — Z923 Personal history of irradiation: Secondary | ICD-10-CM | POA: Diagnosis not present

## 2024-04-29 DIAGNOSIS — C342 Malignant neoplasm of middle lobe, bronchus or lung: Secondary | ICD-10-CM | POA: Diagnosis not present

## 2024-04-29 DIAGNOSIS — J700 Acute pulmonary manifestations due to radiation: Secondary | ICD-10-CM | POA: Diagnosis not present

## 2024-04-29 LAB — CBC WITH DIFFERENTIAL (CANCER CENTER ONLY)
Abs Immature Granulocytes: 0.02 10*3/uL (ref 0.00–0.07)
Basophils Absolute: 0 10*3/uL (ref 0.0–0.1)
Basophils Relative: 0 %
Eosinophils Absolute: 0.2 10*3/uL (ref 0.0–0.5)
Eosinophils Relative: 3 %
HCT: 32.4 % — ABNORMAL LOW (ref 36.0–46.0)
Hemoglobin: 10.8 g/dL — ABNORMAL LOW (ref 12.0–15.0)
Immature Granulocytes: 0 %
Lymphocytes Relative: 17 %
Lymphs Abs: 1 10*3/uL (ref 0.7–4.0)
MCH: 24.4 pg — ABNORMAL LOW (ref 26.0–34.0)
MCHC: 33.3 g/dL (ref 30.0–36.0)
MCV: 73.1 fL — ABNORMAL LOW (ref 80.0–100.0)
Monocytes Absolute: 0.5 10*3/uL (ref 0.1–1.0)
Monocytes Relative: 9 %
Neutro Abs: 4.4 10*3/uL (ref 1.7–7.7)
Neutrophils Relative %: 71 %
Platelet Count: 210 10*3/uL (ref 150–400)
RBC: 4.43 MIL/uL (ref 3.87–5.11)
RDW: 15.8 % — ABNORMAL HIGH (ref 11.5–15.5)
WBC Count: 6.2 10*3/uL (ref 4.0–10.5)
nRBC: 0 % (ref 0.0–0.2)

## 2024-04-29 LAB — CMP (CANCER CENTER ONLY)
ALT: 15 U/L (ref 0–44)
AST: 20 U/L (ref 15–41)
Albumin: 3.7 g/dL (ref 3.5–5.0)
Alkaline Phosphatase: 57 U/L (ref 38–126)
Anion gap: 5 (ref 5–15)
BUN: 18 mg/dL (ref 8–23)
CO2: 28 mmol/L (ref 22–32)
Calcium: 9.1 mg/dL (ref 8.9–10.3)
Chloride: 96 mmol/L — ABNORMAL LOW (ref 98–111)
Creatinine: 0.78 mg/dL (ref 0.44–1.00)
GFR, Estimated: >60 mL/min (ref >60)
Glucose, Bld: 140 mg/dL — ABNORMAL HIGH (ref 70–99)
Potassium: 4.4 mmol/L (ref 3.5–5.1)
Sodium: 129 mmol/L — ABNORMAL LOW (ref 135–145)
Total Bilirubin: 0.4 mg/dL (ref 0.0–1.2)
Total Protein: 6.8 g/dL (ref 6.5–8.1)

## 2024-04-29 MED ORDER — PREDNISONE 10 MG PO TABS: ORAL_TABLET | ORAL | 0 refills | Status: AC

## 2024-04-29 MED ORDER — HYDROCODONE BIT-HOMATROP MBR 5-1.5 MG/5ML PO SOLN: 5.0000 mL | Freq: Four times a day (QID) | ORAL | 0 refills | Status: AC | PRN

## 2024-04-29 NOTE — Progress Notes (Signed)
 [facility] Telephone:([phone]   [phone]) [phone]  OFFICE PROGRESS NOTE  Ransom Other, MD [address] [place] [address]  DIAGNOSIS:  stage IIIb (T1c, N3, M0) non-small cell lung cancer, adenocarcinoma presented with right middle lobe lung nodule in addition to bilateral hilar and mediastinal lymphadenopathy diagnosed in December 2024.   Biomarker Findings HRD signature - HRDsig Negative Microsatellite status - MS-Stable Tumor Mutational Burden - 2 Muts/Mb Genomic Findings For a complete list of the genes assayed, please refer to the Appendix. EGFR G719C, E709V, amplification CDKN2A loss - equivocal? MTAP loss - equivocal? CDKN2B loss - equivocal? FGF23 R187W - subclonal? TP53 R248L 7 Disease relevant genes with no reportable alterations: ALK, BRAF, ERBB2, KRAS, MET, RET, ROS1  PDL1 Expression 30%   PRIOR THERAPY: Concurrent chemoradiation with weekly carboplatin  for AUC of 2 and paclitaxel  45 Mg/M2.  First dose October 29, 2023.  Status post 6 cycles.  CURRENT THERAPY: Consolidation treatment with Tagrisso  80 mg p.o. daily.  First dose started January 18, 2024. INTERVAL HISTORY: Ashley Pratt 73 y.o. female returns to the clinic today for follow-up visit accompanied by her husband.   Discussed the use of AI scribe software for clinical note transcription with the patient, who gave verbal consent to proceed.  History of Present Illness Ashley Pratt is a 73 year old female with stage 3B non-small cell lung cancer who presents for evaluation after a repeat CT scan of the chest and neck.  Diagnosed with stage 3B non-small cell lung cancer, adenocarcinoma, in December 2024, initially presenting with a right middle lobe lung nodule and bilateral hilar and mediastinal lymphadenopathy. Tumor tested positive for EGFR G719C mutation and E709V amplification, with a PD-L1 expression of 30%.  Underwent concurrent  chemoradiation with weekly carboplatin  and paclitaxel , resulting in a partial response. Since March 2025, on consolidation treatment with Tagrisso  80 mg PO daily. Feels 'a little bit better' but continues to experience a dry cough, which sometimes bothers her. No shortness of breath, difficulty breathing, or chest pain. Notes a decreased appetite but weight has remained stable since the last visit.  Mentions persistent swelling in the neck area, but recent imaging showed no evidence of nodal metastasis in the neck. Recent CT scan showed increased inflammation in both lungs. She had repeat CT scan of the neck and chest performed recently and she is here for evaluation and discussion of her scan results.     MEDICAL HISTORY: Past Medical History:  Diagnosis Date   Aortic stenosis    Arthritis    Breast cancer (HCC)    Chronic back pain    Colon polyps    GERD (gastroesophageal reflux disease)    Hypertension    Malignant neoplasm of right female breast (HCC)    unspecified site of breast   Osteoarthritis    of the knee left worse than right   Prediabetes     ALLERGIES:  is allergic to aspirin, gabapentin, losartan  potassium-hctz, and penicillins.  MEDICATIONS:  Current Outpatient Medications  Medication Sig Dispense Refill   doxycycline  (VIBRA -TABS) 100 MG tablet Take 1 tablet (100 mg total) by mouth 2 (two) times daily. 20 tablet 0   lidocaine -prilocaine  (EMLA ) cream Apply 1 Application topically as needed. 30 g 2   losartan  (COZAAR ) 100 MG tablet Take 100 mg by mouth daily.     magnesium gluconate (MAGONATE) 500 MG tablet Take 500 mg by mouth 2 (two) times daily.  methylPREDNISolone  (MEDROL  DOSEPAK) 4 MG TBPK tablet Use as instructed 21 tablet 0   Multiple Vitamin (MULTIVITAMIN) capsule Take 1 capsule by mouth daily.     osimertinib  mesylate (TAGRISSO ) 80 MG tablet Take 1 tablet (80 mg total) by mouth daily. 30 tablet 2   No current facility-administered medications for this  visit.    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   BREAST LUMPECTOMY WITH NEEDLE LOCALIZATION Right 05/11/2013   Procedure: RIGHT BREAST NEEDLE LOCALIZATION  LUMPECTOMY;  Surgeon: Jina Nephew, MD;  Location: [facility];  Service: General;  Laterality: Right;   BREAST LUMPECTOMY WITH RADIOACTIVE SEED LOCALIZATION Right 08/02/2020   Procedure: RIGHT BREAST LUMPECTOMY WITH RADIOACTIVE SEED LOCALIZATION;  Surgeon: Nephew Jina, MD;  Location: [facility];  Service: General;  Laterality: Right;  RNFA   BRONCHIAL BIOPSY  10/07/2023   Procedure: BRONCHIAL BIOPSIES;  Surgeon: Shelah Lamar RAMAN, MD;  Location: [facility] ENDOSCOPY;  Service: Pulmonary;;   BRONCHIAL BRUSHINGS  10/07/2023   Procedure: BRONCHIAL BRUSHINGS;  Surgeon: Shelah Lamar RAMAN, MD;  Location: [facility] ENDOSCOPY;  Service: Pulmonary;;   BRONCHIAL NEEDLE ASPIRATION BIOPSY  10/07/2023   Procedure: BRONCHIAL NEEDLE ASPIRATION BIOPSIES;  Surgeon: Shelah Lamar RAMAN, MD;  Location: [facility] ENDOSCOPY;  Service: Pulmonary;;   CHOLECYSTECTOMY     COLONOSCOPY     FOOT OSTEOTOMY     both  feet   IR CV LINE INJECTION  02/20/2024   IR IMAGING GUIDED PORT INSERTION  11/07/2023   IR REMOVAL TUN ACCESS W/ PORT W/O FL MOD SED  04/07/2024   TONSILLECTOMY     VIDEO BRONCHOSCOPY WITH ENDOBRONCHIAL ULTRASOUND N/A 10/07/2023   Procedure: VIDEO BRONCHOSCOPY WITH ENDOBRONCHIAL ULTRASOUND;  Surgeon: Shelah Lamar RAMAN, MD;  Location: [facility] ENDOSCOPY;  Service: Pulmonary;  Laterality: N/A;    REVIEW OF SYSTEMS:  Constitutional: positive for anorexia, fatigue, and weight loss Eyes: negative Ears, nose, mouth, throat, and face: negative Respiratory: positive for cough and dyspnea on exertion Cardiovascular: negative Gastrointestinal: negative Genitourinary:negative Integument/breast:  negative Hematologic/lymphatic: negative Musculoskeletal:negative Neurological: negative Behavioral/Psych: negative Endocrine: negative Allergic/Immunologic: negative   PHYSICAL EXAMINATION: General appearance: alert, cooperative, fatigued, and no distress Head: Normocephalic, without obvious abnormality, atraumatic Neck: no adenopathy, no JVD, supple, symmetrical, trachea midline, and thyroid not enlarged, symmetric, no tenderness/mass/nodules Lymph nodes: Cervical, supraclavicular, and axillary nodes normal. Resp: clear to auscultation bilaterally Back: symmetric, no curvature. ROM normal. No CVA tenderness. Cardio: regular rate and rhythm, S1, S2 normal, no murmur, click, rub or gallop GI: soft, non-tender; bowel sounds normal; no masses,  no organomegaly Extremities: extremities normal, atraumatic, no cyanosis or edema Neurologic: Alert and oriented X 3, normal strength and tone. Normal symmetric reflexes. Normal coordination and gait  ECOG PERFORMANCE STATUS: 1 - Symptomatic but completely ambulatory  Blood pressure (!) 151/83, pulse 93, temperature 97.6 F (36.4 C), temperature source Temporal, resp. rate 15, height 5' (1.524 m), weight 123 lb 3.2 oz (55.9 kg), SpO2 99%.  LABORATORY DATA: Lab Results  Component Value Date   WBC 6.2 04/29/2024   HGB 10.8 (L) 04/29/2024   HCT 32.4 (L) 04/29/2024   MCV 73.1 (L) 04/29/2024   PLT 210 04/29/2024      Chemistry      Component Value Date/Time   NA 129 (L) 04/29/2024 1018   NA 139 10/17/2015 1322  K 4.4 04/29/2024 1018   K 4.1 10/17/2015 1322   CL 96 (L) 04/29/2024 1018   CO2 28 04/29/2024 1018   CO2 28 10/17/2015 1322   BUN 18 04/29/2024 1018   BUN 14.5 10/17/2015 1322   CREATININE 0.78 04/29/2024 1018   CREATININE 0.87 04/10/2016 1540   CREATININE 0.9 10/17/2015 1322      Component Value Date/Time   CALCIUM 9.1 04/29/2024 1018   CALCIUM 10.1 10/17/2015 1322   ALKPHOS 57 04/29/2024 1018   ALKPHOS 103 10/17/2015  1322   AST 20 04/29/2024 1018   AST 24 10/17/2015 1322   ALT 15 04/29/2024 1018   ALT 32 10/17/2015 1322   BILITOT 0.4 04/29/2024 1018   BILITOT 0.33 10/17/2015 1322       RADIOGRAPHIC STUDIES: CT Soft Tissue Neck W Contrast Result Date: 04/28/2024 CLINICAL DATA:  Provided history: Malignant neoplasm of unspecified part of unspecified bronchus or lung. Neck mass, non-pulsatile EXAM: CT NECK WITH CONTRAST TECHNIQUE: Multidetector CT imaging of the neck was performed using the standard protocol following the bolus administration of intravenous contrast. RADIATION DOSE REDUCTION: This exam was performed according to the departmental dose-optimization program which includes automated exposure control, adjustment of the mA and/or kV according to patient size and/or use of iterative reconstruction technique. CONTRAST:  75mL OMNIPAQUE  IOHEXOL  300 MG/ML  SOLN COMPARISON:  PET CT 08/08/2023. FINDINGS: Pharynx and larynx: Streak/beam hardening artifact arising from dental restoration partially obscures the oral cavity. No appreciable swelling or mass within the oral cavity, pharynx or larynx. Salivary glands: No inflammation, mass, or stone. Thyroid: Unremarkable. Lymph nodes: No pathologically enlarged lymph nodes identified within the neck. Vascular: The major vascular structures of the neck are patent. Limited intracranial: No evidence of an acute intracranial abnormality within the field of view. Visualized orbits: No orbital mass or acute orbital finding. Mastoids and visualized paranasal sinuses: Portions of the frontal sinuses are excluded from the field of view superiorly. Mild mucosal thickening within the bilateral maxillary sinuses. No significant mastoid effusion. Skeleton: No acute fracture or aggressive osseous lesion. Upper chest: Separately reported on same day chest CT. IMPRESSION: 1. No evidence of nodal metastatic disease within the neck. 2. Mild bilateral maxillary sinus mucosal thickening. 3.  Same day chest CT findings separately reported. Electronically Signed   By: Rockey Childs D.O.   On: 04/28/2024 15:02   CT Chest W Contrast Result Date: 04/25/2024 CLINICAL DATA:  Staging non-small-cell lung cancer. * Tracking Code: BO * EXAM: CT CHEST WITH CONTRAST TECHNIQUE: Multidetector CT imaging of the chest was performed during intravenous contrast administration. RADIATION DOSE REDUCTION: This exam was performed according to the departmental dose-optimization program which includes automated exposure control, adjustment of the mA and/or kV according to patient size and/or use of iterative reconstruction technique. CONTRAST:  75mL OMNIPAQUE  IOHEXOL  300 MG/ML  SOLN COMPARISON:  CT 03/06/2024 and older FINDINGS: Cardiovascular: Thoracic aorta is normal course and caliber with some scattered calcified plaque. The heart is enlarged. No pericardial effusion. Coronary artery calcifications are seen. 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 Mediastinum/Nodes: Patulous esophagus with some air in fluid. Preserved thyroid gland. No specific abnormal lymph node enlargement identified in the axillary regions, hilum or mediastinum. No specific abnormal lymph node enlargement identified in the axillary regions hila. There is some prominent mediastinal nodes again identified. Example includes precarinal node right of midline which on the prior measured 11 x 11 mm and today 12 x 11 mm on image 23 of series  3. Other nodes in the mediastinum are similar. Lungs/Pleura: On the prior there is some ground-glass and parenchymal consolidative opacities scattered both lungs somewhat perihilar right greater than left. These areas are much more extensive confluent and more consolidative. Ronnald areas include the right upper lobe, lower lobe and left lower lobe. Distribution is similar. Some components of ground-glass. But again more consolidative. No pneumothorax or effusion. Areas of bronchial wall thickening. Upper  Abdomen: Adrenal glands are preserved in the upper abdomen. Previous cholecystectomy. Stable biliary duct ectasia. Musculoskeletal: Scattered degenerative changes along the spine. Surgical clips in the right breast. IMPRESSION: Significant increase in perihilar opacities now much more consolidative and more extensive but in a similar distribution to previous. Please correlate for etiology including infectious or inflammatory process. A component neoplasm is difficult to completely exclude. Prominent lymph nodes in the mediastinum are similar to previous. Patulous esophagus. Enlarged heart. Aortic Atherosclerosis (ICD10-I70.0). Electronically Signed   By: Ranell Bring M.D.   On: 04/25/2024 13:53   IR REMOVAL TUN ACCESS W/ PORT W/O FL MOD SED Result Date: 04/07/2024 INDICATION: Therapy complete. Planned removal of durable venous access (chest port) EXAM: REMOVAL RIGHT IJ VEIN PORT-A-CATH MEDICATIONS: None; The antibiotic was administered within an appropriate time interval prior to skin puncture. ANESTHESIA/SEDATION: No sedation FLUOROSCOPY: Radiation Exposure Index 0 COMPLICATIONS: None immediate. PROCEDURE: Informed written consent was obtained from the patient after a thorough discussion of the procedural risks, benefits and alternatives. All questions were addressed. Maximal Sterile Barrier Technique was utilized including caps, mask, sterile gowns, sterile gloves, sterile drape, hand hygiene and skin antiseptic. A timeout was performed prior to the initiation of the procedure. The right chest was prepped and draped in a sterile fashion. Lidocaine  was utilized for local anesthesia. An incision was made over the previously healed surgical incision. Utilizing blunt dissection, the port catheter and reservoir were removed from the underlying subcutaneous tissue in their entirety. Securing sutures were also removed. The pocket was irrigated with a copious amount of sterile normal saline. The pocket was closed with  interrupted 3-0 Vicryl stitches. The subcutaneous tissue was closed with 3-0 Vicryl interrupted subcutaneous stitches. A 4-0 Vicryl running subcuticular stitch was utilized to approximate the skin. Dermabond was applied. IMPRESSION: Successful right IJ vein Port-A-Cath explant. Electronically Signed   By: Cordella Banner   On: 04/07/2024 10:04    ASSESSMENT AND PLAN: This is a very pleasant 73 years old female with stage IIIb (T1c, N3, M0) non-small cell lung cancer, adenocarcinoma presented with right middle lobe lung nodule in addition to bilateral hilar and mediastinal lymphadenopathy diagnosed in December 2024.  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 She is currently on concurrent chemoradiation with weekly carboplatin  for AUC of 2 and paclitaxel  45 Mg/M2.  First dose October 29, 2023.  She is status post 6 cycles.  She has been tolerating the treatment well except for the fatigue as well as radiation-induced odynophagia and dysphagia with poor p.o. intake.  She is recovering well from the previous course of concurrent chemoradiation.   She is currently on treatment with Tagrisso  80 mg p.o. daily started January 18, 2024.  She has been tolerating this treatment fairly well with no concerning adverse effects. She had repeat CT scan of the neck and chest performed recently.  I personally independently reviewed the scan and discussed the result with the patient and her husband.  Her scan showed no concerning findings for disease progression in  the neck but she has increased airspace disease bilaterally in the lung concerning for evolving radiation changes or drug-induced pneumonitis. Assessment and Plan Assessment & Plan Stage 3B non-small cell lung cancer, adenocarcinoma Stage 3B non-small cell lung cancer, adenocarcinoma with EGFR G719C mutation and E709V amplification, and PD-L1 expression of 30%. Underwent concurrent  chemoradiation with partial response. Currently on consolidation treatment with Tagrisso  since March 2025. Recent CT scan shows no nodal metastasis in the neck. - Hold Tagrisso  for 1-2 weeks until re-evaluation - Follow-up in 2 weeks to reassess symptoms and treatment response  Radiation-induced pneumonitis Increased inflammation in both lungs, likely due to previous radiation and possibly exacerbated by Tagrisso . Symptoms include dry cough and decreased appetite. - Initiate high-dose prednisone, tapering over several weeks - Prescribe Prilosec or Nexium for acid reflux - Consider antibiotics if infection is suspected  Cough Persistent dry cough, likely related to radiation-induced pneumonitis and possibly exacerbated by Tagrisso . - Prescribe Hycodan for cough management  Decreased appetite Decreased appetite, possibly related to lung inflammation and treatment side effects. High-dose prednisone may help improve appetite. - High-dose prednisone may help improve appetite She was advised to call immediately if she has any concerning symptoms in the interval.  The patient voices understanding of current disease status and treatment options and is in agreement with the current care plan.  All questions were answered. The patient knows to call the clinic with any problems, questions or concerns. We can certainly see the patient much sooner if necessary.  The total time spent in the appointment was 30 minutes including review of chart and various tests results, discussions about plan of care and coordination of care plan .   Disclaimer: This note was dictated with voice recognition software. Similar sounding words can inadvertently be transcribed and may not be corrected upon review.

## 2024-04-30 ENCOUNTER — Telehealth: Payer: Self-pay | Admitting: Internal Medicine

## 2024-04-30 NOTE — Telephone Encounter (Signed)
 Scheduled appointments with the patients husband.

## 2024-05-01 ENCOUNTER — Other Ambulatory Visit: Payer: Self-pay

## 2024-05-05 ENCOUNTER — Telehealth: Payer: Self-pay

## 2024-05-05 ENCOUNTER — Other Ambulatory Visit: Payer: Self-pay

## 2024-05-05 NOTE — Telephone Encounter (Signed)
 Patient's husband LVM regarding reaction to Hycodan syrup and concerns with taking prednisone .  Spoke with husband regarding the reaction to Hycodan. He reported that the patient took Hycodan the night of 7/3 and then three times on 7/4. She developed a rash and itching all over her body. She discontinued the Hycodan, and the rash and itching resolved. As of today, there is no rash and no itching.  Regarding prednisone , husband inquired if the patient could take 3 tablets in the morning and 2 at lunch. Informed him that our recommendation is to take all tablets in the morning, but if preferred, it is acceptable to split the dose between breakfast and lunch--just not to take any after 3 P.M. He voiced understanding and was advised to call with any further concerns or questions.

## 2024-05-06 ENCOUNTER — Other Ambulatory Visit: Payer: Self-pay

## 2024-05-06 NOTE — Progress Notes (Signed)
 Specialty Pharmacy Ongoing Clinical Assessment Note  Ashley Pratt is a 73 y.o. female who is being followed by the specialty pharmacy service for RxSp Oncology   Patient's specialty medication(s) reviewed today: Osimertinib  Mesylate (TAGRISSO )   Missed doses in the last 4 weeks: 7   Patient/Caregiver did not have any additional questions or concerns.   Therapeutic benefit summary: Patient is achieving benefit   Adverse events/side effects summary: Experienced adverse events/side effects (Pneumonitis likely from radiation per provider notes aggravated by Tagrisso )   Patient's therapy is appropriate to: Hold (Tagrisso  being held until at least follow up appointment 7/22)    Goals Addressed             This Visit's Progress    Slow Disease Progression       Patient is on track. Patient will be monitored by provider to determine if a change in treatment plan is warranted due to side effects.          Follow up: 3 months  Ashley Pratt M Tramon Crescenzo Specialty Pharmacist

## 2024-05-14 ENCOUNTER — Encounter: Payer: Self-pay | Admitting: Internal Medicine

## 2024-05-16 NOTE — Progress Notes (Unsigned)
 [facility] OFFICE PROGRESS NOTE  Ransom Other, MD [address] [place] [address]  DIAGNOSIS:  Stage IIIb (T1c, N3, M0) non-small cell lung cancer, adenocarcinoma presented with right middle lobe lung nodule in addition to bilateral hilar and mediastinal lymphadenopathy diagnosed in December 2024.    Biomarker Findings HRD signature - HRDsig Negative Microsatellite status - MS-Stable Tumor Mutational Burden - 2 Muts/Mb Genomic Findings For a complete list of the genes assayed, please refer to the Appendix. EGFR G719C, E709V, amplification CDKN2A loss - equivocal? MTAP loss - equivocal? CDKN2B loss - equivocal? FGF23 R187W - subclonal? TP53 R248L 7 Disease relevant genes with no reportable alterations: ALK, BRAF, ERBB2, KRAS, MET, RET, ROS1   PDL1 Expression 30%    PRIOR THERAPY: Concurrent chemoradiation with weekly carboplatin  for AUC of 2 and paclitaxel  45 Mg/M2. First dose October 29, 2023. Status post 6 cycles.   CURRENT THERAPY: Consolidation treatment with Tagrisso  80 mg p.o. daily. First dose on 01/18/24. On hold in July 2025 due to questionable pneumonitis.   INTERVAL HISTORY: Ashley Pratt 72 y.o. female returns to the clinic today for a follow up visit. The patient was last seen in the clinic by Dr. Sherrod on 04/29/24. At that time, she had a restaging CT scan that showed some inflammatory process in the lung which could represent evolving radiation changes or drug-induced pneumonitis. She was having was having symptoms of a dry cough.   Her treatment with Tagrisso  on is on hold and she is on a high dose prednisone  taper.   Her breathing and cough has improved. She states her cough is about 80% better at this time. She is currently on 20 mg. She is going to start her 1 week of 10 mg on 05/25/24. She had intolerance to hycodan and may be allergic to codeine and we have put this on allergy list. She had a rash which resolved at  this time. She denies fever, chills, night sweats, or unexplained weight loss. She continues to drink 2 boost per day. She denies hemoptysis or chest pain.   She denies any nausea, vomiting, diarrhea, or constipation.  She is here for evaluation and repeat blood work.    MEDICAL HISTORY: Past Medical History:  Diagnosis Date   Aortic stenosis    Arthritis    Breast cancer (HCC)    Chronic back pain    Colon polyps    GERD (gastroesophageal reflux disease)    Hypertension    Malignant neoplasm of right female breast (HCC)    unspecified site of breast   Osteoarthritis    of the knee left worse than right   Prediabetes     ALLERGIES:  is allergic to aspirin, gabapentin, losartan  potassium-hctz, penicillins, and hycodan [hydrocodone  bit-homatrop mbr].  MEDICATIONS:  Current Outpatient Medications  Medication Sig Dispense Refill   losartan  (COZAAR ) 100 MG tablet Take 100 mg by mouth daily.     predniSONE  (DELTASONE ) 10 MG tablet 5 tablet p.o. daily for 1 week followed by 3 tablets p.o. daily for 1 week followed by 2 tablets p.o. daily 1 week and then 1 tablet p.o. daily for 1 week 77 tablet 0   doxycycline  (VIBRA -TABS) 100 MG tablet Take 1 tablet (100 mg total) by mouth 2 (two) times daily. (Patient not taking: Reported on 05/19/2024) 20 tablet 0   HYDROcodone  bit-homatropine (HYCODAN) 5-1.5 MG/5ML syrup Take 5 mLs by mouth every 6 (six) hours as needed for cough. 120 mL 0  lidocaine -prilocaine  (EMLA ) cream Apply 1 Application topically as needed. (Patient not taking: Reported on 05/19/2024) 30 g 2   magnesium gluconate (MAGONATE) 500 MG tablet Take 500 mg by mouth 2 (two) times daily. (Patient not taking: Reported on 05/19/2024)     Multiple Vitamin (MULTIVITAMIN) capsule Take 1 capsule by mouth daily. (Patient not taking: Reported on 05/19/2024)     osimertinib  mesylate (TAGRISSO ) 80 MG tablet Take 1 tablet (80 mg total) by mouth daily. (Patient not taking: Reported on 05/19/2024) 30  tablet 2   No current facility-administered medications for this visit.    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   BREAST LUMPECTOMY WITH NEEDLE LOCALIZATION Right 05/11/2013   Procedure: RIGHT BREAST NEEDLE LOCALIZATION  LUMPECTOMY;  Surgeon: Jina Nephew, MD;  Location: [facility];  Service: General;  Laterality: Right;   BREAST LUMPECTOMY WITH RADIOACTIVE SEED LOCALIZATION Right 08/02/2020   Procedure: RIGHT BREAST LUMPECTOMY WITH RADIOACTIVE SEED LOCALIZATION;  Surgeon: Nephew Jina, MD;  Location: [facility];  Service: General;  Laterality: Right;  RNFA   BRONCHIAL BIOPSY  10/07/2023   Procedure: BRONCHIAL BIOPSIES;  Surgeon: Shelah Lamar RAMAN, MD;  Location: [facility] ENDOSCOPY;  Service: Pulmonary;;   BRONCHIAL BRUSHINGS  10/07/2023   Procedure: BRONCHIAL BRUSHINGS;  Surgeon: Shelah Lamar RAMAN, MD;  Location: [facility] ENDOSCOPY;  Service: Pulmonary;;   BRONCHIAL NEEDLE ASPIRATION BIOPSY  10/07/2023   Procedure: BRONCHIAL NEEDLE ASPIRATION BIOPSIES;  Surgeon: Shelah Lamar RAMAN, MD;  Location: [facility] ENDOSCOPY;  Service: Pulmonary;;   CHOLECYSTECTOMY     COLONOSCOPY     FOOT OSTEOTOMY     both  feet   IR CV LINE INJECTION  02/20/2024   IR IMAGING GUIDED PORT INSERTION  11/07/2023   IR REMOVAL TUN ACCESS W/ PORT W/O FL MOD SED  04/07/2024   TONSILLECTOMY     VIDEO BRONCHOSCOPY WITH ENDOBRONCHIAL ULTRASOUND N/A 10/07/2023   Procedure: VIDEO BRONCHOSCOPY WITH ENDOBRONCHIAL ULTRASOUND;  Surgeon: Shelah Lamar RAMAN, MD;  Location: [facility] ENDOSCOPY;  Service: Pulmonary;  Laterality: N/A;    REVIEW OF SYSTEMS:   Review of Systems  Constitutional: Negative for appetite change, chills, fatigue, fever and unexpected weight change.  HENT: Negative for mouth sores, nosebleeds, sore throat and trouble swallowing.   Eyes:  Negative for eye problems and icterus.  Respiratory: Improved cough and shortness of breath. Negative for hemoptysis and wheezing.   Cardiovascular: Negative for chest pain and leg swelling.  Gastrointestinal: Negative for abdominal pain, constipation, diarrhea, nausea and vomiting.  Genitourinary: Negative for bladder incontinence, difficulty urinating, dysuria, frequency and hematuria.   Musculoskeletal: Negative for back pain, gait problem, neck pain and neck stiffness.  Skin: Negative for itching and rash.  Neurological: Negative for dizziness, extremity weakness, gait problem, headaches, light-headedness and seizures.  Hematological: Negative for adenopathy. Does not bruise/bleed easily.  Psychiatric/Behavioral: Negative for confusion, depression and sleep disturbance. The patient is not nervous/anxious.     PHYSICAL EXAMINATION:  Blood pressure (!) 149/73, pulse 90, temperature (!) 97.4 F (36.3 C), temperature source Temporal, resp. rate 16, weight 125 lb 12.8 oz (57.1 kg), SpO2 97%.  ECOG PERFORMANCE STATUS: 1  Physical Exam  Constitutional: Oriented to person, place, and time and well-developed, well-nourished, and in no distress.  HENT:  Head: Normocephalic and atraumatic.  Mouth/Throat: Oropharynx is clear and moist. No  oropharyngeal exudate.  Eyes: Conjunctivae are normal. Right eye exhibits no discharge. Left eye exhibits no discharge. No scleral icterus.  Neck: Normal range of motion. Neck supple.  Cardiovascular: Normal rate, regular rhythm, murmur noted and intact distal pulses.   Pulmonary/Chest: Effort normal and breath sounds normal. No respiratory distress. No wheezes. No rales.  Abdominal: Soft. Bowel sounds are normal. Exhibits no distension and no mass. There is no tenderness.  Musculoskeletal: Normal range of motion. Exhibits no edema.  Lymphadenopathy:    No cervical adenopathy.  Neurological: Alert and oriented to person, place, and time. Exhibits normal  muscle tone. Gait normal. Coordination normal.  Skin: Skin is warm and dry. No rash noted. Not diaphoretic. No erythema. No pallor.  Psychiatric: Mood, memory and judgment normal.  Vitals reviewed.  LABORATORY DATA: Lab Results  Component Value Date   WBC 12.2 (H) 05/19/2024   HGB 11.4 (L) 05/19/2024   HCT 34.3 (L) 05/19/2024   MCV 73.4 (L) 05/19/2024   PLT 169 05/19/2024      Chemistry      Component Value Date/Time   NA 133 (L) 05/19/2024 1026   NA 139 10/17/2015 1322   K 3.8 05/19/2024 1026   K 4.1 10/17/2015 1322   CL 96 (L) 05/19/2024 1026   CO2 31 05/19/2024 1026   CO2 28 10/17/2015 1322   BUN 26 (H) 05/19/2024 1026   BUN 14.5 10/17/2015 1322   CREATININE 0.81 05/19/2024 1026   CREATININE 0.87 04/10/2016 1540   CREATININE 0.9 10/17/2015 1322      Component Value Date/Time   CALCIUM 9.0 05/19/2024 1026   CALCIUM 10.1 10/17/2015 1322   ALKPHOS 47 05/19/2024 1026   ALKPHOS 103 10/17/2015 1322   AST 16 05/19/2024 1026   AST 24 10/17/2015 1322   ALT 17 05/19/2024 1026   ALT 32 10/17/2015 1322   BILITOT 0.6 05/19/2024 1026   BILITOT 0.33 10/17/2015 1322       RADIOGRAPHIC STUDIES:  CT Soft Tissue Neck W Contrast Result Date: 04/28/2024 CLINICAL DATA:  Provided history: Malignant neoplasm of unspecified part of unspecified bronchus or lung. Neck mass, non-pulsatile EXAM: CT NECK WITH CONTRAST TECHNIQUE: Multidetector CT imaging of the neck was performed using the standard protocol following the bolus administration of intravenous contrast. RADIATION DOSE REDUCTION: This exam was performed according to the departmental dose-optimization program which includes automated exposure control, adjustment of the mA and/or kV according to patient size and/or use of iterative reconstruction technique. CONTRAST:  75mL OMNIPAQUE  IOHEXOL  300 MG/ML  SOLN COMPARISON:  PET CT 08/08/2023. FINDINGS: Pharynx and larynx: Streak/beam hardening artifact arising from dental restoration  partially obscures the oral cavity. No appreciable swelling or mass within the oral cavity, pharynx or larynx. Salivary glands: No inflammation, mass, or stone. Thyroid: Unremarkable. Lymph nodes: No pathologically enlarged lymph nodes identified within the neck. Vascular: The major vascular structures of the neck are patent. Limited intracranial: No evidence of an acute intracranial abnormality within the field of view. Visualized orbits: No orbital mass or acute orbital finding. Mastoids and visualized paranasal sinuses: Portions of the frontal sinuses are excluded from the field of view superiorly. Mild mucosal thickening within the bilateral maxillary sinuses. No significant mastoid effusion. Skeleton: No acute fracture or aggressive osseous lesion. Upper chest: Separately reported on same day chest CT. IMPRESSION: 1. No evidence of nodal metastatic disease within the neck. 2. Mild bilateral maxillary sinus mucosal thickening. 3. Same day chest CT findings separately reported. Electronically Signed   By: Rockey  Richelle D.O.   On: 04/28/2024 15:02   CT Chest W Contrast Result Date: 04/25/2024 CLINICAL DATA:  Staging non-small-cell lung cancer. * Tracking Code: BO * EXAM: CT CHEST WITH CONTRAST TECHNIQUE: Multidetector CT imaging of the chest was performed during intravenous contrast administration. RADIATION DOSE REDUCTION: This exam was performed according to the departmental dose-optimization program which includes automated exposure control, adjustment of the mA and/or kV according to patient size and/or use of iterative reconstruction technique. CONTRAST:  75mL OMNIPAQUE  IOHEXOL  300 MG/ML  SOLN COMPARISON:  CT 03/06/2024 and older FINDINGS: Cardiovascular: Thoracic aorta is normal course and caliber with some scattered calcified plaque. The heart is enlarged. No pericardial effusion. Coronary artery calcifications are seen. 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 Mediastinum/Nodes: Patulous  esophagus with some air in fluid. Preserved thyroid gland. No specific abnormal lymph node enlargement identified in the axillary regions, hilum or mediastinum. No specific abnormal lymph node enlargement identified in the axillary regions hila. There is some prominent mediastinal nodes again identified. Example includes precarinal node right of midline which on the prior measured 11 x 11 mm and today 12 x 11 mm on image 23 of series 3. Other nodes in the mediastinum are similar. Lungs/Pleura: On the prior there is some ground-glass and parenchymal consolidative opacities scattered both lungs somewhat perihilar right greater than left. These areas are much more extensive confluent and more consolidative. Ronnald areas include the right upper lobe, lower lobe and left lower lobe. Distribution is similar. Some components of ground-glass. But again more consolidative. No pneumothorax or effusion. Areas of bronchial wall thickening. Upper Abdomen: Adrenal glands are preserved in the upper abdomen. Previous cholecystectomy. Stable biliary duct ectasia. Musculoskeletal: Scattered degenerative changes along the spine. Surgical clips in the right breast. IMPRESSION: Significant increase in perihilar opacities now much more consolidative and more extensive but in a similar distribution to previous. Please correlate for etiology including infectious or inflammatory process. A component neoplasm is difficult to completely exclude. Prominent lymph nodes in the mediastinum are similar to previous. Patulous esophagus. Enlarged heart. Aortic Atherosclerosis (ICD10-I70.0). Electronically Signed   By: Ranell Bring M.D.   On: 04/25/2024 13:53     ASSESSMENT/PLAN:  This is a very pleasant 73 year old female followed by the clinic for stage 3B non-small cell lung cancer (adenocarcinoma) in December 2024, presents for the initiation of her first treatment with chemo and radiation. The patient's molecular markers were tested,  revealing an uncommon EGFR mutation and a PD-L1 exhalation of 30%, both of which could be utilized for future treatment.    Completed 6 cycles of concurrent chemoradiation with weekly carboplatin  for an AUC of 2 and paclitaxel  45 mg/m.  The first dose was on 10/29/2023.   She was last seen by Dr. Sherrod on 01/14/2024.  She is positive for EGFR mutation.  Therefore Dr. Sherrod recommended initiation of Tagrisso  80 mg p.o. daily.  He started this on 01/17/2024 and thus far has been tolerating it well except in July she had evidence of possible pneumonitis vs evolving radiation changes.   She is currently on prednisone  with 20 mg.   She will continue her prednisone  taper.  She is expected to start 10 mg of prednisone  next week on 05/25/2024 but if she has any worsening symptoms she knows to call us  sooner.  We will then tentatively see her back in approximately 2 weeks when she completes her prednisone  taper discussed next steps.    Will continue to hold Tagrisso   for now until she sees Dr. Sherrod back for labs and a follow-up visit.  I double checked with Dr. Sherrod to see if she needs a repeat CT scan.  Dr. Sherrod will determine interval/timing of restaging CT scan when he sees her back in 2 weeks.  The patient was advised to call immediately if she has any concerning symptoms in the interval. The patient voices understanding of current disease status and treatment options and is in agreement with the current care plan. All questions were answered. The patient knows to call the clinic with any problems, questions or concerns. We can certainly see the patient much sooner if necessary    Orders Placed This Encounter  Procedures   CBC with Differential (Cancer Center Only)    Standing Status:   Future    Expected Date:   06/02/2024    Expiration Date:   05/19/2025   CMP (Cancer Center only)    Standing Status:   Future    Expected Date:   06/02/2024    Expiration Date:   05/19/2025     The  total time spent in the appointment was 20-29 minutes  Terriann Difonzo L Terre Zabriskie, PA-C 05/19/24

## 2024-05-18 ENCOUNTER — Telehealth: Payer: Self-pay | Admitting: Medical Oncology

## 2024-05-18 NOTE — Telephone Encounter (Signed)
 Ashley Pratt has 2 more weeks of prednisone . She feels much better. Does she need to keep her appt tomorrow @ 1030 ?

## 2024-05-18 NOTE — Telephone Encounter (Signed)
 I instructed Mr. Ashley Pratt for pt to keep appt this week.

## 2024-05-19 ENCOUNTER — Inpatient Hospital Stay

## 2024-05-19 ENCOUNTER — Inpatient Hospital Stay: Admitting: Physician Assistant

## 2024-05-19 VITALS — BP 149/73 | HR 90 | Temp 97.4°F | Resp 16 | Wt 125.8 lb

## 2024-05-19 DIAGNOSIS — C342 Malignant neoplasm of middle lobe, bronchus or lung: Secondary | ICD-10-CM

## 2024-05-19 LAB — CBC WITH DIFFERENTIAL (CANCER CENTER ONLY)
Abs Immature Granulocytes: 0.06 K/uL (ref 0.00–0.07)
Basophils Absolute: 0 K/uL (ref 0.0–0.1)
Basophils Relative: 0 %
Eosinophils Absolute: 0.1 K/uL (ref 0.0–0.5)
Eosinophils Relative: 1 %
HCT: 34.3 % — ABNORMAL LOW (ref 36.0–46.0)
Hemoglobin: 11.4 g/dL — ABNORMAL LOW (ref 12.0–15.0)
Immature Granulocytes: 1 %
Lymphocytes Relative: 6 %
Lymphs Abs: 0.8 K/uL (ref 0.7–4.0)
MCH: 24.4 pg — ABNORMAL LOW (ref 26.0–34.0)
MCHC: 33.2 g/dL (ref 30.0–36.0)
MCV: 73.4 fL — ABNORMAL LOW (ref 80.0–100.0)
Monocytes Absolute: 1.1 K/uL — ABNORMAL HIGH (ref 0.1–1.0)
Monocytes Relative: 9 %
Neutro Abs: 10.2 K/uL — ABNORMAL HIGH (ref 1.7–7.7)
Neutrophils Relative %: 83 %
Platelet Count: 169 K/uL (ref 150–400)
RBC: 4.67 MIL/uL (ref 3.87–5.11)
RDW: 17.7 % — ABNORMAL HIGH (ref 11.5–15.5)
WBC Count: 12.2 K/uL — ABNORMAL HIGH (ref 4.0–10.5)
nRBC: 0 % (ref 0.0–0.2)

## 2024-05-19 LAB — CMP (CANCER CENTER ONLY)
ALT: 17 U/L (ref 0–44)
AST: 16 U/L (ref 15–41)
Albumin: 3.6 g/dL (ref 3.5–5.0)
Alkaline Phosphatase: 47 U/L (ref 38–126)
Anion gap: 6 (ref 5–15)
BUN: 26 mg/dL — ABNORMAL HIGH (ref 8–23)
CO2: 31 mmol/L (ref 22–32)
Calcium: 9 mg/dL (ref 8.9–10.3)
Chloride: 96 mmol/L — ABNORMAL LOW (ref 98–111)
Creatinine: 0.81 mg/dL (ref 0.44–1.00)
GFR, Estimated: >60 mL/min (ref >60)
Glucose, Bld: 96 mg/dL (ref 70–99)
Potassium: 3.8 mmol/L (ref 3.5–5.1)
Sodium: 133 mmol/L — ABNORMAL LOW (ref 135–145)
Total Bilirubin: 0.6 mg/dL (ref 0.0–1.2)
Total Protein: 6.3 g/dL — ABNORMAL LOW (ref 6.5–8.1)

## 2024-05-20 ENCOUNTER — Telehealth: Payer: Self-pay | Admitting: *Deleted

## 2024-05-20 ENCOUNTER — Other Ambulatory Visit: Payer: Self-pay

## 2024-05-20 NOTE — Telephone Encounter (Signed)
 Mr. Tapper called and left message that his wife was still having hand and shoulder cramps. He said he'd left a message last night with the after hours triage service.  Contacted Mr. Schindel for further information. He described his wife's hands cramping last evening and again this afternoon, stating it's been going on for about 10 days. He said she is following the directions for the prednisone  taper - taking  2 tablets daily this week. He asked if maybe stopping the other medicine was reason for the cramping. He said his wife reports the cramps are painful while they are happening and she is not able to use her hands. Once they stop, she can use her hands again and the pain eases.  Ms. Heilingoetter, PA informed of Mr. Windhorst message and concerns r/t spouse's muscle cramps.  She recommends Ashley Pratt stay well hydrated, adding electrolyte drinks for some of her fluids. Ashley Pratt can try warm compresses or have someone massage her shoulders and hands. If the cramping persists, recommend patient seek evaluation with PCP. The prednisone  taper was discussed when Ashley Pratt had appt with Ms. Heilingoetter, PA yesterday. Patient should complete taper as directed and not stop prednisone  abruptly due to side effects.   Contacted Mr.Bollman with Ms. Heilingoetter's recommendations. Mr. adaira centola understanding of directions and was encouraged to contact office for further questions or concerns.

## 2024-05-21 ENCOUNTER — Other Ambulatory Visit: Payer: Self-pay

## 2024-05-27 ENCOUNTER — Other Ambulatory Visit: Payer: Self-pay

## 2024-05-29 NOTE — Progress Notes (Signed)
 [facility] OFFICE PROGRESS NOTE  Ransom Other, MD [address] [place] [address]  DIAGNOSIS: Stage IIIb (T1c, N3, M0) non-small cell lung cancer, adenocarcinoma presented with right middle lobe lung nodule in addition to bilateral hilar and mediastinal lymphadenopathy diagnosed in December 2024.    Biomarker Findings HRD signature - HRDsig Negative Microsatellite status - MS-Stable Tumor Mutational Burden - 2 Muts/Mb Genomic Findings For a complete list of the genes assayed, please refer to the Appendix. EGFR G719C, E709V, amplification CDKN2A loss - equivocal? MTAP loss - equivocal? CDKN2B loss - equivocal? FGF23 R187W - subclonal? TP53 R248L 7 Disease relevant genes with no reportable alterations: ALK, BRAF, ERBB2, KRAS, MET, RET, ROS1   PDL1 Expression 30%  PRIOR THERAPY:  Concurrent chemoradiation with weekly carboplatin  for AUC of 2 and paclitaxel  45 Mg/M2. First dose October 29, 2023. Status post 6 cycles.   CURRENT THERAPY: Consolidation treatment with Tagrisso  80 mg p.o. daily. First dose on 01/18/24. On hold in July 2025 due to questionable pneumonitis.   INTERVAL HISTORY: Ashley Pratt 73 y.o. female returns to the clinic today for a follow up visit. When the patient was seen in the clinic by Dr. Sherrod on 04/29/24, a restaging CT scan that showed some inflammatory process in the lung which could represent evolving radiation changes or drug-induced pneumonitis. She was having was having symptoms of a dry cough.   Her treatment with Tagrisso  is on hold and she was on a high dose prednisone  taper.   She completed her prednisone  taper on Sunday 05/31/24. She feels almost at her baseline but not 100%. Her cough has improved and she only coughs sometimes, mostly at night. Her shortness of breath is better than it was prior as well.   Her main issue is related to cramping in the hands and feet. She drinks plenty of water but does not  drink electrolyte drinks due to not liking the taste. She also had some insomnia but does not want to take any medication for this.   She had a rash which resolved at this time. She denies fever, chills, night sweats, or unexplained weight loss. She has a good appetite.  She takes one boost every few days. Her appetite is good. She denies hemoptysis or chest pain. She denies any nausea, vomiting, diarrhea, or constipation.  She is here for evaluation and repeat blood work.     MEDICAL HISTORY: Past Medical History:  Diagnosis Date   Aortic stenosis    Arthritis    Breast cancer (HCC)    Chronic back pain    Colon polyps    GERD (gastroesophageal reflux disease)    Hypertension    Malignant neoplasm of right female breast (HCC)    unspecified site of breast   Osteoarthritis    of the knee left worse than right   Prediabetes     ALLERGIES:  is allergic to aspirin, gabapentin, losartan  potassium-hctz, penicillins, and hycodan [hydrocodone  bit-homatrop mbr].  MEDICATIONS:  Current Outpatient Medications  Medication Sig Dispense Refill   doxycycline  (VIBRA -TABS) 100 MG tablet Take 1 tablet (100 mg total) by mouth 2 (two) times daily. (Patient not taking: Reported on 05/19/2024) 20 tablet 0   HYDROcodone  bit-homatropine (HYCODAN) 5-1.5 MG/5ML syrup Take 5 mLs by mouth every 6 (six) hours as needed for cough. 120 mL 0   lidocaine -prilocaine  (EMLA ) cream Apply 1 Application topically as needed. (Patient not taking: Reported on 05/19/2024) 30 g 2   losartan  (COZAAR )  100 MG tablet Take 100 mg by mouth daily.     magnesium gluconate (MAGONATE) 500 MG tablet Take 500 mg by mouth 2 (two) times daily. (Patient not taking: Reported on 05/19/2024)     Multiple Vitamin (MULTIVITAMIN) capsule Take 1 capsule by mouth daily. (Patient not taking: Reported on 05/19/2024)     osimertinib  mesylate (TAGRISSO ) 80 MG tablet Take 1 tablet (80 mg total) by mouth daily. (Patient not taking: Reported on 05/19/2024)  30 tablet 2   predniSONE  (DELTASONE ) 10 MG tablet 5 tablet p.o. daily for 1 week followed by 3 tablets p.o. daily for 1 week followed by 2 tablets p.o. daily 1 week and then 1 tablet p.o. daily for 1 week 77 tablet 0   No current facility-administered medications for this visit.    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   BREAST LUMPECTOMY WITH NEEDLE LOCALIZATION Right 05/11/2013   Procedure: RIGHT BREAST NEEDLE LOCALIZATION  LUMPECTOMY;  Surgeon: Jina Nephew, MD;  Location: [facility];  Service: General;  Laterality: Right;   BREAST LUMPECTOMY WITH RADIOACTIVE SEED LOCALIZATION Right 08/02/2020   Procedure: RIGHT BREAST LUMPECTOMY WITH RADIOACTIVE SEED LOCALIZATION;  Surgeon: Nephew Jina, MD;  Location: [facility];  Service: General;  Laterality: Right;  RNFA   BRONCHIAL BIOPSY  10/07/2023   Procedure: BRONCHIAL BIOPSIES;  Surgeon: Shelah Lamar RAMAN, MD;  Location[facility] ENDOSCOPY;  Service: Pulmonary;;   BRONCHIAL BRUSHINGS  10/07/2023   Procedure: BRONCHIAL BRUSHINGS;  Surgeon: Shelah Lamar RAMAN, MD;  Location: [facility] ENDOSCOPY;  Service: Pulmonary;;   BRONCHIAL NEEDLE ASPIRATION BIOPSY  10/07/2023   Procedure: BRONCHIAL NEEDLE ASPIRATION BIOPSIES;  Surgeon: Shelah Lamar RAMAN, MD;  Location: [facility] ENDOSCOPY;  Service: Pulmonary;;   CHOLECYSTECTOMY     COLONOSCOPY     FOOT OSTEOTOMY     both  feet   IR CV LINE INJECTION  02/20/2024   IR IMAGING GUIDED PORT INSERTION  11/07/2023   IR REMOVAL TUN ACCESS W/ PORT W/O FL MOD SED  04/07/2024   TONSILLECTOMY     VIDEO BRONCHOSCOPY WITH ENDOBRONCHIAL ULTRASOUND N/A 10/07/2023   Procedure: VIDEO BRONCHOSCOPY WITH ENDOBRONCHIAL ULTRASOUND;  Surgeon: Shelah Lamar RAMAN, MD;  Location: [facility] ENDOSCOPY;  Service: Pulmonary;  Laterality: N/A;    REVIEW OF SYSTEMS:   Review  of Systems  Constitutional: Negative for appetite change, chills, fatigue, fever and unexpected weight change.  HENT: Negative for mouth sores, nosebleeds, sore throat and trouble swallowing.   Eyes: Negative for eye problems and icterus.  Respiratory: Improved cough and shortness of breath. Negative for hemoptysis and wheezing.   Cardiovascular: Negative for chest pain and leg swelling.  Gastrointestinal: Negative for abdominal pain, constipation, diarrhea, nausea and vomiting.  Genitourinary: Negative for bladder incontinence, difficulty urinating, dysuria, frequency and hematuria.   Musculoskeletal: Positive for muscle cramps. Negative for back pain, gait problem, neck pain and neck stiffness.  Skin: Negative for itching and rash.  Neurological: Negative for dizziness, extremity weakness, gait problem, headaches, light-headedness and seizures.  Hematological: Negative for adenopathy. Does not bruise/bleed easily.  Psychiatric/Behavioral: Positive for occasional insomnia. Negative for confusion, depression. The patient is not nervous/anxious.     PHYSICAL EXAMINATION:  There were no vitals taken for this visit.  ECOG PERFORMANCE STATUS: 1  Physical Exam  Constitutional: Oriented to person, place, and time  and well-developed, well-nourished, and in no distress.  HENT:  Head: Normocephalic and atraumatic.  Mouth/Throat: Oropharynx is clear and moist. No oropharyngeal exudate.  Eyes: Conjunctivae are normal. Right eye exhibits no discharge. Left eye exhibits no discharge. No scleral icterus.  Neck: Normal range of motion. Neck supple.  Cardiovascular: Normal rate, regular rhythm, murmur noted and intact distal pulses.   Pulmonary/Chest: Effort normal and breath sounds normal. No respiratory distress. No wheezes. No rales.  Abdominal: Soft. Bowel sounds are normal. Exhibits no distension and no mass. There is no tenderness.  Musculoskeletal: Normal range of motion. Exhibits no edema.   Lymphadenopathy:    No cervical adenopathy.  Neurological: Alert and oriented to person, place, and time. Exhibits normal muscle tone. Gait normal. Coordination normal.  Skin: Skin is warm and dry. No rash noted. Not diaphoretic. No erythema. No pallor.  Psychiatric: Mood, memory and judgment normal.  Vitals reviewed.  LABORATORY DATA: Lab Results  Component Value Date   WBC 12.2 (H) 05/19/2024   HGB 11.4 (L) 05/19/2024   HCT 34.3 (L) 05/19/2024   MCV 73.4 (L) 05/19/2024   PLT 169 05/19/2024      Chemistry      Component Value Date/Time   NA 133 (L) 05/19/2024 1026   NA 139 10/17/2015 1322   K 3.8 05/19/2024 1026   K 4.1 10/17/2015 1322   CL 96 (L) 05/19/2024 1026   CO2 31 05/19/2024 1026   CO2 28 10/17/2015 1322   BUN 26 (H) 05/19/2024 1026   BUN 14.5 10/17/2015 1322   CREATININE 0.81 05/19/2024 1026   CREATININE 0.87 04/10/2016 1540   CREATININE 0.9 10/17/2015 1322      Component Value Date/Time   CALCIUM 9.0 05/19/2024 1026   CALCIUM 10.1 10/17/2015 1322   ALKPHOS 47 05/19/2024 1026   ALKPHOS 103 10/17/2015 1322   AST 16 05/19/2024 1026   AST 24 10/17/2015 1322   ALT 17 05/19/2024 1026   ALT 32 10/17/2015 1322   BILITOT 0.6 05/19/2024 1026   BILITOT 0.33 10/17/2015 1322       RADIOGRAPHIC STUDIES:  No results found.   ASSESSMENT/PLAN:  This is a very pleasant 73 year old female followed by the clinic for stage 3B non-small cell lung cancer (adenocarcinoma) in December 2024, presents for the initiation of her first treatment with chemo and radiation. The patient's molecular markers were tested, revealing an uncommon EGFR mutation and a PD-L1 exhalation of 30%, both of which could be utilized for future treatment.    Completed 6 cycles of concurrent chemoradiation with weekly carboplatin  for an AUC of 2 and paclitaxel  45 mg/m.  The first dose was on 10/29/2023.  She was last seen by Dr. Sherrod on 01/14/2024.  She is positive for EGFR mutation.  Therefore  Dr. Sherrod recommended initiation of Tagrisso  80 mg p.o. daily.  He started this on 01/17/2024 and thus far has been tolerating it well except in July she had evidence of possible pneumonitis vs evolving radiation changes.    She completed a high dose prednisone  taper.   The patient was seen with Dr. Sherrod. Labs were reviewed.   We will arrange for a restaging CT scan of the chest to be performed next week. Dr. Sherrod will see the patient back a few days later to review the results and plan whether to resume treatment or not. If he resumes treatment, he will consider resuming it at a lower dose (40 mg).   We will see her back  for labs and follow up in 2 weeks to review her scan.   She was given a handout on management of muscle cramping and encouraged to increase her intake of electrolyte drinks.   The patient was advised to call immediately if she has any concerning symptoms in the interval. The patient voices understanding of current disease status and treatment options and is in agreement with the current care plan. All questions were answered. The patient knows to call the clinic with any problems, questions or concerns. We can certainly see the patient much sooner if necessary    No orders of the defined types were placed in this encounter.    Linnae Rasool L Najla Aughenbaugh, PA-C 05/29/24  ADDENDUM: Hematology/Oncology Attending:  I had a face-to-face encounter with the patient today.  I reviewed her records, lab and recommended her care plan.  This is a very pleasant 73 years old female with stage IIIb non-small cell lung cancer, adenocarcinoma diagnosed in December 2024 with positive EGFR mutation G719C as well as E709V amplification.  The patient started a course of concurrent chemoradiation with weekly carboplatin  and paclitaxel  then after the induction phase she started on consolidation treatment with Tagrisso  80 mg p.o. daily on January 18, 2024 but her treatment has been on hold for  the last 4-5 weeks secondary to suspicious pneumonitis. The patient was started on a tapering dose of prednisone  and she is feeling much better close to her baseline. I recommended for her to have repeat CT scan of the chest for evaluation of the pneumonitis before consideration of resuming her treatment with Tagrisso .  If we do we will start her at a lower dose of 40 mg p.o. daily and monitor her closely. The patient and her husband agreed to the current plan. She will come back for follow-up visit in less than 2 weeks for evaluation and discussion of her scan results and further recommendation regarding her condition. She was advised to call immediately if she has any other concerning symptoms in the interval. The total time spent in the appointment was 30 minutes including review of chart and various tests results, discussions about plan of care and coordination of care plan . Disclaimer: This note was dictated with voice recognition software. Similar sounding words can inadvertently be transcribed and may be missed upon review. Sherrod MARLA Sherrod, MD

## 2024-06-02 ENCOUNTER — Inpatient Hospital Stay (HOSPITAL_BASED_OUTPATIENT_CLINIC_OR_DEPARTMENT_OTHER): Admitting: Physician Assistant

## 2024-06-02 ENCOUNTER — Inpatient Hospital Stay: Attending: Hematology

## 2024-06-02 ENCOUNTER — Other Ambulatory Visit: Payer: Self-pay

## 2024-06-02 VITALS — BP 140/77 | HR 106 | Temp 97.5°F | Resp 16 | Wt 127.0 lb

## 2024-06-02 DIAGNOSIS — Z853 Personal history of malignant neoplasm of breast: Secondary | ICD-10-CM | POA: Diagnosis not present

## 2024-06-02 DIAGNOSIS — C342 Malignant neoplasm of middle lobe, bronchus or lung: Secondary | ICD-10-CM | POA: Insufficient documentation

## 2024-06-02 DIAGNOSIS — Z79899 Other long term (current) drug therapy: Secondary | ICD-10-CM | POA: Diagnosis not present

## 2024-06-02 DIAGNOSIS — Z9221 Personal history of antineoplastic chemotherapy: Secondary | ICD-10-CM | POA: Insufficient documentation

## 2024-06-02 DIAGNOSIS — Z923 Personal history of irradiation: Secondary | ICD-10-CM | POA: Diagnosis not present

## 2024-06-02 DIAGNOSIS — Z7952 Long term (current) use of systemic steroids: Secondary | ICD-10-CM | POA: Insufficient documentation

## 2024-06-02 LAB — CBC WITH DIFFERENTIAL (CANCER CENTER ONLY)
Abs Immature Granulocytes: 0.01 K/uL (ref 0.00–0.07)
Basophils Absolute: 0 K/uL (ref 0.0–0.1)
Basophils Relative: 0 %
Eosinophils Absolute: 0.1 K/uL (ref 0.0–0.5)
Eosinophils Relative: 1 %
HCT: 37.3 % (ref 36.0–46.0)
Hemoglobin: 12 g/dL (ref 12.0–15.0)
Immature Granulocytes: 0 %
Lymphocytes Relative: 11 %
Lymphs Abs: 0.7 K/uL (ref 0.7–4.0)
MCH: 24.5 pg — ABNORMAL LOW (ref 26.0–34.0)
MCHC: 32.2 g/dL (ref 30.0–36.0)
MCV: 76.3 fL — ABNORMAL LOW (ref 80.0–100.0)
Monocytes Absolute: 0.4 K/uL (ref 0.1–1.0)
Monocytes Relative: 7 %
Neutro Abs: 5.1 K/uL (ref 1.7–7.7)
Neutrophils Relative %: 81 %
Platelet Count: 211 K/uL (ref 150–400)
RBC: 4.89 MIL/uL (ref 3.87–5.11)
RDW: 19.7 % — ABNORMAL HIGH (ref 11.5–15.5)
WBC Count: 6.3 K/uL (ref 4.0–10.5)
nRBC: 0 % (ref 0.0–0.2)

## 2024-06-02 LAB — CMP (CANCER CENTER ONLY)
ALT: 22 U/L (ref 0–44)
AST: 21 U/L (ref 15–41)
Albumin: 3.9 g/dL (ref 3.5–5.0)
Alkaline Phosphatase: 59 U/L (ref 38–126)
Anion gap: 3 — ABNORMAL LOW (ref 5–15)
BUN: 19 mg/dL (ref 8–23)
CO2: 32 mmol/L (ref 22–32)
Calcium: 8.9 mg/dL (ref 8.9–10.3)
Chloride: 97 mmol/L — ABNORMAL LOW (ref 98–111)
Creatinine: 0.86 mg/dL (ref 0.44–1.00)
GFR, Estimated: >60 mL/min (ref >60)
Glucose, Bld: 132 mg/dL — ABNORMAL HIGH (ref 70–99)
Potassium: 4.3 mmol/L (ref 3.5–5.1)
Sodium: 132 mmol/L — ABNORMAL LOW (ref 135–145)
Total Bilirubin: 0.6 mg/dL (ref 0.0–1.2)
Total Protein: 6.5 g/dL (ref 6.5–8.1)

## 2024-06-03 ENCOUNTER — Other Ambulatory Visit: Payer: Self-pay

## 2024-06-11 ENCOUNTER — Ambulatory Visit (HOSPITAL_COMMUNITY)
Admission: RE | Admit: 2024-06-11 | Discharge: 2024-06-11 | Disposition: A | Discharge status: Home / Self Care | Source: Ambulatory Visit | Attending: Physician Assistant | Admitting: Physician Assistant

## 2024-06-11 DIAGNOSIS — C342 Malignant neoplasm of middle lobe, bronchus or lung: Secondary | ICD-10-CM | POA: Insufficient documentation

## 2024-06-11 DIAGNOSIS — C3490 Malignant neoplasm of unspecified part of unspecified bronchus or lung: Secondary | ICD-10-CM | POA: Diagnosis not present

## 2024-06-11 DIAGNOSIS — R599 Enlarged lymph nodes, unspecified: Secondary | ICD-10-CM | POA: Diagnosis not present

## 2024-06-11 DIAGNOSIS — J984 Other disorders of lung: Secondary | ICD-10-CM | POA: Diagnosis not present

## 2024-06-11 MED ORDER — IOHEXOL 300 MG/ML  SOLN
75.0000 mL | Freq: Once | INTRAMUSCULAR | Status: AC | PRN
  Administered 2024-06-11: 75 mL via INTRAVENOUS

## 2024-06-15 ENCOUNTER — Other Ambulatory Visit: Payer: Self-pay | Admitting: Internal Medicine

## 2024-06-15 DIAGNOSIS — E538 Deficiency of other specified B group vitamins: Secondary | ICD-10-CM | POA: Diagnosis not present

## 2024-06-15 DIAGNOSIS — Z1231 Encounter for screening mammogram for malignant neoplasm of breast: Secondary | ICD-10-CM

## 2024-06-15 DIAGNOSIS — E46 Unspecified protein-calorie malnutrition: Secondary | ICD-10-CM | POA: Diagnosis not present

## 2024-06-15 DIAGNOSIS — C3490 Malignant neoplasm of unspecified part of unspecified bronchus or lung: Secondary | ICD-10-CM | POA: Diagnosis not present

## 2024-06-15 DIAGNOSIS — M8580 Other specified disorders of bone density and structure, unspecified site: Secondary | ICD-10-CM | POA: Diagnosis not present

## 2024-06-15 DIAGNOSIS — Z0000 Encounter for general adult medical examination without abnormal findings: Secondary | ICD-10-CM | POA: Diagnosis not present

## 2024-06-15 DIAGNOSIS — I350 Nonrheumatic aortic (valve) stenosis: Secondary | ICD-10-CM | POA: Diagnosis not present

## 2024-06-15 DIAGNOSIS — G629 Polyneuropathy, unspecified: Secondary | ICD-10-CM | POA: Diagnosis not present

## 2024-06-15 DIAGNOSIS — I10 Essential (primary) hypertension: Secondary | ICD-10-CM | POA: Diagnosis not present

## 2024-06-15 DIAGNOSIS — Z853 Personal history of malignant neoplasm of breast: Secondary | ICD-10-CM | POA: Diagnosis not present

## 2024-06-15 DIAGNOSIS — I519 Heart disease, unspecified: Secondary | ICD-10-CM | POA: Diagnosis not present

## 2024-06-15 DIAGNOSIS — Z23 Encounter for immunization: Secondary | ICD-10-CM | POA: Diagnosis not present

## 2024-06-15 DIAGNOSIS — K219 Gastro-esophageal reflux disease without esophagitis: Secondary | ICD-10-CM | POA: Diagnosis not present

## 2024-06-15 DIAGNOSIS — R7303 Prediabetes: Secondary | ICD-10-CM | POA: Diagnosis not present

## 2024-06-17 ENCOUNTER — Other Ambulatory Visit: Payer: Self-pay

## 2024-06-19 NOTE — Progress Notes (Signed)
 [facility] OFFICE PROGRESS NOTE  Ransom Other, MD [address] [place] [address]  DIAGNOSIS: Stage IIIb (T1c, N3, M0) non-small cell lung cancer, adenocarcinoma presented with right middle lobe lung nodule in addition to bilateral hilar and mediastinal lymphadenopathy diagnosed in December 2024.    Biomarker Findings HRD signature - HRDsig Negative Microsatellite status - MS-Stable Tumor Mutational Burden - 2 Muts/Mb Genomic Findings For a complete list of the genes assayed, please refer to the Appendix. EGFR G719C, E709V, amplification CDKN2A loss - equivocal? MTAP loss - equivocal? CDKN2B loss - equivocal? FGF23 R187W - subclonal? TP53 R248L 7 Disease relevant genes with no reportable alterations: ALK, BRAF, ERBB2, KRAS, MET, RET, ROS1   PDL1 Expression 30%  PRIOR THERAPY:  1) Concurrent chemoradiation with weekly carboplatin  for AUC of 2 and paclitaxel  45 Mg/M2. First dose October 29, 2023. Status post 6 cycles.  2) Consolidation treatment with Tagrisso  80 mg p.o. daily. First dose on 01/18/24. On hold in July 2025 due to questionable pneumonitis.  CURRENT THERAPY: Observation   INTERVAL HISTORY: Ashley Pratt 73 y.o. female returns to the clinic today for a follow-up visit accompanied by her husband.  The patient was last seen in clinic on 06/02/2024.  In summary when the patient was seen in the clinic by Dr. Sherrod on 04/29/2024 the patient had a restaging CT scan that showed some inflammatory process in the lung which could represent evolving radiation changes or drug-induced pneumonitis.  She was also having a cough at that time.  Therefore, treatment with Tagrisso  was on hold and she was placed on high-dose prednisone  taper.  She completed her prednisone  taper on 05/31/2024. Her cough has improved, except she coughs a little bit at nighttime. Her shortness of breath is good.   Dr. Sherrod recommended having a restaging CT scan of the  chest prior to making decision about permanently holding treatment versus resuming.  The patient denies any major changes in her health except for generalized pain and aching, especially in her knees bilaterally. She reportedly was told ~13 years ago she needed a knee replacement. She is not taking any medications for her pain. She has not taken tylenol .  At her last appointment she was endorsing cramping in her hands despite drinking plenty of water (70 ounces). However she was not drinking electrolyte drinks due to not liking the taste.  She also previously had a rash that has since resolved.  She denies any fever, chills, night sweats, or unexplained weight loss.  She reports a good appetite.  She drinks boost every days.  She denies any hemoptysis or chest pain.  She denies any nausea, vomiting, diarrhea, or constipation.  She is here today to review her restaging CT scan.    MEDICAL HISTORY: Past Medical History:  Diagnosis Date   Aortic stenosis    Arthritis    Breast cancer (HCC)    Chronic back pain    Colon polyps    GERD (gastroesophageal reflux disease)    Hypertension    Malignant neoplasm of right female breast (HCC)    unspecified site of breast   Osteoarthritis    of the knee left worse than right   Prediabetes     ALLERGIES:  is allergic to aspirin, gabapentin, losartan  potassium-hctz, penicillins, and hycodan [hydrocodone  bit-homatrop mbr].  MEDICATIONS:  Current Outpatient Medications  Medication Sig Dispense Refill   doxycycline  (VIBRA -TABS) 100 MG tablet Take 1 tablet (100 mg total) by mouth 2 (two)  times daily. (Patient not taking: Reported on 05/19/2024) 20 tablet 0   HYDROcodone  bit-homatropine (HYCODAN) 5-1.5 MG/5ML syrup Take 5 mLs by mouth every 6 (six) hours as needed for cough. 120 mL 0   lidocaine -prilocaine  (EMLA ) cream Apply 1 Application topically as needed. (Patient not taking: Reported on 05/19/2024) 30 g 2   losartan  (COZAAR ) 100 MG tablet Take 100 mg  by mouth daily.     magnesium gluconate (MAGONATE) 500 MG tablet Take 500 mg by mouth 2 (two) times daily. (Patient not taking: Reported on 05/19/2024)     Multiple Vitamin (MULTIVITAMIN) capsule Take 1 capsule by mouth daily. (Patient not taking: Reported on 05/19/2024)     osimertinib  mesylate (TAGRISSO ) 80 MG tablet Take 1 tablet (80 mg total) by mouth daily. (Patient not taking: Reported on 05/19/2024) 30 tablet 2   predniSONE  (DELTASONE ) 10 MG tablet 5 tablet p.o. daily for 1 week followed by 3 tablets p.o. daily for 1 week followed by 2 tablets p.o. daily 1 week and then 1 tablet p.o. daily for 1 week 77 tablet 0   No current facility-administered medications for this visit.    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   BREAST LUMPECTOMY WITH NEEDLE LOCALIZATION Right 05/11/2013   Procedure: RIGHT BREAST NEEDLE LOCALIZATION  LUMPECTOMY;  Surgeon: Jina Nephew, MD;  Location: [facility];  Service: General;  Laterality: Right;   BREAST LUMPECTOMY WITH RADIOACTIVE SEED LOCALIZATION Right 08/02/2020   Procedure: RIGHT BREAST LUMPECTOMY WITH RADIOACTIVE SEED LOCALIZATION;  Surgeon: Nephew Jina, MD;  Location: [facility];  Service: General;  Laterality: Right;  RNFA   BRONCHIAL BIOPSY  10/07/2023   Procedure: BRONCHIAL BIOPSIES;  Surgeon: Shelah Lamar RAMAN, MD;  Location: [facility] ENDOSCOPY;  Service: Pulmonary;;   BRONCHIAL BRUSHINGS  10/07/2023   Procedure: BRONCHIAL BRUSHINGS;  Surgeon: Shelah Lamar RAMAN, MD;  Location: [facility] ENDOSCOPY;  Service: Pulmonary;;   BRONCHIAL NEEDLE ASPIRATION BIOPSY  10/07/2023   Procedure: BRONCHIAL NEEDLE ASPIRATION BIOPSIES;  Surgeon: Shelah Lamar RAMAN, MD;  Location: [facility] ENDOSCOPY;  Service: Pulmonary;;   CHOLECYSTECTOMY     COLONOSCOPY     FOOT OSTEOTOMY     both  feet   IR CV  LINE INJECTION  02/20/2024   IR IMAGING GUIDED PORT INSERTION  11/07/2023   IR REMOVAL TUN ACCESS W/ PORT W/O FL MOD SED  04/07/2024   TONSILLECTOMY     VIDEO BRONCHOSCOPY WITH ENDOBRONCHIAL ULTRASOUND N/A 10/07/2023   Procedure: VIDEO BRONCHOSCOPY WITH ENDOBRONCHIAL ULTRASOUND;  Surgeon: Shelah Lamar RAMAN, MD;  Location: [facility] ENDOSCOPY;  Service: Pulmonary;  Laterality: N/A;    REVIEW OF SYSTEMS:   Review of Systems  Constitutional: Negative for appetite change, chills, fatigue, fever and unexpected weight change.  HENT:   Negative for mouth sores, nosebleeds, sore throat and trouble swallowing.   Eyes: Negative for eye problems and icterus.  Respiratory: Improved cough but still present mostly at night. Negative for hemoptysis, shortness of breath and wheezing.   Cardiovascular: Negative for chest pain and leg swelling.  Gastrointestinal: Negative for abdominal pain, constipation, diarrhea, nausea and vomiting.  Genitourinary: Negative for bladder incontinence, difficulty urinating, dysuria, frequency and hematuria.   Musculoskeletal: Positive for generalized pain, especially in her knees. Negative for back pain, gait problem, neck pain and neck stiffness.  Skin: Negative for itching  and rash.  Neurological: Negative for dizziness, extremity weakness, gait problem, headaches, light-headedness and seizures.  Hematological: Negative for adenopathy. Does not bruise/bleed easily.  Psychiatric/Behavioral: Negative for confusion, depression and sleep disturbance. The patient is not nervous/anxious.     PHYSICAL EXAMINATION:  There were no vitals taken for this visit.  ECOG PERFORMANCE STATUS: 1  Physical Exam  Constitutional: Oriented to person, place, and time and well-developed, well-nourished, and in no distress.  HENT:  Head: Normocephalic and atraumatic.  Mouth/Throat: Oropharynx is clear and moist. No oropharyngeal exudate.  Eyes: Conjunctivae are normal. Right eye exhibits no discharge.  Left eye exhibits no discharge. No scleral icterus.  Neck: Normal range of motion. Neck supple.  Cardiovascular: Normal rate, regular rhythm, murmur noted and intact distal pulses.   Pulmonary/Chest: Effort normal and breath sounds normal. No respiratory distress. No wheezes. No rales.  Abdominal: Soft. Bowel sounds are normal. Exhibits no distension and no mass. There is no tenderness.  Musculoskeletal: Normal range of motion. Exhibits no edema.  Lymphadenopathy:    No cervical adenopathy.  Neurological: Alert and oriented to person, place, and time. Exhibits normal muscle tone. Gait normal. Coordination normal.  Skin: Skin is warm and dry. No rash noted. Not diaphoretic. No erythema. No pallor.  Psychiatric: Mood, memory and judgment normal.  Vitals reviewed.  LABORATORY DATA: Lab Results  Component Value Date   WBC 6.3 06/02/2024   HGB 12.0 06/02/2024   HCT 37.3 06/02/2024   MCV 76.3 (L) 06/02/2024   PLT 211 06/02/2024      Chemistry      Component Value Date/Time   NA 132 (L) 06/02/2024 1010   NA 139 10/17/2015 1322   K 4.3 06/02/2024 1010   K 4.1 10/17/2015 1322   CL 97 (L) 06/02/2024 1010   CO2 32 06/02/2024 1010   CO2 28 10/17/2015 1322   BUN 19 06/02/2024 1010   BUN 14.5 10/17/2015 1322   CREATININE 0.86 06/02/2024 1010   CREATININE 0.87 04/10/2016 1540   CREATININE 0.9 10/17/2015 1322      Component Value Date/Time   CALCIUM 8.9 06/02/2024 1010   CALCIUM 10.1 10/17/2015 1322   ALKPHOS 59 06/02/2024 1010   ALKPHOS 103 10/17/2015 1322   AST 21 06/02/2024 1010   AST 24 10/17/2015 1322   ALT 22 06/02/2024 1010   ALT 32 10/17/2015 1322   BILITOT 0.6 06/02/2024 1010   BILITOT 0.33 10/17/2015 1322       RADIOGRAPHIC STUDIES:  CT Chest W Contrast Result Date: 06/14/2024 CLINICAL DATA:  Non-small-cell lung cancer restaging * Tracking Code: BO * EXAM: CT CHEST WITH CONTRAST TECHNIQUE: Multidetector CT imaging of the chest was performed during intravenous  contrast administration. RADIATION DOSE REDUCTION: This exam was performed according to the departmental dose-optimization program which includes automated exposure control, adjustment of the mA and/or kV according to patient size and/or use of iterative reconstruction technique. 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 Normal heart size. No pericardial effusion. Mediastinum/Nodes: Unchanged enlarged, treated lymph nodes in the mediastinum, pretracheal node measuring 1.0 x 1.0 cm (series 2, image 51). Thyroid gland, trachea, and esophagus demonstrate no significant findings. 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 New 0.3 cm nodule of the anterior left upper lobe (series 6, image 59). No  pleural effusion or pneumothorax. Upper Abdomen: No acute abnormality. Musculoskeletal: No chest wall abnormality. No acute osseous findings. IMPRESSION: 1. Continued interval organization of bilateral perihilar radiation fibrosis, with diminished ground-glass and increased consolidation. 2. Unchanged enlarged, treated lymph nodes in the mediastinum. 3. New 0.3 cm nodule of the anterior left upper lobe, nonspecific. Attention on follow-up. Electronically Signed   By: Marolyn JONETTA Jaksch M.D.   On: 06/14/2024 17:18     ASSESSMENT/PLAN:  This is a very pleasant 73 year old female followed by the clinic for stage 3B non-small cell lung cancer (adenocarcinoma) in December 2024, presents for the initiation of her first treatment with chemo and radiation. The patient's molecular markers were tested, revealing an uncommon EGFR mutation and a PD-L1 exhalation of 30%, both of which could be utilized for future  treatment.    Completed 6 cycles of concurrent chemoradiation with weekly carboplatin  for an AUC of 2 and paclitaxel  45 mg/m.  The first dose was on 10/29/2023.   She was last seen by Dr. Sherrod on 01/14/2024.  She is positive for EGFR mutation.  Therefore Dr. Sherrod recommended initiation of Tagrisso  80 mg p.o. daily.  He started this on 01/17/2024 and thus far has been tolerating it well except in July she had evidence of possible pneumonitis vs evolving radiation changes.    She completed a high dose prednisone  taper.   The patient was seen with Dr. Sherrod today.  Dr. Sherrod personally and independently reviewed the scan and discussed results with the patient today.  The scan showed no evidence of progression. The ground glass and consolidation has diminished.  Dr. Sherrod recommends that she continue on observation and discontinuing her Tagrisso  for now due to questionable pneumonitis. If she ever has progression in the future, we can revisit treatment options at that time.   We will see her back for labs and follow up in 3 months. We will arrange for a restaging CT scan about 1 week before.   Knee osteoarthritis Knee osteoarthritis with previous knee surgery approximately 13-15 years ago. Recent onset of knee pain in the last 1-2 weeks. No current interest in surgical intervention. - She can consider referral to an orthopedic specialist for evaluation and potential conservative management options such as joint injections. Otherwise, she can certainly use tylenol  at this time and OTC topical joint rubs.   Dry cough Mild dry cough, more pronounced at night. No current treatment being used. - Recommend over-the-counter dextromethorphan if cough suppression is needed for sleep.   The patient was advised to call immediately if she has any concerning symptoms in the interval. The patient voices understanding of current disease status and treatment options and is in agreement with the current  care plan. All questions were answered. The patient knows to call the clinic with any problems, questions or concerns. We can certainly see the patient much sooner if necessary   No orders of the defined types were placed in this encounter.     Bronnie Vasseur L Chey Rachels, PA-C 06/19/24  ADDENDUM: Hematology/Oncology Attending: I had a face-to-face encounter with the patient today.  I reviewed her records, lab, scan and recommended her care plan.  This is a very pleasant 73 years old female with a stage IIIb non-small cell lung cancer, adenocarcinoma diagnosed in December 2024 with positive EGFR mutation G719C, E709V, amplification.  She also has PD-L1 expression of 30%.  She underwent a course of concurrent chemoradiation with weekly carboplatin  and paclitaxel .  This was followed by consolidation  treatment with Tagrisso  80 mg p.o. daily status post 4 months of treatment discontinued secondary to highly suspicious drug-induced pneumonitis. The patient was treated with a tapering dose of prednisone .  She had repeat CT scan of the chest performed recently.  I personally and independently reviewed the scan images and discussed the result and showed the images to the patient and her husband. HIDA scan showed continued interval organization of bilateral bili however radiation fibrosis with diminished groundglass and increased consolidation.  There was unchanged enlarged and treated lymph nodes in the mediastinum.  There was new 0.3 cm nodule of the anterior left upper lobe that nonspecific and require attention on follow-up exam. I had a lengthy discussion with the patient and her husband about her condition and treatment options.  I recommended for the patient to continue on observation for now with close monitoring.  I I did not recommend for the patient to resume her treatment with Tagrisso  at this point because of the previous drug-induced pneumonitis and concern about recurrence of her pneumonitis if  this treatment is resumed. We will continue to monitor her closely and if there is any concerning findings for disease progression, I will discuss with her other treatment options. The patient and her husband are in agreement with the current plan. She will come back for follow-up visit in 3 months for reevaluation and repeat CT scan of the chest for restaging of her disease. She was advised to call immediately if she has any other concerning symptoms in the interval. The total time spent in the appointment was 30 minutes including review of chart and various tests results, discussions about plan of care and coordination of care plan . Disclaimer: This note was dictated with voice recognition software. Similar sounding words can inadvertently be transcribed and may be missed upon review. Sherrod MARLA Sherrod, MD

## 2024-06-23 ENCOUNTER — Other Ambulatory Visit: Payer: Self-pay

## 2024-06-23 ENCOUNTER — Inpatient Hospital Stay

## 2024-06-23 ENCOUNTER — Inpatient Hospital Stay (HOSPITAL_BASED_OUTPATIENT_CLINIC_OR_DEPARTMENT_OTHER): Admitting: Physician Assistant

## 2024-06-23 ENCOUNTER — Other Ambulatory Visit: Payer: Self-pay | Admitting: Pharmacy Technician

## 2024-06-23 VITALS — BP 152/79 | HR 92 | Temp 97.7°F | Resp 16 | Wt 130.9 lb

## 2024-06-23 DIAGNOSIS — C342 Malignant neoplasm of middle lobe, bronchus or lung: Secondary | ICD-10-CM

## 2024-06-23 LAB — CBC WITH DIFFERENTIAL (CANCER CENTER ONLY)
Abs Immature Granulocytes: 0.01 K/uL (ref 0.00–0.07)
Basophils Absolute: 0 K/uL (ref 0.0–0.1)
Basophils Relative: 1 %
Eosinophils Absolute: 0.1 K/uL (ref 0.0–0.5)
Eosinophils Relative: 2 %
HCT: 35.5 % — ABNORMAL LOW (ref 36.0–46.0)
Hemoglobin: 11.4 g/dL — ABNORMAL LOW (ref 12.0–15.0)
Immature Granulocytes: 0 %
Lymphocytes Relative: 26 %
Lymphs Abs: 1.7 K/uL (ref 0.7–4.0)
MCH: 24.8 pg — ABNORMAL LOW (ref 26.0–34.0)
MCHC: 32.1 g/dL (ref 30.0–36.0)
MCV: 77.2 fL — ABNORMAL LOW (ref 80.0–100.0)
Monocytes Absolute: 0.6 K/uL (ref 0.1–1.0)
Monocytes Relative: 10 %
Neutro Abs: 4 K/uL (ref 1.7–7.7)
Neutrophils Relative %: 61 %
Platelet Count: 225 K/uL (ref 150–400)
RBC: 4.6 MIL/uL (ref 3.87–5.11)
RDW: 18.5 % — ABNORMAL HIGH (ref 11.5–15.5)
WBC Count: 6.5 K/uL (ref 4.0–10.5)
nRBC: 0 % (ref 0.0–0.2)

## 2024-06-23 LAB — CMP (CANCER CENTER ONLY)
ALT: 17 U/L (ref 0–44)
AST: 21 U/L (ref 15–41)
Albumin: 3.7 g/dL (ref 3.5–5.0)
Alkaline Phosphatase: 62 U/L (ref 38–126)
Anion gap: 4 — ABNORMAL LOW (ref 5–15)
BUN: 15 mg/dL (ref 8–23)
CO2: 30 mmol/L (ref 22–32)
Calcium: 9.1 mg/dL (ref 8.9–10.3)
Chloride: 102 mmol/L (ref 98–111)
Creatinine: 0.88 mg/dL (ref 0.44–1.00)
GFR, Estimated: >60 mL/min (ref >60)
Glucose, Bld: 148 mg/dL — ABNORMAL HIGH (ref 70–99)
Potassium: 4.5 mmol/L (ref 3.5–5.1)
Sodium: 136 mmol/L (ref 135–145)
Total Bilirubin: 0.5 mg/dL (ref 0.0–1.2)
Total Protein: 6.2 g/dL — ABNORMAL LOW (ref 6.5–8.1)

## 2024-06-23 NOTE — Progress Notes (Addendum)
 Dis enrolling patient from program on 8/26 for Tagrisso .  Per Chart: 8.26 MD is discontinuing patient from therapy due to questionable pneumonitis.  Dis enrollment approved by Rph Beth.

## 2024-06-24 ENCOUNTER — Encounter: Payer: Self-pay | Admitting: Internal Medicine

## 2024-06-24 ENCOUNTER — Other Ambulatory Visit: Payer: Self-pay

## 2024-06-25 ENCOUNTER — Telehealth: Payer: Self-pay | Admitting: Medical Oncology

## 2024-06-25 ENCOUNTER — Telehealth: Payer: Self-pay | Admitting: Physician Assistant

## 2024-06-25 NOTE — Telephone Encounter (Signed)
 Spoke with the patient's husband today after being informed that he had some questions regarding her care. He inquired whether her lung cancer stage had changed following treatment. I explained that her cancer remains stage III, as it was at the time of diagnosis. Cancer staging does not decrease after treatment, though it may be upstaged if there is evidence of progression.  He also had several questions about her recent imaging, which I addressed during our conversation. He stated that all his questions were answered and did not express any additional concerns at this time.  The patient is scheduled for repeat chest imaging in 3 months, with follow-up in clinic to review results.

## 2024-06-25 NOTE — Telephone Encounter (Signed)
 Husband LVM that he wants to speak to [facility] regarding pt recent visit. Messaged Cassie.

## 2024-07-02 DIAGNOSIS — J701 Chronic and other pulmonary manifestations due to radiation: Secondary | ICD-10-CM | POA: Diagnosis not present

## 2024-07-02 DIAGNOSIS — C3490 Malignant neoplasm of unspecified part of unspecified bronchus or lung: Secondary | ICD-10-CM | POA: Diagnosis not present

## 2024-07-12 ENCOUNTER — Other Ambulatory Visit: Payer: Self-pay

## 2024-07-15 ENCOUNTER — Encounter: Payer: Self-pay | Admitting: Internal Medicine

## 2024-07-15 ENCOUNTER — Ambulatory Visit
Admission: RE | Admit: 2024-07-15 | Discharge: 2024-07-15 | Disposition: A | Discharge status: Home / Self Care | Source: Ambulatory Visit | Attending: Internal Medicine | Admitting: Internal Medicine

## 2024-07-15 DIAGNOSIS — Z1231 Encounter for screening mammogram for malignant neoplasm of breast: Secondary | ICD-10-CM

## 2024-08-03 ENCOUNTER — Telehealth: Payer: Self-pay

## 2024-08-03 NOTE — Telephone Encounter (Signed)
 Faxed completed dental clearance form to dental office with confirmation at [phone].

## 2024-08-04 ENCOUNTER — Telehealth: Payer: Self-pay | Admitting: Medical Oncology

## 2024-08-04 NOTE — Telephone Encounter (Signed)
 Refaxed dental clearance form to [facility].

## 2024-08-26 DIAGNOSIS — H2513 Age-related nuclear cataract, bilateral: Secondary | ICD-10-CM | POA: Diagnosis not present

## 2024-08-26 DIAGNOSIS — H40033 Anatomical narrow angle, bilateral: Secondary | ICD-10-CM | POA: Diagnosis not present

## 2024-09-17 ENCOUNTER — Ambulatory Visit (HOSPITAL_COMMUNITY)
Admission: RE | Admit: 2024-09-17 | Discharge: 2024-09-17 | Disposition: A | Discharge status: Home / Self Care | Source: Ambulatory Visit | Attending: Physician Assistant | Admitting: Physician Assistant

## 2024-09-17 DIAGNOSIS — C342 Malignant neoplasm of middle lobe, bronchus or lung: Secondary | ICD-10-CM | POA: Diagnosis not present

## 2024-09-17 DIAGNOSIS — C3490 Malignant neoplasm of unspecified part of unspecified bronchus or lung: Secondary | ICD-10-CM | POA: Diagnosis not present

## 2024-09-17 MED ORDER — IOHEXOL 300 MG/ML  SOLN
75.0000 mL | Freq: Once | INTRAMUSCULAR | Status: AC | PRN
  Administered 2024-09-17: 75 mL via INTRAVENOUS

## 2024-09-17 MED ORDER — SODIUM CHLORIDE (PF) 0.9 % IJ SOLN
INTRAMUSCULAR | Status: AC
  Filled 2024-09-17: qty 50

## 2024-09-21 DIAGNOSIS — M519 Unspecified thoracic, thoracolumbar and lumbosacral intervertebral disc disorder: Secondary | ICD-10-CM | POA: Diagnosis not present

## 2024-09-21 DIAGNOSIS — I10 Essential (primary) hypertension: Secondary | ICD-10-CM | POA: Diagnosis not present

## 2024-09-21 DIAGNOSIS — M5416 Radiculopathy, lumbar region: Secondary | ICD-10-CM | POA: Diagnosis not present

## 2024-09-29 ENCOUNTER — Inpatient Hospital Stay: Attending: Internal Medicine

## 2024-09-29 DIAGNOSIS — Z85118 Personal history of other malignant neoplasm of bronchus and lung: Secondary | ICD-10-CM | POA: Insufficient documentation

## 2024-09-29 DIAGNOSIS — Z9221 Personal history of antineoplastic chemotherapy: Secondary | ICD-10-CM | POA: Diagnosis not present

## 2024-09-29 DIAGNOSIS — C342 Malignant neoplasm of middle lobe, bronchus or lung: Secondary | ICD-10-CM

## 2024-09-29 DIAGNOSIS — D649 Anemia, unspecified: Secondary | ICD-10-CM | POA: Insufficient documentation

## 2024-09-29 LAB — CMP (CANCER CENTER ONLY)
ALT: 19 U/L (ref 0–44)
AST: 26 U/L (ref 15–41)
Albumin: 4.1 g/dL (ref 3.5–5.0)
Alkaline Phosphatase: 79 U/L (ref 38–126)
Anion gap: 8 (ref 5–15)
BUN: 19 mg/dL (ref 8–23)
CO2: 29 mmol/L (ref 22–32)
Calcium: 9 mg/dL (ref 8.9–10.3)
Chloride: 98 mmol/L (ref 98–111)
Creatinine: 0.8 mg/dL (ref 0.44–1.00)
GFR, Estimated: >60 mL/min (ref >60)
Glucose, Bld: 116 mg/dL — ABNORMAL HIGH (ref 70–99)
Potassium: 3.8 mmol/L (ref 3.5–5.1)
Sodium: 135 mmol/L (ref 135–145)
Total Bilirubin: 0.4 mg/dL (ref 0.0–1.2)
Total Protein: 6.7 g/dL (ref 6.5–8.1)

## 2024-09-29 LAB — CBC WITH DIFFERENTIAL (CANCER CENTER ONLY)
Abs Immature Granulocytes: 0.01 K/uL (ref 0.00–0.07)
Basophils Absolute: 0 K/uL (ref 0.0–0.1)
Basophils Relative: 0 %
Eosinophils Absolute: 0.1 K/uL (ref 0.0–0.5)
Eosinophils Relative: 2 %
HCT: 35.9 % — ABNORMAL LOW (ref 36.0–46.0)
Hemoglobin: 12 g/dL (ref 12.0–15.0)
Immature Granulocytes: 0 %
Lymphocytes Relative: 28 %
Lymphs Abs: 1.8 K/uL (ref 0.7–4.0)
MCH: 25.5 pg — ABNORMAL LOW (ref 26.0–34.0)
MCHC: 33.4 g/dL (ref 30.0–36.0)
MCV: 76.2 fL — ABNORMAL LOW (ref 80.0–100.0)
Monocytes Absolute: 0.6 K/uL (ref 0.1–1.0)
Monocytes Relative: 9 %
Neutro Abs: 3.7 K/uL (ref 1.7–7.7)
Neutrophils Relative %: 61 %
Platelet Count: 189 K/uL (ref 150–400)
RBC: 4.71 MIL/uL (ref 3.87–5.11)
RDW: 14.6 % (ref 11.5–15.5)
WBC Count: 6.2 K/uL (ref 4.0–10.5)
nRBC: 0 % (ref 0.0–0.2)

## 2024-09-30 DIAGNOSIS — M5432 Sciatica, left side: Secondary | ICD-10-CM | POA: Diagnosis not present

## 2024-09-30 DIAGNOSIS — M545 Low back pain, unspecified: Secondary | ICD-10-CM | POA: Diagnosis not present

## 2024-09-30 DIAGNOSIS — G8929 Other chronic pain: Secondary | ICD-10-CM | POA: Diagnosis not present

## 2024-09-30 DIAGNOSIS — M5431 Sciatica, right side: Secondary | ICD-10-CM | POA: Diagnosis not present

## 2024-09-30 DIAGNOSIS — M5416 Radiculopathy, lumbar region: Secondary | ICD-10-CM | POA: Diagnosis not present

## 2024-10-06 ENCOUNTER — Inpatient Hospital Stay: Admitting: Internal Medicine

## 2024-10-06 ENCOUNTER — Encounter: Payer: Self-pay | Admitting: *Deleted

## 2024-10-06 VITALS — BP 140/64 | HR 82 | Temp 97.6°F | Resp 17 | Ht 60.0 in | Wt 135.8 lb

## 2024-10-06 DIAGNOSIS — Z85118 Personal history of other malignant neoplasm of bronchus and lung: Secondary | ICD-10-CM | POA: Diagnosis not present

## 2024-10-06 DIAGNOSIS — C3490 Malignant neoplasm of unspecified part of unspecified bronchus or lung: Secondary | ICD-10-CM

## 2024-10-06 NOTE — Progress Notes (Signed)
 [facility] [phone]   [phone]  OFFICE PROGRESS NOTE  Ransom Other, MD [address] [place] [address]  DIAGNOSIS:  stage IIIb (T1c, N3, M0) non-small cell lung cancer, adenocarcinoma presented with right middle lobe lung nodule in addition to bilateral hilar and mediastinal lymphadenopathy diagnosed in December 2024.   Biomarker Findings HRD signature - HRDsig Negative Microsatellite status - MS-Stable Tumor Mutational Burden - 2 Muts/Mb Genomic Findings For a complete list of the genes assayed, please refer to the Appendix. EGFR G719C, E709V, amplification CDKN2A loss - equivocal? MTAP loss - equivocal? CDKN2B loss - equivocal? FGF23 R187W - subclonal? TP53 R248L 7 Disease relevant genes with no reportable alterations: ALK, BRAF, ERBB2, KRAS, MET, RET, ROS1  PDL1 Expression 30%   PRIOR THERAPY:  1) Concurrent chemoradiation with weekly carboplatin  for AUC of 2 and paclitaxel  45 Mg/M2.  First dose October 29, 2023.  Status post 6 cycles. 2) Consolidation treatment with Tagrisso  80 mg p.o. daily.  First dose started January 18, 2024.  Her treatment was discontinued in July 2025 secondary to suspicious drug-induced pneumonitis.  CURRENT THERAPY: Observation.  INTERVAL HISTORY: Ashley Pratt 73 y.o. female returns to the clinic today for follow-up visit accompanied by her husband. Discussed the use of AI scribe software for clinical note transcription with the patient, who gave verbal consent to proceed.  History of Present Illness Ashley Pratt is a 73 year old female with stage three B non-small cell lung cancer who presents for evaluation with repeat CT scan of the chest for restaging of her disease.  She was diagnosed with stage three B non-small cell lung cancer, adenocarcinoma, in December 2024, with positive EGFR mutations G719C and E709V. She is currently under observation and is here  for a repeat CT scan to restage her disease.  No new complaints since her last visit a few months ago. No chest pain and breathing is very good. Energy levels are improving, and she is taking multivitamins, including iron supplements, which have helped improve her hemoglobin levels.  She mentions a persistent issue with swelling and difficulty swallowing, which she attributes to scarring from previous radiation therapy. This has impacted her ability to eat comfortably.    MEDICAL HISTORY: Past Medical History:  Diagnosis Date   Aortic stenosis    Arthritis    Breast cancer (HCC)    Chronic back pain    Colon polyps    GERD (gastroesophageal reflux disease)    Hypertension    Malignant neoplasm of right female breast (HCC)    unspecified site of breast   Osteoarthritis    of the knee left worse than right   Prediabetes     ALLERGIES:  is allergic to aspirin, gabapentin, losartan  potassium-hctz, penicillins, and hycodan [hydrocodone  bit-homatrop mbr].  MEDICATIONS:  Current Outpatient Medications  Medication Sig Dispense Refill   doxycycline  (VIBRA -TABS) 100 MG tablet Take 1 tablet (100 mg total) by mouth 2 (two) times daily. 20 tablet 0   HYDROcodone  bit-homatropine (HYCODAN) 5-1.5 MG/5ML syrup Take 5 mLs by mouth every 6 (six) hours as needed for cough. 120 mL 0   lidocaine -prilocaine  (EMLA ) cream Apply 1 Application topically as needed. 30 g 2   losartan  (COZAAR ) 100 MG tablet Take 100 mg by mouth daily.     magnesium gluconate (MAGONATE) 500 MG tablet Take 500 mg by mouth 2 (two) times daily.     Multiple Vitamin (MULTIVITAMIN) capsule Take 1  capsule by mouth daily.     omeprazole (PRILOSEC) 40 MG capsule Take 40 mg by mouth every morning.     osimertinib  mesylate (TAGRISSO ) 80 MG tablet Take 1 tablet (80 mg total) by mouth daily. 30 tablet 2   predniSONE  (DELTASONE ) 10 MG tablet 5 tablet p.o. daily for 1 week followed by 3 tablets p.o. daily for 1 week followed by 2 tablets  p.o. daily 1 week and then 1 tablet p.o. daily for 1 week 77 tablet 0   No current facility-administered medications for this visit.    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   BREAST LUMPECTOMY WITH NEEDLE LOCALIZATION Right 05/11/2013   Procedure: RIGHT BREAST NEEDLE LOCALIZATION  LUMPECTOMY;  Surgeon: Jina Nephew, MD;  Location: [facility];  Service: General;  Laterality: Right;   BREAST LUMPECTOMY WITH RADIOACTIVE SEED LOCALIZATION Right 08/02/2020   Procedure: RIGHT BREAST LUMPECTOMY WITH RADIOACTIVE SEED LOCALIZATION;  Surgeon: Nephew Jina, MD;  Location:  SURGERY CENTER;  Service: General;  Laterality: Right;  RNFA   BRONCHIAL BIOPSY  10/07/2023   Procedure: BRONCHIAL BIOPSIES;  Surgeon: Shelah Lamar RAMAN, MD;  Location: [facility] END[facility]COPY;  Service: Pulmonary;;   BRONCHIAL BRUSHINGS  10/07/2023   Procedure: BRONCHIAL BRUSHINGS;  Surgeon: Shelah Lamar RAMAN, MD;  Location: [facility] END[facility]COPY;  Service: Pulmonary;;   BRONCHIAL NEEDLE ASPIRATION BIOPSY  10/07/2023   Procedure: BRONCHIAL NEEDLE ASPIRATION BIOPSIES;  Surgeon: Shelah Lamar RAMAN, MD;  Location: [facility] ENDOSCOPY;  Service: Pulmonary;;   CHOLECYSTECTOMY     COLONOSCOPY     FOOT OSTEOTOMY     both  feet   IR CV LINE INJECTION  02/20/2024   IR IMAGING GUIDED PORT INSERTION  11/07/2023   IR REMOVAL TUN ACCESS W/ PORT W/O FL MOD SED  04/07/2024   TONSILLECTOMY     VIDEO BRONCHOSCOPY WITH ENDOBRONCHIAL ULTRASOUND N/A 10/07/2023   Procedure: VIDEO BRONCHOSCOPY WITH ENDOBRONCHIAL ULTRASOUND;  Surgeon: Shelah Lamar RAMAN, MD;  Location: [facility] ENDOSCOPY;  Service: Pulmonary;  Laterality: N/A;    REVIEW OF SYSTEMS:  Constitutional: positive for fatigue Eyes: negative Ears, nose, mouth, throat, and face: negative Respiratory: positive for cough Cardiovascular:  negative Gastrointestinal: negative Genitourinary:negative Integument/breast: negative Hematologic/lymphatic: negative Musculoskeletal:negative Neurological: negative Behavioral/Psych: negative Endocrine: negative Allergic/Immunologic: negative   PHYSICAL EXAMINATION: General appearance: alert, cooperative, fatigued, and no distress Head: Normocephalic, without obvious abnormality, atraumatic Neck: no adenopathy, no JVD, supple, symmetrical, trachea midline, and thyroid  not enlarged, symmetric, no tenderness/mass/nodules Lymph nodes: Cervical, supraclavicular, and axillary nodes normal. Resp: clear to auscultation bilaterally Back: symmetric, no curvature. ROM normal. No CVA tenderness. Cardio: regular rate and rhythm, S1, S2 normal, no murmur, click, rub or gallop GI: soft, non-tender; bowel sounds normal; no masses,  no organomegaly Extremities: extremities normal, atraumatic, no cyanosis or edema Neurologic: Alert and oriented X 3, normal strength and tone. Normal symmetric reflexes. Normal coordination and gait  ECOG PERFORMANCE STATUS: 1 - Symptomatic but completely ambulatory  Blood pressure (!) 143/76, pulse 82, temperature 97.6 F (36.4 C), temperature source Temporal, resp. rate 17, height 5' (1.524 m), weight 135 lb 12.8 oz (61.6 kg), SpO2 100%.  LABORATORY DATA: Lab Results  Component Value Date   WBC 6.2 09/29/2024   HGB 12.0 09/29/2024   HCT 35.9 (L) 09/29/2024   MCV 76.2 (L) 09/29/2024   PLT 189  09/29/2024      Chemistry      Component Value Date/Time   NA 135 09/29/2024 0956   NA 139 10/17/2015 1322   K 3.8 09/29/2024 0956   K 4.1 10/17/2015 1322   CL 98 09/29/2024 0956   CO2 29 09/29/2024 0956   CO2 28 10/17/2015 1322   BUN 19 09/29/2024 0956   BUN 14.5 10/17/2015 1322   CREATININE 0.80 09/29/2024 0956   CREATININE 0.87 04/10/2016 1540   CREATININE 0.9 10/17/2015 1322      Component Value Date/Time   CALCIUM 9.0 09/29/2024 0956   CALCIUM 10.1  10/17/2015 1322   ALKPHOS 79 09/29/2024 0956   ALKPHOS 103 10/17/2015 1322   AST 26 09/29/2024 0956   AST 24 10/17/2015 1322   ALT 19 09/29/2024 0956   ALT 32 10/17/2015 1322   BILITOT 0.4 09/29/2024 0956   BILITOT 0.33 10/17/2015 1322       RADIOGRAPHIC STUDIES: CT Chest W Contrast Result Date: 09/22/2024 EXAM: CT CHEST WITH CONTRAST 09/17/2024 10:53:57 AM TECHNIQUE: CT of the chest was performed with the administration of 75 mL of iohexol  (OMNIPAQUE ) 300 MG/ML solution. Multiplanar reformatted images are provided for review. Automated exposure control, iterative reconstruction, and/or weight based adjustment of the mA/kV was utilized to reduce the radiation dose to as low as reasonably achievable. COMPARISON: Multiple chest CT, most recent 06/11/2024. CLINICAL HISTORY: Non-small cell lung cancer (NSCLC), non-metastatic, assess treatment response. * Tracking Code: BO * FINDINGS: MEDIASTINUM: 1.0 cm right paratracheal node on image 21 / 2 is unchanged. No hilar or supraclavicular adenopathy. The esophagus is moderately dilated with fluid level within including on image 17 / 2. Cardiovascular: Mild to moderate cardiomegaly, without pericardial effusion.Left sided SVC again identified. 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 Aortic atherosclerosis. Normal aortic caliber. LUNGS AND PLEURA: Minimal mucoid impaction within the inferior right middle lobe is similar on image 89 / 6. The 3 mm anterior left upper lobe pulmonary nodule is no longer identified. 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 No local recurrence at either site. No central pulmonary embolism on this non-dedicated exam. No pneumothorax or pleural fluid. SOFT TISSUES/BONES: Surgical clips in the right breast. No acute abnormality of the bones. UPPER ABDOMEN: Limited images of the upper abdomen demonstrate cholecystectomy and normally imaged adrenal glands. No acute  abnormality is seen. IMPRESSION: 1. Similar appearance of right greater than left perihilar radiation fibrosis, without residual or recurrent disease. 2. The left upper lobe nodule of interest on the prior exam has resolved. 3. Similar borderline mediastinal adenopathy. 4. Esophageal fluid level suggests dysmotility and/or gastroesophageal reflux. Electronically signed by: Rockey Kilts MD 09/22/2024 11:08 AM EST RP Workstation: [id number]    ASSESSMENT AND PLAN: This is a very pleasant 73 years old female with stage IIIb (T1c, N3, M0) non-small cell lung cancer, adenocarcinoma presented with right middle lobe lung nodule in addition to bilateral hilar and mediastinal lymphadenopathy diagnosed in December 2024.  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 She is currently on concurrent chemoradiation with weekly carboplatin  for AUC of 2 and paclitaxel  45 Mg/M2.  First dose October 29, 2023.  She is status post 6 cycles.  She has been tolerating the treatment well except for the fatigue as well as radiation-induced odynophagia and dysphagia with poor p.o. intake.  She is recovering well from the previous course of concurrent chemoradiation.   She is currently on  treatment with Tagrisso  80 mg p.o. daily started January 18, 2024.  She tolerated her treatment well for the suspicious drug-induced pneumonitis and it was discontinued.  She is currently on observation. She had repeat CT scan of the chest performed recently.  I personally independently reviewed the scan and discussed the result with the patient and her husband.  Her scan showed no concerning findings for disease progression and there was resolution of the left upper lobe nodule. Assessment and Plan Assessment & Plan Stage 3B non-small cell lung adenocarcinoma with EGFR mutation Stage 3B non-small cell lung adenocarcinoma with EGFR mutation (G719C and E709V) diagnosed in  December 2024. Currently on observation. Recent CT scan shows no evidence of disease progression or metastasis. Previous density resolved. Overall condition stable with no new symptoms reported. - Continue observation. - Will schedule repeat CT scan in four months for restaging. - If stable, will extend follow-up interval to six months.  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 Reports persistent swelling and difficulty swallowing, likely due to esophageal stricture from previous radiation therapy. - Will refer to gastroenterologist for evaluation and possible esophageal dilation if swallowing difficulties persist.  Anemia Improving hemoglobin levels. Currently taking multivitamins, which may be contributing to improvement. - Continue multivitamin supplementation. The patient was advised to call immediately if she has any other concerning symptoms in the interval.  The patient voices understanding of current disease status and treatment options and is in agreement with the current care plan.  All questions were answered. The patient knows to call the clinic with any problems, questions or concerns. We can certainly see the patient much sooner if necessary.  The total time spent in the appointment was 30 minutes including review of chart and various tests results, discussions about plan of care and coordination of care plan .   Disclaimer: This note was dictated with voice recognition software. Similar sounding words can inadvertently be transcribed and may not be corrected upon review.

## 2024-10-07 ENCOUNTER — Other Ambulatory Visit: Payer: Self-pay

## 2024-10-07 ENCOUNTER — Ambulatory Visit: Attending: Cardiology | Admitting: Cardiology

## 2024-10-07 ENCOUNTER — Encounter: Payer: Self-pay | Admitting: Cardiology

## 2024-10-07 VITALS — BP 142/80 | HR 78 | Ht 59.0 in | Wt 135.6 lb

## 2024-10-07 DIAGNOSIS — I10 Essential (primary) hypertension: Secondary | ICD-10-CM | POA: Diagnosis not present

## 2024-10-07 DIAGNOSIS — Q269 Congenital malformation of great vein, unspecified: Secondary | ICD-10-CM | POA: Diagnosis not present

## 2024-10-07 DIAGNOSIS — I350 Nonrheumatic aortic (valve) stenosis: Secondary | ICD-10-CM

## 2024-10-07 NOTE — Progress Notes (Signed)
 Cardiology Office Note:    Date:  10/08/2024   ID:  Ashley Pratt, DOB [birth year], MRN [id number]  PCP:  Ransom Other, MD  Cardiologist:  Sanyiah Kanzler, DO  Electrophysiologist:  None   Referring MD: Ransom Other, MD    I am doing well   History of Present Illness:    Ashley Pratt is a 73 y.o. female with a hx of mild aortic stenosis, history of breast cancer, GERD, hypertension, osteoarthritis here today for follow-up visit.  She is here today with her husband. She offers no complaints. She shares that she did she her oncologist recently and received great report.   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   BREAST LUMPECTOMY WITH NEEDLE LOCALIZATION Right 05/11/2013   Procedure: RIGHT BREAST NEEDLE LOCALIZATION  LUMPECTOMY;  Surgeon: Jina Nephew, MD;  Location: [facility];  Service: General;  Laterality: Right;   BREAST LUMPECTOMY WITH RADIOACTIVE SEED LOCALIZATION Right 08/02/2020   Procedure: RIGHT BREAST LUMPECTOMY WITH RADIOACTIVE SEED LOCALIZATION;  Surgeon: Nephew Jina, MD;  Location: [facility];  Service: General;  Laterality: Right;  RNFA   BRONCHIAL BIOPSY  10/07/2023   Procedure: BRONCHIAL BIOPSIES;  Surgeon: Shelah Lamar RAMAN, MD;  Location: [facility];  Service: Pulmonary;;   BRONCHIAL BRUSHINGS  10/07/2023   Procedure: BRONCHIAL BRUSHINGS;  Surgeon: Shelah Lamar RAMAN, MD;  Location: [facility];  Service: Pulmonary;;    BRONCHIAL NEEDLE ASPIRATION BIOPSY  10/07/2023   Procedure: BRONCHIAL NEEDLE ASPIRATION BIOPSIES;  Surgeon: Shelah Lamar RAMAN, MD;  Location: [facility];  Service: Pulmonary;;   CHOLECYSTECTOMY     COLONOSCOPY     FOOT OSTEOTOMY     both  feet   IR CV LINE INJECTION  02/20/2024   IR IMAGING GUIDED PORT INSERTION  11/07/2023   IR REMOVAL TUN ACCESS W/ PORT W/O FL MOD SED  04/07/2024   TONSILLECTOMY     VIDEO BRONCHOSCOPY WITH ENDOBRONCHIAL ULTRASOUND N/A 10/07/2023   Procedure: VIDEO BRONCHOSCOPY WITH ENDOBRONCHIAL ULTRASOUND;  Surgeon: Shelah Lamar RAMAN, MD;  Location: [facility];  Service: Pulmonary;  Laterality: N/A;    Current Medications: Current Meds  Medication Sig   losartan  (COZAAR ) 100 MG tablet Take 100 mg by mouth daily.   magnesium gluconate (MAGONATE) 500 MG tablet Take 500 mg by mouth 2 (two) times daily.   Multiple Vitamin (MULTIVITAMIN) capsule Take 1 capsule by mouth daily.   omeprazole (PRILOSEC) 40 MG capsule Take 40 mg by mouth every morning.   predniSONE  (DELTASONE ) 10 MG tablet 5 tablet p.o. daily for 1 week followed by 3 tablets p.o. daily for 1 week followed by 2 tablets p.o. daily 1 week and then 1 tablet p.o. daily for 1 week     Allergies:   Aspirin, Gabapentin, Losartan  potassium-hctz, Penicillins, and Hycodan [hydrocodone  bit-homatrop mbr]   Social History   Socioeconomic History  Marital status: Married    Spouse name: Not on file   Number of children: 2   Years of education: Not on file   Highest education level: Not on file  Occupational History   Not on file  Tobacco Use   Smoking status: Never   Smokeless tobacco: Never  Substance and Sexual Activity   Alcohol use: No   Drug use: No   Sexual activity: Yes  Other Topics Concern   Not on file  Social History Narrative   Lives with husband.     Social Drivers of Health   Tobacco Use: Low Risk (10/07/2024)   Patient History    Smoking Tobacco Use: Never    Smokeless Tobacco Use: Never     Passive Exposure: Not on file  Financial Resource Strain: Not on file  Food Insecurity: Low Risk (09/23/2023)   Received from [facility]   Epic    Within the past 12 months, you worried that your food would run out before you got money to buy more: Never true    Within the past 12 months, the food you bought just didn't last and you didn't have money to get more. : Never true  Transportation Needs: No Transportation Needs (09/23/2023)   Received from [facility]    In the past 12 months, has lack of reliable transportation kept you from medical appointments, meetings, work or from getting things needed for daily living? : No  Physical Activity: Not on file  Stress: Not on file  Social Connections: Not on file  Depression (PHQ2-9): Low Risk (06/23/2024)   Depression (PHQ2-9)    PHQ-2 Score: 0  Alcohol Screen: Not on file  Housing: Low Risk (09/23/2023)   Received from [facility]   Epic    What is your living situation today?: I have a steady place to live    Think about the place you live. Do you have problems with any of the following? Choose all that apply:: None/None on this list  Utilities: Low Risk (09/23/2023)   Received from [facility]   Utilities    In the past 12 months has the electric, gas, oil, or water company threatened to shut off services in your home? : No  Health Literacy: Not on file     Family History: The patient's family history includes Hypertension in her brother, mother, and sister; Stroke in her mother.  ROS:   Review of Systems  Constitution: Negative for decreased appetite, fever and weight gain.  HENT: Negative for congestion, ear discharge, hoarse voice and sore throat.   Eyes: Negative for discharge, redness, vision loss in right eye and visual halos.  Cardiovascular: Negative for chest pain, dyspnea on exertion, leg swelling, orthopnea and palpitations.  Respiratory: Negative for cough, hemoptysis, shortness of breath  and snoring.   Endocrine: Negative for heat intolerance and polyphagia.  Hematologic/Lymphatic: Negative for bleeding problem. Does not bruise/bleed easily.  Skin: Negative for flushing, nail changes, rash and suspicious lesions.  Musculoskeletal: Negative for arthritis, joint pain, muscle cramps, myalgias, neck pain and stiffness.  Gastrointestinal: Negative for abdominal pain, bowel incontinence, diarrhea and excessive appetite.  Genitourinary: Negative for decreased libido, genital sores and incomplete emptying.  Neurological: Negative for brief paralysis, focal weakness, headaches and loss of balance.  Psychiatric/Behavioral: Negative for altered mental status, depression and suicidal ideas.  Allergic/Immunologic: Negative for HIV exposure and persistent infections.    EKGs/Labs/Other Studies Reviewed:    The following studies were reviewed  today:   EKG:  The ekg ordered today demonstrates   Recent Labs: 12/05/2023: Magnesium 1.8 09/29/2024: ALT 19; BUN 19; Creatinine 0.80; Hemoglobin 12.0; Platelet Count 189; Potassium 3.8; Sodium 135  Recent Lipid Panel No results found for: CHOL, TRIG, HDL, CHOLHDL, VLDL, LDLCALC, LDLDIRECT  Physical Exam:    VS:  BP (!) 142/80   Pulse 78   Ht 4' 11 (1.499 m)   Wt 135 lb 9.6 oz (61.5 kg)   SpO2 98%   BMI 27.39 kg/m     Wt Readings from Last 3 Encounters:  10/07/24 135 lb 9.6 oz (61.5 kg)  10/06/24 135 lb 12.8 oz (61.6 kg)  06/23/24 130 lb 14.4 oz (59.4 kg)     GEN: Well nourished, well developed in no acute distress HEENT: Normal NECK: No JVD; No carotid bruits LYMPHATICS: No lymphadenopathy CARDIAC: S1S2 noted,RRR, no murmurs, rubs, gallops RESPIRATORY:  Clear to auscultation without rales, wheezing or rhonchi  ABDOMEN: Soft, non-tender, non-distended, +bowel sounds, no guarding. 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 Nonrheumatic aortic valve stenosis   3. Essential hypertension     PLAN:    She is doing well from a CV standpoint.  Blood pressure is acceptable, continue with current antihypertensive regimen. No need for an echo at this time- will continue to monitor mild aortic stenosis   The patient is in agreement with the above plan. The patient left the office in stable condition.  The patient will follow up in 6 months or sooner if needed.   Medication Adjustments/Labs and Tests Ordered: Current medicines are reviewed at length with the patient today.  Concerns regarding medicines are outlined above.  No orders of the defined types were placed in this encounter.  No orders of the defined types were placed in this encounter.   Patient Instructions  Medication Instructions:  Your physician recommends that you continue on your current medications as directed. Please refer to the Current Medication list given to you today.  *If you need a refill on your cardiac medications before your next appointment, please call your pharmacy*  Follow-Up: At [facility], you and your health needs are our priority.  As part of our continuing mission to provide you with exceptional heart care, our providers are all part of one team.  This team includes your primary Cardiologist (physician) and Advanced Practice Providers or APPs (Physician Assistants and Nurse Practitioners) who all work together to provide you with the care you need, when you need it.  Your next appointment:   1 year(s)  Provider:   Taedyn Glasscock, DO      Adopting a Healthy Lifestyle.  Know what a healthy weight is for you (roughly BMI <25) and aim to maintain this   Aim for 7+ servings of fruits and vegetables daily   65-80+ fluid ounces of water or unsweet tea for healthy kidneys   Limit to max 1 drink  of alcohol per day; avoid smoking/tobacco   Limit animal fats in diet for cholesterol and heart health - choose grass fed whenever available   Avoid highly processed foods, and foods high in saturated/trans fats   Aim for low stress - take time to unwind and care for your mental health   Aim for 150 min  of moderate intensity exercise weekly for heart health, and weights twice weekly for bone health   Aim for 7-9 hours of sleep daily   When it comes to diets, agreement about the perfect plan isnt easy to find, even among the experts. Experts at the [employer] developed an idea known as the Healthy Eating Plate. Just imagine a plate divided into logical, healthy portions.   The emphasis is on diet quality:   Load up on vegetables and fruits - one-half of your plate: Aim for color and variety, and remember that potatoes dont count.   Go for whole grains - one-quarter of your plate: Whole wheat, barley, wheat berries, quinoa, oats, brown rice, and foods made with them. If you want pasta, go with whole wheat pasta.   Protein power - one-quarter of your plate: Fish, chicken, beans, and nuts are all healthy, versatile protein sources. Limit red meat.   The diet, however, does go beyond the plate, offering a few other suggestions.   Use healthy plant oils, such as olive, canola, soy, corn, sunflower and peanut. Check the labels, and avoid partially hydrogenated oil, which have unhealthy trans fats.   If youre thirsty, drink water. Coffee and tea are good in moderation, but skip sugary drinks and limit milk and dairy products to one or two daily servings.   The type of carbohydrate in the diet is more important than the amount. Some sources of carbohydrates, such as vegetables, fruits, whole grains, and beans-are healthier than others.   Finally, stay active  Signed, Dub Huntsman, DO  10/08/2024 2:36 PM    [facility]

## 2024-10-07 NOTE — Patient Instructions (Signed)

## 2025-01-26 ENCOUNTER — Inpatient Hospital Stay

## 2025-02-02 ENCOUNTER — Inpatient Hospital Stay: Admitting: Internal Medicine
